# Patient Record
Sex: Male | Born: 1990 | Race: White | Hispanic: No | Marital: Single | Smoking: Never smoker
Health system: Southern US, Community
[De-identification: ages and names within clinical notes are randomized; demographics above are authoritative.]

## PROBLEM LIST (undated history)

## (undated) VITALS — BP 137/96 | HR 92 | Temp 97.9°F | Resp 18 | Ht 73.0 in | Wt 214.0 lb

## (undated) DIAGNOSIS — T1491XA Suicide attempt, initial encounter: Secondary | ICD-10-CM

## (undated) DIAGNOSIS — U071 COVID-19: Secondary | ICD-10-CM

## (undated) DIAGNOSIS — I10 Essential (primary) hypertension: Secondary | ICD-10-CM

## (undated) DIAGNOSIS — F1020 Alcohol dependence, uncomplicated: Secondary | ICD-10-CM

## (undated) DIAGNOSIS — F419 Anxiety disorder, unspecified: Secondary | ICD-10-CM

## (undated) HISTORY — DX: Alcohol dependence, uncomplicated: F10.20

---

## 2011-09-18 ENCOUNTER — Emergency Department (HOSPITAL_COMMUNITY)
Admission: EM | Admit: 2011-09-18 | Discharge: 2011-09-19 | Disposition: A | Discharge status: Home / Self Care | Payer: BC Managed Care – PPO | Attending: Emergency Medicine | Admitting: Emergency Medicine

## 2011-09-18 DIAGNOSIS — F10229 Alcohol dependence with intoxication, unspecified: Secondary | ICD-10-CM | POA: Insufficient documentation

## 2011-09-18 DIAGNOSIS — F411 Generalized anxiety disorder: Secondary | ICD-10-CM | POA: Insufficient documentation

## 2011-09-18 DIAGNOSIS — J45909 Unspecified asthma, uncomplicated: Secondary | ICD-10-CM | POA: Insufficient documentation

## 2011-09-18 LAB — RAPID URINE DRUG SCREEN, HOSP PERFORMED
Barbiturates: NOT DETECTED
Benzodiazepines: NOT DETECTED
Cocaine: NOT DETECTED
Opiates: NOT DETECTED

## 2011-09-18 LAB — COMPREHENSIVE METABOLIC PANEL
Albumin: 4.1 g/dL (ref 3.5–5.2)
Alkaline Phosphatase: 56 U/L (ref 39–117)
BUN: 10 mg/dL (ref 6–23)
Calcium: 9.4 mg/dL (ref 8.4–10.5)
Potassium: 3.9 mEq/L (ref 3.5–5.1)
Sodium: 140 mEq/L (ref 135–145)
Total Protein: 7.9 g/dL (ref 6.0–8.3)

## 2011-09-18 LAB — DIFFERENTIAL
Basophils Absolute: 0 10*3/uL (ref 0.0–0.1)
Basophils Relative: 0 % (ref 0–1)
Eosinophils Relative: 0 % (ref 0–5)
Monocytes Absolute: 0.5 10*3/uL (ref 0.1–1.0)

## 2011-09-18 LAB — ETHANOL: Alcohol, Ethyl (B): 174 mg/dL — ABNORMAL HIGH (ref 0–11)

## 2011-09-18 LAB — CBC
MCH: 32.1 pg (ref 26.0–34.0)
MCHC: 36.3 g/dL — ABNORMAL HIGH (ref 30.0–36.0)
RDW: 12.8 % (ref 11.5–15.5)

## 2011-09-20 ENCOUNTER — Inpatient Hospital Stay (HOSPITAL_COMMUNITY)
Admission: RE | Admit: 2011-09-20 | Discharge: 2011-09-22 | DRG: 897 | Disposition: A | Discharge status: Home / Self Care | Payer: No Typology Code available for payment source | Source: Ambulatory Visit | Attending: Psychiatry | Admitting: Psychiatry

## 2011-09-20 DIAGNOSIS — F1020 Alcohol dependence, uncomplicated: Secondary | ICD-10-CM

## 2011-09-20 DIAGNOSIS — Z560 Unemployment, unspecified: Secondary | ICD-10-CM

## 2011-09-20 DIAGNOSIS — R030 Elevated blood-pressure reading, without diagnosis of hypertension: Secondary | ICD-10-CM

## 2011-09-20 DIAGNOSIS — R7401 Elevation of levels of liver transaminase levels: Secondary | ICD-10-CM

## 2011-09-20 DIAGNOSIS — Z6379 Other stressful life events affecting family and household: Secondary | ICD-10-CM

## 2011-09-20 DIAGNOSIS — R7402 Elevation of levels of lactic acid dehydrogenase (LDH): Secondary | ICD-10-CM

## 2011-09-22 NOTE — Assessment & Plan Note (Signed)
[name], [name] NO.:  [id number]  MEDICAL RECORD NO.:  [id number]  LOCATION:  0303                          FACILITY:  [facility]  PHYSICIAN:  Orson Aloe, MD       DATE OF BIRTH:  12/29/1990  DATE OF ADMISSION:  09/20/2011 DATE OF DISCHARGE:                      PSYCHIATRIC ADMISSION ASSESSMENT   IDENTIFYING INFORMATION:  Single 20 year old male.  This is a voluntary admission.  CHIEF COMPLAINT:  "Want to stop drinking."  HISTORY OF PRESENT ILLNESS:  This is the first inpatient admission and 1st detox for [name] who reports that he has been drinking pretty regularly for about 2 years now.  He drinks daily most days of the week, has lost his job, feels that the alcohol affects his mood by amplifying whatever mood he happens to be in, based on the current circumstances. No recent legal charges.  He denies any suicidal thoughts.  His typical day involves drinking a 4 loco alcohol drink in the afternoon, then ends up going to the bar with his friends where he drinks 6-7 beers along with some shots.  If they are not at the bar, they will typically split a 5th of vodka.  His last drink was on the morning of October 13 which was the day of presentation in the emergency room.  He presented with an alcohol level of 174.  At that time, his pulse was elevated at 120 beats per minute, blood pressure 154/94.  [name] says that he tried to stopped drinking on his own, but gets rapid heart rate, very flushed, feeling cold and hot all at the same time, very anxious and jittery, and that frightened him when he started drinking again.  PAST PSYCHIATRIC HISTORY:  He has been followed by his primary care physician, Dr. Tana Coast, MD.  He was previously treated with Zoloft for anxiety that he felt was quite effective.  He eventually had been off of it for about a year, went to his doctor and has gotten back on it.  He is now just taking 25 mg with plans to titrate  up.  SOCIAL HISTORY:  Single Caucasian male who is currently unemployed. Currently, lives with a friend and plans on moving in with his girlfriend who drinks only occasionally.  He reports that he does have adequate support group of friends, some of whom do drink and plenty of others who do not use alcohol on a regular basis.  He has thought through what he needs to avoid alcohol and plans on avoiding the bar scene and changing friends that he is currently involved with.  SOCIAL HISTORY:  No current legal charges.  FAMILY HISTORY:  Many members of his mother's family drinking heavily.  ALCOHOL AND DRUG HISTORY:  He denies any substance abuse other than the above.  No use of benzodiazepines.  MEDICAL HISTORY:  Primary care physician is Tana Coast, MD.  MEDICAL PROBLEMS:  Rule out elevated blood pressure secondary to alcohol withdrawal and rule out alcohol-induced transaminitis.  PAST SURGERY HISTORY:  Significant for no seizures, no surgeries.  CURRENT MEDICATIONS:  Zoloft 50 mg daily.  DRUG ALLERGIES:  none.  PHYSICAL EXAM:  Done in the emergency room  and is noted in the record, along with a full review of systems.  Today, he denies any feelings of anxiety.  He is receiving a detox medication and received 2 mg of Ativan in the emergency room.  Admitting vital signs:  Temp 97.9, pulse 91, respirations 16, blood pressure 143/93.  Initial admitting vital signs pulse was 120, blood pressure 154/94.  He weighs 74 kg and is 6 feet tall.  CBC is normal.  Platelets 171.  Normal electrolytes.  Liver enzymes are mildly elevated with SGOT of 71, SGPT 33, alkaline phosphatase 56, total bilirubin is 1.0.  Lipase negative at 41.  Urine drug screen negative for all substances.  MENTAL STATUS EXAM:  Fully alert male pleasant, cooperative, good eye contact, dressed appropriately.  He does have a many colorful, elaborate tattoos over many surfaces.  Calm and cooperative.  Gives a  coherent history.  Affect is a bit the blunt.  Speech is soft, but coherent, logical, no evidence of delirium or confusion.  Has been thinking through how to stay off the alcohol.  Says that he does plan for sobriety.  Denies that he has had any current or past suicidal thoughts. Insight is adequate.  Impulse control and judgment normal.  ADMISSION DIAGNOSES:  AXIS I:  Alcohol dependence. AXIS II:  Deferred. AXIS III:  Rule out elevated blood pressure secondary to alcohol abuse, rule out alcohol-induced transaminitis. AXIS IV:  Problem with joblessness. AXIS V:  Current is 46, past year not known.  PLAN:  Voluntarily admit him with a goal of a safe detox in 4 days and we have started him on a Librium detox protocol and explained the use of the p.r.n. medications and the course of the protocol.  He is interested in hearing more about community support systems for sobriety and plans on returning to his own apartment.  Participation in group therapy has been good.     Margaret A. Lorin Picket, N.P.   ______________________________ Orson Aloe, MD    MAS/MEDQ  D:  09/20/2011  T:  09/21/2011  Job:  [id number]  Electronically Signed by Kari Baars N.P. on 09/21/2011 01:06:07 PM Electronically Signed by Orson Aloe  on 09/22/2011 04:04:20 PM

## 2011-09-24 ENCOUNTER — Other Ambulatory Visit (HOSPITAL_COMMUNITY): Payer: No Typology Code available for payment source | Attending: Physician Assistant

## 2011-09-24 DIAGNOSIS — F1020 Alcohol dependence, uncomplicated: Secondary | ICD-10-CM | POA: Insufficient documentation

## 2011-09-24 NOTE — Discharge Summary (Signed)
NAMEKENTARO, ALEWINE                ACCOUNT NO.:  [id number]  MEDICAL RECORD NO.:  [id number]  LOCATION:  0303                          FACILITY:  [facility]  PHYSICIAN:  Orson Aloe, MD       DATE OF BIRTH:  04/03/1991  DATE OF ADMISSION:  09/19/2011 DATE OF DISCHARGE:  09/22/2011                              DISCHARGE SUMMARY   IDENTIFYING INFORMATION:  This is a 20 year old male.  This is a voluntary admission.  HISTORY OF THE PRESENT ILLNESS:  This is the first inpatient psychiatric admission and first detox for Dollie who reported that he had been drinking alcohol regularly for about 2 years now.  He says he drinks alcohol most days of the week and has lost his job and feels that the alcohol is also affecting his mood.  It tends to amplify whatever mood he happens to be in at the time he is drinking.  He regularly drinks to intoxication and will typically drink a Four Loko drink in the afternoon then goes to the bar with his friends where he drinks 6-7 beers along with some shots.  If they are not at a bar, they will typically split a 5th of vodka.  He presented with an alcohol level of 174 mg/dL on the afternoon of presentation after drinking in the morning.  At that time, pulse was elevated at 120 beats per minute, blood pressure 154/94. Palmer says that he tries to stop drinking on his own but gets a rapid heart rate, feeling very flushed, cold and hot all at the same time, very anxious and jittery.  He feels that the alcohol is affecting him physically, and this is another reason for pursuing sobriety.  He endorses a history of anxiety that was previously treated with Zoloft, and he was recently started back on it, taking 25 mg daily.  Denies any suicidal thoughts.  MEDICAL EVALUATION:  He was medically evaluated in the emergency room, and full physical exam and review of systems is noted in the record.  MEDICAL PROBLEMS AT THE TIME OF ADMISSION:  Included elevated  blood pressure and pulse as noted above and mildly elevated liver enzymes.  He weighs 74 kg, 6 feet tall.  CBC was normal, platelets 171,000. Electrolytes normal.  Liver enzymes mildly elevated with an SGOT of 71, SGPT of 33, alkaline phosphatase of 56 and total bilirubin of 1.0. Urine drug screen negative for all substances.  COURSE OF HOSPITALIZATION:  He was admitted to our detox unit and started on a Librium protocol with the goal of a safe detox in 4 days. His detox was uneventful.  Following initiation of the Librium, his blood pressure was down to 127/86 and pulse down to 90.  He tolerated the Librium well.  Participation in group therapy and unit activities was appropriate.  He was ready for discharge by the 17th of October.  He continues to consistently deny any dangerous ideas.  FOLLOWUP PLAN:  He will follow up with the intensive outpatient program for chemical dependency here at [facility] on October the 18th at 9 a.m.  DISCHARGE MEDICATIONS:  Librium 25 mg take 1 at bedtime on October 17th and  1 at bedtime on October 18th, and he is finished with detox.  DISCHARGE DIAGNOSES:  Axis I:  Alcohol dependence. Axis II:  Deferred. Axis III.  Mildly elevated transaminases attributed to alcohol abuse. Axis IV:  Moderate psychosocial issues. Axis V:  Current 55, past year 65 estimated.     Margaret A. Lorin Picket, N.P.   ______________________________ Orson Aloe, MD    MAS/MEDQ  D:  09/22/2011  T:  09/22/2011  Job:  [id number]  Electronically Signed by Kari Baars N.P. on 09/23/2011 08:27:42 AM Electronically Signed by Orson Aloe  on 09/24/2011 10:22:58 AM

## 2011-09-27 ENCOUNTER — Other Ambulatory Visit (HOSPITAL_COMMUNITY): Payer: No Typology Code available for payment source

## 2011-09-29 ENCOUNTER — Other Ambulatory Visit (HOSPITAL_COMMUNITY): Payer: No Typology Code available for payment source

## 2011-10-01 ENCOUNTER — Other Ambulatory Visit (HOSPITAL_COMMUNITY): Payer: No Typology Code available for payment source

## 2011-10-04 ENCOUNTER — Other Ambulatory Visit (HOSPITAL_COMMUNITY): Payer: No Typology Code available for payment source

## 2011-10-06 ENCOUNTER — Other Ambulatory Visit (HOSPITAL_COMMUNITY): Payer: No Typology Code available for payment source

## 2011-10-08 ENCOUNTER — Other Ambulatory Visit (HOSPITAL_COMMUNITY): Payer: BC Managed Care – PPO | Attending: Physician Assistant

## 2011-10-11 ENCOUNTER — Other Ambulatory Visit (HOSPITAL_COMMUNITY): Payer: BC Managed Care – PPO | Admitting: *Deleted

## 2011-10-11 NOTE — Progress Notes (Signed)
  Patient was contacted by phone on 10/06/11 regarding his absence from group and his no-show for individual session. He was also contacted by Charmian Muff, LCAS on 10/08/11 regarding his absence.

## 2011-10-11 NOTE — Progress Notes (Deleted)
    Daily Group Progress Note  Program: {CHL AMB [facility] IOP/CDIOP Program Type:21022744}  Group Time:   Participation Level: {CHL AMB [facility] Group Participation:21022742}  Behavioral Response: {CHL AMB [facility] Group Behavior:21022743}  Type of Therapy:  {CHL AMB [facility] Type of Therapy:21022741}  Summary of Progress: ***     Group Time:   Participation Level:  {CHL AMB [facility] Group Participation:21022742}  Behavioral Response: {CHL AMB [facility] Group Behavior:21022743}  Type of Therapy: {CHL AMB BH Type of Therapy:21022741}  Summary of Progress: ***  Aris Lot, COUNS

## 2011-10-13 ENCOUNTER — Other Ambulatory Visit (HOSPITAL_COMMUNITY): Payer: BC Managed Care – PPO | Admitting: *Deleted

## 2011-10-14 ENCOUNTER — Encounter (HOSPITAL_COMMUNITY): Payer: Self-pay | Admitting: *Deleted

## 2011-10-14 NOTE — Progress Notes (Signed)
  Pt discharged from Chemical Dependency Program due to non-compliance. He no-showed 4 groups in a row and did not return calls regarding absence.

## 2011-10-15 ENCOUNTER — Other Ambulatory Visit (HOSPITAL_COMMUNITY): Payer: BC Managed Care – PPO

## 2011-10-18 ENCOUNTER — Other Ambulatory Visit (HOSPITAL_COMMUNITY): Payer: BC Managed Care – PPO

## 2011-10-20 ENCOUNTER — Other Ambulatory Visit (HOSPITAL_COMMUNITY): Payer: BC Managed Care – PPO

## 2011-10-22 ENCOUNTER — Other Ambulatory Visit (HOSPITAL_COMMUNITY): Payer: BC Managed Care – PPO

## 2011-10-25 ENCOUNTER — Other Ambulatory Visit (HOSPITAL_COMMUNITY): Payer: BC Managed Care – PPO

## 2011-10-27 ENCOUNTER — Other Ambulatory Visit (HOSPITAL_COMMUNITY): Payer: BC Managed Care – PPO

## 2011-10-29 ENCOUNTER — Other Ambulatory Visit (HOSPITAL_COMMUNITY): Payer: BC Managed Care – PPO

## 2011-11-01 ENCOUNTER — Other Ambulatory Visit (HOSPITAL_COMMUNITY): Payer: BC Managed Care – PPO

## 2011-11-03 ENCOUNTER — Other Ambulatory Visit (HOSPITAL_COMMUNITY): Payer: BC Managed Care – PPO

## 2011-11-05 ENCOUNTER — Other Ambulatory Visit (HOSPITAL_COMMUNITY): Payer: BC Managed Care – PPO

## 2011-11-08 ENCOUNTER — Other Ambulatory Visit (HOSPITAL_COMMUNITY): Payer: BC Managed Care – PPO | Attending: Physician Assistant

## 2011-11-10 ENCOUNTER — Other Ambulatory Visit (HOSPITAL_COMMUNITY): Payer: BC Managed Care – PPO

## 2011-11-12 ENCOUNTER — Other Ambulatory Visit (HOSPITAL_COMMUNITY): Payer: BC Managed Care – PPO

## 2011-11-15 ENCOUNTER — Other Ambulatory Visit (HOSPITAL_COMMUNITY): Payer: BC Managed Care – PPO

## 2011-11-17 ENCOUNTER — Other Ambulatory Visit (HOSPITAL_COMMUNITY): Payer: BC Managed Care – PPO

## 2018-09-14 ENCOUNTER — Other Ambulatory Visit: Payer: Self-pay

## 2018-09-14 ENCOUNTER — Observation Stay (HOSPITAL_COMMUNITY)
Admission: EM | Admit: 2018-09-14 | Discharge: 2018-09-16 | Disposition: A | Discharge status: Home / Self Care | Payer: Self-pay | Attending: Internal Medicine | Admitting: Internal Medicine

## 2018-09-14 ENCOUNTER — Encounter (HOSPITAL_COMMUNITY): Payer: Self-pay | Admitting: *Deleted

## 2018-09-14 DIAGNOSIS — F10930 Alcohol use, unspecified with withdrawal, uncomplicated: Secondary | ICD-10-CM

## 2018-09-14 DIAGNOSIS — F10229 Alcohol dependence with intoxication, unspecified: Secondary | ICD-10-CM

## 2018-09-14 DIAGNOSIS — E870 Hyperosmolality and hypernatremia: Secondary | ICD-10-CM | POA: Insufficient documentation

## 2018-09-14 DIAGNOSIS — F17290 Nicotine dependence, other tobacco product, uncomplicated: Secondary | ICD-10-CM | POA: Insufficient documentation

## 2018-09-14 DIAGNOSIS — F10932 Alcohol use, unspecified with withdrawal with perceptual disturbance: Secondary | ICD-10-CM | POA: Diagnosis present

## 2018-09-14 DIAGNOSIS — F10232 Alcohol dependence with withdrawal with perceptual disturbance: Principal | ICD-10-CM | POA: Insufficient documentation

## 2018-09-14 DIAGNOSIS — F10230 Alcohol dependence with withdrawal, uncomplicated: Secondary | ICD-10-CM

## 2018-09-14 LAB — CBC
HEMATOCRIT: 44.2 % (ref 39.0–52.0)
HEMOGLOBIN: 14.2 g/dL (ref 13.0–17.0)
MCH: 28.1 pg (ref 26.0–34.0)
MCHC: 32.1 g/dL (ref 30.0–36.0)
MCV: 87.4 fL (ref 80.0–100.0)
Platelets: 158 10*3/uL (ref 150–400)
RBC: 5.06 MIL/uL (ref 4.22–5.81)
RDW: 12.6 % (ref 11.5–15.5)
WBC: 6.7 10*3/uL (ref 4.0–10.5)
nRBC: 0 % (ref 0.0–0.2)

## 2018-09-14 LAB — COMPREHENSIVE METABOLIC PANEL
ALBUMIN: 5.2 g/dL — AB (ref 3.5–5.0)
ALT: 25 U/L (ref 0–44)
AST: 28 U/L (ref 15–41)
Alkaline Phosphatase: 55 U/L (ref 38–126)
Anion gap: 13 (ref 5–15)
BUN: 9 mg/dL (ref 6–20)
CHLORIDE: 106 mmol/L (ref 98–111)
CO2: 27 mmol/L (ref 22–32)
CREATININE: 0.91 mg/dL (ref 0.61–1.24)
Calcium: 9.8 mg/dL (ref 8.9–10.3)
GFR calc Af Amer: >60 mL/min (ref >60)
GFR calc non Af Amer: >60 mL/min (ref >60)
GLUCOSE: 100 mg/dL — AB (ref 70–99)
Potassium: 4.2 mmol/L (ref 3.5–5.1)
SODIUM: 146 mmol/L — AB (ref 135–145)
Total Bilirubin: 0.9 mg/dL (ref 0.3–1.2)
Total Protein: 8.8 g/dL — ABNORMAL HIGH (ref 6.5–8.1)

## 2018-09-14 LAB — ETHANOL: Alcohol, Ethyl (B): 231 mg/dL — ABNORMAL HIGH (ref <10)

## 2018-09-14 MED ORDER — ONDANSETRON HCL 4 MG/2ML IJ SOLN
4.0000 mg | Freq: Once | INTRAMUSCULAR | Status: AC
  Administered 2018-09-14: 4 mg via INTRAVENOUS
  Filled 2018-09-14: qty 2

## 2018-09-14 MED ORDER — LORAZEPAM 1 MG PO TABS: 0.0000 mg | ORAL_TABLET | Freq: Four times a day (QID) | ORAL | Status: DC

## 2018-09-14 MED ORDER — LORAZEPAM 2 MG/ML IJ SOLN
0.0000 mg | Freq: Four times a day (QID) | INTRAMUSCULAR | Status: DC
  Administered 2018-09-15: 1 mg via INTRAVENOUS
  Filled 2018-09-14: qty 1

## 2018-09-14 MED ORDER — THIAMINE HCL 100 MG/ML IJ SOLN
Freq: Once | INTRAVENOUS | Status: AC
  Administered 2018-09-15: 01:00:00 via INTRAVENOUS
  Filled 2018-09-14: qty 1000

## 2018-09-14 NOTE — ED Provider Notes (Signed)
[facility]-EMERGENCY DEPT Provider Note: Lowella Dell, MD, FACEP  CSN: [id number] MRN: [id number] ARRIVAL: 09/14/18 at 2155 ROOM: WA03/WA03   CHIEF COMPLAINT  Alcohol Withdrawal   HISTORY OF PRESENT ILLNESS  09/14/18 11:30 PM [name] is a 27 y.o. male with a long-standing history of alcoholism.  He has been trying to go "cold [place]" for the past 3 days.  He is complaining of feeling generally weak, anxious, having difficulty concentrating and forming thoughts, having visual hallucinations which he describes as wavy lines in his peripheral vision, nausea, vomiting, decreased oral intake and a sense of impending doom.  He has been admitted for DTs in the past, about 8 years ago.  Symptoms are presently moderate as he has been drinking occasional beers throughout the day to ease his symptoms.  He denies abdominal pain or diarrhea.   Past Medical History:  Diagnosis Date  . Alcohol dependence (HCC)     History reviewed. No pertinent surgical history.  No family history on file.  Social History   Tobacco Use  . Smoking status: Current Every Day Smoker    Types: E-cigarettes  . Smokeless tobacco: Never Used  Substance Use Topics  . Alcohol use: Yes    Alcohol/week: 70.0 standard drinks    Types: 70 Standard drinks or equivalent per week  . Drug use: Not on file    Prior to Admission medications   Medication Sig Start Date End Date Taking? Authorizing Provider  amphetamine-dextroamphetamine (ADDERALL) 10 MG tablet Take 10 mg by mouth once.    Yes [provider]    Allergies Patient has no known allergies.   REVIEW OF SYSTEMS  Negative except as noted here or in the History of Present Illness.   PHYSICAL EXAMINATION  Initial Vital Signs Blood pressure (!) 138/96, pulse 92, height 6\' 1"  (1.854 m), weight 83.6 kg.  Examination General: Well-developed, well-nourished male in no acute distress; appearance consistent with age of record HENT: normocephalic;  atraumatic; breath smells of alcohol Eyes: pupils equal, round and reactive to light; extraocular muscles intact Neck: supple Heart: regular rate and rhythm Lungs: clear to auscultation bilaterally Abdomen: soft; nondistended; nontender; no masses or hepatosplenomegaly; bowel sounds present Extremities: No deformity; full range of motion; pulses normal Neurologic: Awake, alert and oriented; motor function intact in all extremities and symmetric; no facial droop Skin: Warm and dry Psychiatric: Flat affect   RESULTS  Summary of this visit's results, reviewed by myself:   EKG Interpretation  Date/Time:    Ventricular Rate:    PR Interval:    QRS Duration:   QT Interval:    QTC Calculation:   R Axis:     Text Interpretation:        Laboratory Studies: Results for orders placed or performed during the hospital encounter of 09/14/18 (from the past 24 hour(s))  Comprehensive metabolic panel     Status: Abnormal   Collection Time: 09/14/18 11:11 PM  Result Value Ref Range   Sodium 146 (H) 135 - 145 mmol/L   Potassium 4.2 3.5 - 5.1 mmol/L   Chloride 106 98 - 111 mmol/L   CO2 27 22 - 32 mmol/L   Glucose, Bld 100 (H) 70 - 99 mg/dL   BUN 9 6 - 20 mg/dL   Creatinine, Ser 7.82 0.61 - 1.24 mg/dL   Calcium 9.8 8.9 - 95.6 mg/dL   Total Protein 8.8 (H) 6.5 - 8.1 g/dL   Albumin 5.2 (H) 3.5 - 5.0 g/dL   AST  28 15 - 41 U/L   ALT 25 0 - 44 U/L   Alkaline Phosphatase 55 38 - 126 U/L   Total Bilirubin 0.9 0.3 - 1.2 mg/dL   GFR calc non Af Amer >60 >60 mL/min   GFR calc Af Amer >60 >60 mL/min   Anion gap 13 5 - 15  Ethanol     Status: Abnormal   Collection Time: 09/14/18 11:11 PM  Result Value Ref Range   Alcohol, Ethyl (B) 231 (H) <10 mg/dL  cbc     Status: None   Collection Time: 09/14/18 11:11 PM  Result Value Ref Range   WBC 6.7 4.0 - 10.5 K/uL   RBC 5.06 4.22 - 5.81 MIL/uL   Hemoglobin 14.2 13.0 - 17.0 g/dL   HCT 66.4 40.3 - 47.4 %   MCV 87.4 80.0 - 100.0 fL   MCH 28.1 26.0  - 34.0 pg   MCHC 32.1 30.0 - 36.0 g/dL   RDW 25.9 56.3 - 87.5 %   Platelets 158 150 - 400 K/uL   nRBC 0.0 0.0 - 0.2 %  Rapid urine drug screen (hospital performed)     Status: Abnormal   Collection Time: 09/15/18 12:40 AM  Result Value Ref Range   Opiates NONE DETECTED NONE DETECTED   Cocaine NONE DETECTED NONE DETECTED   Benzodiazepines NONE DETECTED NONE DETECTED   Amphetamines POSITIVE (A) NONE DETECTED   Tetrahydrocannabinol NONE DETECTED NONE DETECTED   Barbiturates NONE DETECTED NONE DETECTED   Imaging Studies: No results found.  ED COURSE and MDM  Nursing notes and initial vitals signs, including pulse oximetry, reviewed.  Vitals:   09/14/18 2255 09/14/18 2301 09/15/18 0025 09/15/18 0030  BP:  (!) 138/96 130/87 119/83  Pulse:  92 81 83  Resp:    19  SpO2:    98%  Weight: 83.6 kg     Height: 6\' 1"  (1.854 m)      1:24 AM Patient is not in frank DTs at the present time but his alcohol is 231 and I am concerned that he will go into DTs as his alcohol is metabolized.  I will consult the hospitalist for admission.  PROCEDURES    ED DIAGNOSES     ICD-10-CM   1. Alcohol withdrawal syndrome without complication (HCC) F10.230   2. Alcohol intoxication in active alcoholic with complication (HCC) I43.329        Paula Libra, MD 09/15/18 [phone]

## 2018-09-14 NOTE — ED Triage Notes (Signed)
Pt reports withdrawal sxs from etoh.  Last drink was about 5 hours ago.  Normally drinks more than 12 beers daily.  He reports panic attacks and confusion today.  He has not been able to eat or drink/.  He is A&Ox 4.  Ambulatory without difficulty.

## 2018-09-15 ENCOUNTER — Encounter (HOSPITAL_COMMUNITY): Payer: Self-pay | Admitting: Family Medicine

## 2018-09-15 ENCOUNTER — Other Ambulatory Visit: Payer: Self-pay

## 2018-09-15 DIAGNOSIS — F10232 Alcohol dependence with withdrawal with perceptual disturbance: Secondary | ICD-10-CM

## 2018-09-15 DIAGNOSIS — F10932 Alcohol use, unspecified with withdrawal with perceptual disturbance: Secondary | ICD-10-CM | POA: Diagnosis present

## 2018-09-15 LAB — COMPREHENSIVE METABOLIC PANEL
ALBUMIN: 4 g/dL (ref 3.5–5.0)
ALT: 22 U/L (ref 0–44)
ANION GAP: 10 (ref 5–15)
AST: 24 U/L (ref 15–41)
Alkaline Phosphatase: 46 U/L (ref 38–126)
BILIRUBIN TOTAL: 0.9 mg/dL (ref 0.3–1.2)
BUN: 9 mg/dL (ref 6–20)
CALCIUM: 8.9 mg/dL (ref 8.9–10.3)
CO2: 26 mmol/L (ref 22–32)
Chloride: 109 mmol/L (ref 98–111)
Creatinine, Ser: 0.89 mg/dL (ref 0.61–1.24)
GFR calc Af Amer: >60 mL/min (ref >60)
GFR calc non Af Amer: >60 mL/min (ref >60)
GLUCOSE: 84 mg/dL (ref 70–99)
POTASSIUM: 4.1 mmol/L (ref 3.5–5.1)
SODIUM: 145 mmol/L (ref 135–145)
Total Protein: 7.2 g/dL (ref 6.5–8.1)

## 2018-09-15 LAB — RAPID URINE DRUG SCREEN, HOSP PERFORMED
Amphetamines: POSITIVE — AB
Barbiturates: NOT DETECTED
Benzodiazepines: NOT DETECTED
Cocaine: NOT DETECTED
OPIATES: NOT DETECTED
Tetrahydrocannabinol: NOT DETECTED

## 2018-09-15 LAB — HIV ANTIBODY (ROUTINE TESTING W REFLEX): HIV Screen 4th Generation wRfx: NONREACTIVE

## 2018-09-15 MED ORDER — VITAMIN B-1 100 MG PO TABS
100.0000 mg | ORAL_TABLET | Freq: Every day | ORAL | Status: DC
  Administered 2018-09-15 – 2018-09-16 (×2): 100 mg via ORAL
  Filled 2018-09-15 (×2): qty 1

## 2018-09-15 MED ORDER — ACETAMINOPHEN 325 MG PO TABS: 650.0000 mg | ORAL_TABLET | Freq: Four times a day (QID) | ORAL | Status: DC | PRN

## 2018-09-15 MED ORDER — THIAMINE HCL 100 MG/ML IJ SOLN: 100.0000 mg | Freq: Every day | INTRAMUSCULAR | Status: DC

## 2018-09-15 MED ORDER — IBUPROFEN 200 MG PO TABS: 400.0000 mg | ORAL_TABLET | Freq: Four times a day (QID) | ORAL | Status: DC | PRN

## 2018-09-15 MED ORDER — SENNOSIDES-DOCUSATE SODIUM 8.6-50 MG PO TABS: 1.0000 | ORAL_TABLET | Freq: Every evening | ORAL | Status: DC | PRN

## 2018-09-15 MED ORDER — ADULT MULTIVITAMIN W/MINERALS CH
1.0000 | ORAL_TABLET | Freq: Every day | ORAL | Status: DC
  Administered 2018-09-15 – 2018-09-16 (×2): 1 via ORAL
  Filled 2018-09-15 (×2): qty 1

## 2018-09-15 MED ORDER — ONDANSETRON HCL 4 MG/2ML IJ SOLN: 4.0000 mg | Freq: Four times a day (QID) | INTRAMUSCULAR | Status: DC | PRN

## 2018-09-15 MED ORDER — LORAZEPAM 2 MG/ML IJ SOLN: 1.0000 mg | Freq: Four times a day (QID) | INTRAMUSCULAR | Status: DC | PRN

## 2018-09-15 MED ORDER — LORAZEPAM 2 MG/ML IJ SOLN: 0.0000 mg | Freq: Two times a day (BID) | INTRAMUSCULAR | Status: DC

## 2018-09-15 MED ORDER — SODIUM CHLORIDE 0.45 % IV SOLN
INTRAVENOUS | Status: AC
  Administered 2018-09-15: 03:00:00 via INTRAVENOUS

## 2018-09-15 MED ORDER — FOLIC ACID 1 MG PO TABS
1.0000 mg | ORAL_TABLET | Freq: Every day | ORAL | Status: DC
  Administered 2018-09-15 – 2018-09-16 (×2): 1 mg via ORAL
  Filled 2018-09-15 (×2): qty 1

## 2018-09-15 MED ORDER — ENOXAPARIN SODIUM 40 MG/0.4ML ~~LOC~~ SOLN
40.0000 mg | SUBCUTANEOUS | Status: DC
  Filled 2018-09-15: qty 0.4

## 2018-09-15 MED ORDER — LORAZEPAM 1 MG PO TABS
1.0000 mg | ORAL_TABLET | Freq: Four times a day (QID) | ORAL | Status: DC | PRN
  Administered 2018-09-15 – 2018-09-16 (×4): 1 mg via ORAL
  Filled 2018-09-15 (×4): qty 1

## 2018-09-15 MED ORDER — ONDANSETRON HCL 4 MG PO TABS: 4.0000 mg | ORAL_TABLET | Freq: Four times a day (QID) | ORAL | Status: DC | PRN

## 2018-09-15 MED ORDER — ACETAMINOPHEN 650 MG RE SUPP: 650.0000 mg | Freq: Four times a day (QID) | RECTAL | Status: DC | PRN

## 2018-09-15 MED ORDER — LORAZEPAM 2 MG/ML IJ SOLN
0.0000 mg | Freq: Four times a day (QID) | INTRAMUSCULAR | Status: DC
  Administered 2018-09-15 (×2): 1 mg via INTRAVENOUS
  Administered 2018-09-16: 2 mg via INTRAVENOUS
  Filled 2018-09-15 (×3): qty 1

## 2018-09-15 NOTE — Clinical Social Work Note (Addendum)
Clinical Social Work Assessment  Patient Details  Name: [name] MRN: [id number] Date of Birth: 20-Apr-1991  Date of referral:  09/15/18               Reason for consult:  Substance Use/ETOH Abuse                Permission sought to share information with:    Permission granted to share information::  No  Name::        Agency::     Relationship::     Contact Information:     Housing/Transportation Living arrangements for the past 2 months:  Single Family Home Source of Information:  Patient Patient Interpreter Needed:  None Criminal Activity/Legal Involvement Pertinent to Current Situation/Hospitalization:  No - Comment as needed Significant Relationships:  Parents, Significant Other Lives with:    Do you feel safe going back to the place where you live?  Yes Need for family participation in patient care:  No (Coment)  Care giving concerns:  Patient has long history of ETOH abuse and desires to quit.   Social Worker assessment / plan:  CSW met with patient at beside to discuss patient's current ETOH abuse. Patient reports he has long history of ETOH abuse and desires to quit drinking altogether. He expresses being tired of consuming at the level he currently does and wants to stop.  Patient has been to residential treatment before for "6-7 days" and reports it was helpful at the time but that was multiple years ago. He could not report what has helped him in sobriety previously.   Patient appears motivated to make a change in ETOH consumption.  CSW discussed residential treatment, outpatient treatment, and provided list of AA meetings to patient. CSW alerted patient of treatment options that take patients with no insurance.   Patient was appreciative and had no additional questions.   Employment status:    Insurance information:  Self Pay (Medicaid Pending) PT Recommendations:  Not assessed at this time Information / Referral to community resources:  Residential Substance  Abuse Treatment Options, Outpatient Substance Abuse Treatment Options  Patient/Family's Response to care: Patient was receptive to CSW and accepting of substance abuse resources.  Patient/Family's Understanding of and Emotional Response to Diagnosis, Current Treatment, and Prognosis:  Patient understands treatment options and that he should call and reach out to treatment facilities that he is interested in. CSW provided list with information for each option.  Emotional Assessment Appearance:  Appears stated age Attitude/Demeanor/Rapport:    Affect (typically observed):  Accepting Orientation:  Oriented to  Time, Oriented to Situation, Oriented to Place, Oriented to Self Alcohol / Substance use:  Alcohol Use Psych involvement (Current and /or in the community):  No (Comment)  Discharge Needs  Concerns to be addressed:  Substance Abuse Concerns Readmission within the last 30 days:  No Current discharge risk:  Substance Abuse Barriers to Discharge:  Active Substance Use   Pricilla Holm, LCSWA 09/15/2018, 11:20 AM

## 2018-09-15 NOTE — H&P (Signed)
History and Physical    [name] CHY:[id number] DOB: [birth year] DOA: 09/14/2018  PCP: Arman Filter, FNP   Patient coming from: Home   Chief Complaint: Anxiety, N/V, hallucinations   HPI: [name] is a 27 y.o. male with medical history significant for alcohol dependence with history of withdrawal seizure, now presenting to the emergency department with anxiety, nausea with vomiting, and visual hallucinations in the setting of trying to cut back on his alcohol intake.  Patient reports that he has been drinking excessively for roughly 10 years, had been able to quit previously, but also reports a history of withdrawal seizure.  Reports that he would like to cut back on his alcohol intake, but his attempts have been met with panic attacks, nausea with nonbloody vomiting, and he has also been experiencing some visual hallucinations today, described as seeing wavy lines.  He denies any recent fevers, chills, or chest pains.  Denies shortness of breath or cough.  Denies illicit drug use per se, but some Adderall obtained from a friend recently.  ED Course: Upon arrival to the ED, patient is found to be saturating well on room air, and with vitals otherwise stable.  Chemistry panel is notable for slight hypernatremia and elevations in albumin and serum protein.  CBC is unremarkable.  UDS is positive for methamphetamine.  Ethanol level is 231.  Patient was given IV fluids and Ativan in the ED.  He reports some improvement with this, but remains anxious and tremulous and he will be observed in the hospital for ongoing evaluation and management of alcohol withdrawal.  Review of Systems:  All other systems reviewed and apart from HPI, are negative.  Past Medical History:  Diagnosis Date  . Alcohol dependence ([facility])     History reviewed. No pertinent surgical history.   reports that he has been smoking e-cigarettes. He has never used smokeless tobacco. He reports that he drinks about  70.0 standard drinks of alcohol per week. His drug history is not on file.  No Known Allergies  History reviewed. No pertinent family history.   Prior to Admission medications   Medication Sig Start Date End Date Taking? Authorizing Provider  amphetamine-dextroamphetamine (ADDERALL) 10 MG tablet Take 10 mg by mouth once.    Yes [provider]    Physical Exam: Vitals:   09/14/18 2255 09/14/18 2301 09/15/18 0025 09/15/18 0030  BP:  (!) 138/96 130/87 119/83  Pulse:  92 81 83  Resp:    19  SpO2:    98%  Weight: 83.6 kg     Height: 6' 1"  (1.854 m)       Constitutional: NAD, tremulous  Eyes: PERTLA, lids and conjunctivae normal ENMT: Mucous membranes are moist. Posterior pharynx clear of any exudate or lesions.   Neck: normal, supple, no masses, no thyromegaly Respiratory: clear to auscultation bilaterally, no wheezing, no crackles. Normal respiratory effort.  .  Cardiovascular: S1 & S2 heard, regular rate and rhythm. No extremity edema. Hyperdynamic precordium. Abdomen: No distension, no tenderness, soft. Bowel sounds normal.  Musculoskeletal: no clubbing / cyanosis. No joint deformity upper and lower extremities.    Skin: no significant rashes, lesions, ulcers. Warm, dry, well-perfused. Neurologic: CN 2-12 grossly intact. Sensation intact. Strength 5/5 in all 4 limbs. Resting tremor.  Psychiatric: Alert and oriented x 3. Anxious, but pleasant and cooperative.    Labs on Admission: I have personally reviewed following labs and imaging studies  CBC: Recent Labs  Lab 09/14/18 2311  WBC 6.7  HGB 14.2  HCT 44.2  MCV 87.4  PLT 514   Basic Metabolic Panel: Recent Labs  Lab 09/14/18 2311  NA 146*  K 4.2  CL 106  CO2 27  GLUCOSE 100*  BUN 9  CREATININE 0.91  CALCIUM 9.8   GFR: Estimated Creatinine Clearance: 136.6 mL/min (by C-G formula based on SCr of 0.91 mg/dL). Liver Function Tests: Recent Labs  Lab 09/14/18 2311  AST 28  ALT 25  ALKPHOS 55    BILITOT 0.9  PROT 8.8*  ALBUMIN 5.2*   No results for input(s): LIPASE, AMYLASE in the last 168 hours. No results for input(s): AMMONIA in the last 168 hours. Coagulation Profile: No results for input(s): INR, PROTIME in the last 168 hours. Cardiac Enzymes: No results for input(s): CKTOTAL, CKMB, CKMBINDEX, TROPONINI in the last 168 hours. BNP (last 3 results) No results for input(s): PROBNP in the last 8760 hours. HbA1C: No results for input(s): HGBA1C in the last 72 hours. CBG: No results for input(s): GLUCAP in the last 168 hours. Lipid Profile: No results for input(s): CHOL, HDL, LDLCALC, TRIG, CHOLHDL, LDLDIRECT in the last 72 hours. Thyroid Function Tests: No results for input(s): TSH, T4TOTAL, FREET4, T3FREE, THYROIDAB in the last 72 hours. Anemia Panel: No results for input(s): VITAMINB12, FOLATE, FERRITIN, TIBC, IRON, RETICCTPCT in the last 72 hours. Urine analysis: No results found for: COLORURINE, APPEARANCEUR, LABSPEC, PHURINE, GLUCOSEU, HGBUR, BILIRUBINUR, KETONESUR, PROTEINUR, UROBILINOGEN, NITRITE, LEUKOCYTESUR Sepsis Labs: @LABRCNTIP (procalcitonin:4,lacticidven:4) )No results found for this or any previous visit (from the past 240 hour(s)).   Radiological Exams on Admission: No results found.  EKG: Not performed.   Assessment/Plan   1. Alcohol abuse with withdrawal  - Pt reports long hx of alcohol dependence with hx of withdrawal seizure, now presents with anxiety, tremor, N/V, and visual hallucinations in setting of trying to cut back on EtOH intake  - Reports feeling better after Ativan and IVF in ED, but remains anxious and tremulous  - Continue to monitor with CIWA, continue Ativan, b-vitamins, IVF hydration, as-needed antiemetics    DVT prophylaxis: Lovenox  Code Status: Full  Family Communication: Discussed with patient Consults called: None Admission status: Observation     Vianne Bulls, MD [facility] Pager [phone]  If  7PM-7AM, please contact night-coverage [email or web address] Password [email or web address]  09/15/2018, 1:43 AM

## 2018-09-15 NOTE — Progress Notes (Signed)
Patient is a 27 year old male with past medical history of chronic alcohol abuse, alcohol withdrawal seizure who presents the emergency department anxiety, nausea with vomiting and visual hallucination.  Patient was admitted for alcohol intoxication, risk of withdrawal and seizures.  His ethanol level was 231 on presentation.  UDS was also positive for methamphetamine.  Patient was started on alcohol withdrawal protocol. This morning he looks comfortable.  His vitals were stable.  He denies any complaints.  Since he has risk of alcohol withdrawal seizures, we want to monitor him for 1 more night.  Continue CIWA protocol.  Patient seen by Dr. Antionette Char this morning.

## 2018-09-15 NOTE — Progress Notes (Signed)
Chaplain following due to spiritual care consult.   [name] wished to have information around advance directive.  Chaplain provided adv. Dir. Packet and education around process for completing.  [name] wishes to look over packet and will ask nursing to page when ready to notarize.    Belva Crome MDiv, [facility]

## 2018-09-15 NOTE — ED Notes (Signed)
ED TO INPATIENT HANDOFF REPORT  Name/Age/Gender [name] 27 y.o. male  Code Status    Code Status Orders  (From admission, onward)         Start     Ordered   09/15/18 0141  Full code  Continuous     09/15/18 0143        Code Status History    This patient has a current code status but no historical code status.      Home/SNF/Other Home  Chief Complaint ETOH Withdrawals  Level of Care/Admitting Diagnosis ED Disposition    ED Disposition Condition Comment   Admit  Hospital Area: [facility] [100102]  Level of Care: Med-Surg [16]  Diagnosis: Alcohol withdrawal, with perceptual disturbance [facility]) [[id number]]  Admitting Physician: Vianne Bulls [[id number]]  Attending Physician: Vianne Bulls [[id number]]  PT Class (Do Not Modify): Observation [104]  PT Acc Code (Do Not Modify): Observation [10022]       Medical History Past Medical History:  Diagnosis Date  . Alcohol dependence ([place])     Allergies No Known Allergies  IV Location/Drains/Wounds Patient Lines/Drains/Airways Status   Active Line/Drains/Airways    Name:   Placement date:   Placement time:   Site:   Days:   Peripheral IV 09/14/18 Right Antecubital   09/14/18    2358    Antecubital   1          Labs/Imaging Results for orders placed or performed during the hospital encounter of 09/14/18 (from the past 48 hour(s))  Comprehensive metabolic panel     Status: Abnormal   Collection Time: 09/14/18 11:11 PM  Result Value Ref Range   Sodium 146 (H) 135 - 145 mmol/L   Potassium 4.2 3.5 - 5.1 mmol/L   Chloride 106 98 - 111 mmol/L   CO2 27 22 - 32 mmol/L   Glucose, Bld 100 (H) 70 - 99 mg/dL   BUN 9 6 - 20 mg/dL   Creatinine, Ser 0.91 0.61 - 1.24 mg/dL   Calcium 9.8 8.9 - 10.3 mg/dL   Total Protein 8.8 (H) 6.5 - 8.1 g/dL   Albumin 5.2 (H) 3.5 - 5.0 g/dL   AST 28 15 - 41 U/L   ALT 25 0 - 44 U/L   Alkaline Phosphatase 55 38 - 126 U/L   Total Bilirubin 0.9 0.3 - 1.2 mg/dL    GFR calc non Af Amer >60 >60 mL/min   GFR calc Af Amer >60 >60 mL/min    Comment: (NOTE) The eGFR has been calculated using the CKD EPI equation. This calculation has not been validated in all clinical situations. eGFR's persistently <60 mL/min signify possible Chronic Kidney Disease.    Anion gap 13 5 - 15    Comment: Performed at [facility], [place] [address] [place], [place] [address]  Ethanol     Status: Abnormal   Collection Time: 09/14/18 11:11 PM  Result Value Ref Range   Alcohol, Ethyl (B) 231 (H) <10 mg/dL    Comment: (NOTE) Lowest detectable limit for serum alcohol is 10 mg/dL. For medical purposes only. Performed at [facility], [place] [address] [place], [place] [address]   cbc     Status: None   Collection Time: 09/14/18 11:11 PM  Result Value Ref Range   WBC 6.7 4.0 - 10.5 K/uL   RBC 5.06 4.22 - 5.81 MIL/uL   Hemoglobin 14.2 13.0 - 17.0 g/dL   HCT 44.2 39.0 -  52.0 %   MCV 87.4 80.0 - 100.0 fL   MCH 28.1 26.0 - 34.0 pg   MCHC 32.1 30.0 - 36.0 g/dL   RDW 12.6 11.5 - 15.5 %   Platelets 158 150 - 400 K/uL   nRBC 0.0 0.0 - 0.2 %    Comment: Performed at [facility], [place] [address] [place], [place] [address]  Rapid urine drug screen (hospital performed)     Status: Abnormal   Collection Time: 09/15/18 12:40 AM  Result Value Ref Range   Opiates NONE DETECTED NONE DETECTED   Cocaine NONE DETECTED NONE DETECTED   Benzodiazepines NONE DETECTED NONE DETECTED   Amphetamines POSITIVE (A) NONE DETECTED   Tetrahydrocannabinol NONE DETECTED NONE DETECTED   Barbiturates NONE DETECTED NONE DETECTED    Comment: (NOTE) DRUG SCREEN FOR MEDICAL PURPOSES ONLY.  IF CONFIRMATION IS NEEDED FOR ANY PURPOSE, NOTIFY LAB WITHIN 5 DAYS. LOWEST DETECTABLE LIMITS FOR URINE DRUG SCREEN Drug Class                     Cutoff (ng/mL) Amphetamine and metabolites    1000 Barbiturate and metabolites    200 Benzodiazepine                  191 Tricyclics and metabolites     300 Opiates and metabolites        300 Cocaine and metabolites        300 THC                            50 Performed at [facility], [place] [address] [place], [place] [address]    No results found.  Pending Labs Unresulted Labs (From admission, onward)    Start     Ordered   09/22/18 0500  Creatinine, serum  (enoxaparin (LOVENOX)    CrCl >/= 30 ml/min)  Weekly,   R    Comments:  while on enoxaparin therapy    09/15/18 0143   09/15/18 0500  HIV antibody (Routine Testing)  Tomorrow morning,   R     09/15/18 0143   09/15/18 0500  Comprehensive metabolic panel  Tomorrow morning,   R     09/15/18 0143          Vitals/Pain Today's Vitals   09/14/18 2358 09/15/18 0025 09/15/18 0030 09/15/18 0200  BP:  130/87 119/83 (!) 115/96  Pulse:  81 83 76  Resp:   19 18  SpO2:   98% 100%  Weight:      Height:      PainSc: 0-No pain       Isolation Precautions No active isolations  Medications Medications  LORazepam (ATIVAN) tablet 1 mg (has no administration in time range)    Or  LORazepam (ATIVAN) injection 1 mg (has no administration in time range)  thiamine (VITAMIN B-1) tablet 100 mg (has no administration in time range)    Or  thiamine (B-1) injection 100 mg (has no administration in time range)  folic acid (FOLVITE) tablet 1 mg (has no administration in time range)  multivitamin with minerals tablet 1 tablet (has no administration in time range)  LORazepam (ATIVAN) injection 0-4 mg (has no administration in time range)    Followed by  LORazepam (ATIVAN) injection 0-4 mg (has no administration in time range)  enoxaparin (LOVENOX) injection 40 mg (has no administration in time range)  0.45 % sodium chloride  infusion (has no administration in time range)  acetaminophen (TYLENOL) tablet 650 mg (has no administration in time range)    Or  acetaminophen (TYLENOL) suppository 650 mg (has no administration in time  range)  ibuprofen (ADVIL,MOTRIN) tablet 400 mg (has no administration in time range)  senna-docusate (Senokot-S) tablet 1 tablet (has no administration in time range)  ondansetron (ZOFRAN) tablet 4 mg (has no administration in time range)    Or  ondansetron (ZOFRAN) injection 4 mg (has no administration in time range)  sodium chloride 0.9 % 1,000 mL with thiamine 484 mg, folic acid 1 mg, multivitamins adult 10 mL infusion ( Intravenous Rate/Dose Verify 09/15/18 0154)  ondansetron (ZOFRAN) injection 4 mg (4 mg Intravenous Given 09/14/18 2356)    Mobility walks

## 2018-09-16 MED ORDER — FOLIC ACID 1 MG PO TABS: 1.0000 mg | ORAL_TABLET | Freq: Every day | ORAL | 0 refills | Status: DC

## 2018-09-16 MED ORDER — THIAMINE HCL 100 MG PO TABS: 100.0000 mg | ORAL_TABLET | Freq: Every day | ORAL | 0 refills | Status: DC

## 2018-09-16 NOTE — Progress Notes (Signed)
Discharge instructions and medications discussed with patient.  AVS and prescriptions given to patient.  All questions answered.  

## 2018-09-16 NOTE — Discharge Summary (Signed)
Physician Discharge Summary  ZEDRIC DEROY IRJ:[id number] DOB: [birth year] DOA: 09/14/2018  PCP: Arman Filter, FNP  Admit date: 09/14/2018 Discharge date: 09/16/2018  Admitted From: Home Disposition:  Home  Discharge Condition:Stable CODE STATUS:FULL Diet recommendation:  Regular  Brief/Interim Summary: Patient is a 27 year old male with past medical history of chronic alcohol abuse, alcohol withdrawal seizure who presents the emergency department anxiety, nausea with vomiting and visual hallucination.  Patient was admitted for alcohol intoxication, risk of withdrawal and seizures.  His ethanol level was 231 on presentation.  UDS was also positive for methamphetamine.  Patient was started on alcohol withdrawal protocol. This morning he looks comfortable.  His vitals were stable.  He denies any complaints.  He is currently not on withdrawal.  He is stable for discharge to home today.  Social worker has already met  the patient and given resources for the alcohol rehabitation.  Patient counseled for alcohol cessation. Patient will be prescribed thiamine and folic acid.  He has been advised to follow-up with PCP as an outpatient.   Discharge Diagnoses:  Principal Problem:   Alcohol withdrawal, with perceptual disturbance [facility])    Discharge Instructions  Discharge Instructions    Diet - low sodium heart healthy   Complete by:  As directed    Discharge instructions   Complete by:  As directed    1) Please stop taking alcohol.  Follow-up with alcohol rehabilitation services as an outpatient. 2)Take prescribed medications as instructed. 3) Follow up with your PCP as an outpatient.   Increase activity slowly   Complete by:  As directed      Allergies as of 09/16/2018   No Known Allergies     Medication List    TAKE these medications   amphetamine-dextroamphetamine 10 MG tablet Commonly known as:  ADDERALL Take 10 mg by mouth once.   folic acid 1 MG tablet Commonly  known as:  FOLVITE Take 1 tablet (1 mg total) by mouth daily. Start taking on:  09/17/2018   thiamine 100 MG tablet Take 1 tablet (100 mg total) by mouth daily. Start taking on:  09/17/2018      Follow-up Information    Arman Filter, FNP. Schedule an appointment as soon as possible for a visit in 1 week(s).   Specialty:  Nurse Practitioner Contact information: [address] [place] [address] [phone]          No Known Allergies  Consultations:  None   Procedures/Studies:  No results found.    Subjective: Patient seen and examined the bedside this morning.  Remains comfortable.  No active issues.  Stable for discharge home today.  Discharge Exam: Vitals:   09/16/18 0142 09/16/18 0547  BP: (!) 134/91 (!) 131/94  Pulse: 76 84  Resp: 14 15  Temp: 98.9 F (37.2 C) 98.3 F (36.8 C)  SpO2: 99% 99%   Vitals:   09/15/18 1756 09/15/18 2057 09/16/18 0142 09/16/18 0547  BP: (!) 140/94 136/87 (!) 134/91 (!) 131/94  Pulse: 76 86 76 84  Resp: _0 Temp: 98.1 F (36.7 C) 99.1 F (37.3 C) 98.9 F (37.2 C) 98.3 F (36.8 C)  TempSrc: Oral Oral Oral Oral  SpO2: 97% 98% 99% 99%  Weight:      Height:        General: Pt is alert, awake, not in acute distress Cardiovascular: RRR, S1/S2 +, no rubs, no gallops Respiratory: CTA bilaterally, no wheezing, no rhonchi Abdominal: Soft,  NT, ND, bowel sounds + Extremities: no edema, no cyanosis    The results of significant diagnostics from this hospitalization (including imaging, microbiology, ancillary and laboratory) are listed below for reference.     Microbiology: No results found for this or any previous visit (from the past 240 hour(s)).   Labs: BNP (last 3 results) No results for input(s): BNP in the last 8760 hours. Basic Metabolic Panel: Recent Labs  Lab 09/14/18 2311 09/15/18 0610  NA 146* 145  K 4.2 4.1  CL 106 109  CO2 27 26  GLUCOSE 100* 84  BUN 9 9  CREATININE  0.91 0.89  CALCIUM 9.8 8.9   Liver Function Tests: Recent Labs  Lab 09/14/18 2311 09/15/18 0610  AST 28 24  ALT 25 22  ALKPHOS 55 46  BILITOT 0.9 0.9  PROT 8.8* 7.2  ALBUMIN 5.2* 4.0   No results for input(s): LIPASE, AMYLASE in the last 168 hours. No results for input(s): AMMONIA in the last 168 hours. CBC: Recent Labs  Lab 09/14/18 2311  WBC 6.7  HGB 14.2  HCT 44.2  MCV 87.4  PLT 158   Cardiac Enzymes: No results for input(s): CKTOTAL, CKMB, CKMBINDEX, TROPONINI in the last 168 hours. BNP: Invalid input(s): POCBNP CBG: No results for input(s): GLUCAP in the last 168 hours. D-Dimer No results for input(s): DDIMER in the last 72 hours. Hgb A1c No results for input(s): HGBA1C in the last 72 hours. Lipid Profile No results for input(s): CHOL, HDL, LDLCALC, TRIG, CHOLHDL, LDLDIRECT in the last 72 hours. Thyroid function studies No results for input(s): TSH, T4TOTAL, T3FREE, THYROIDAB in the last 72 hours.  Invalid input(s): FREET3 Anemia work up No results for input(s): VITAMINB12, FOLATE, FERRITIN, TIBC, IRON, RETICCTPCT in the last 72 hours. Urinalysis No results found for: COLORURINE, APPEARANCEUR, LABSPEC, [place], GLUCOSEU, HGBUR, BILIRUBINUR, KETONESUR, PROTEINUR, UROBILINOGEN, NITRITE, LEUKOCYTESUR Sepsis Labs Invalid input(s): PROCALCITONIN,  WBC,  LACTICIDVEN Microbiology No results found for this or any previous visit (from the past 240 hour(s)).  Please note: You were cared for by a hospitalist during your hospital stay. Once you are discharged, your primary care physician will handle any further medical issues. Please note that NO REFILLS for any discharge medications will be authorized once you are discharged, as it is imperative that you return to your primary care physician (or establish a relationship with a primary care physician if you do not have one) for your post hospital discharge needs so that they can reassess your need for medications and  monitor your lab values.    Time coordinating discharge: 40 minutes  SIGNED:   Shelly Coss, MD  [facility] 09/16/2018, 10:24 AM Pager [phone]  If 7PM-7AM, please contact night-coverage [email or web address] Password [email or web address]

## 2019-11-07 ENCOUNTER — Other Ambulatory Visit: Payer: Self-pay

## 2019-11-07 DIAGNOSIS — Z20822 Contact with and (suspected) exposure to covid-19: Secondary | ICD-10-CM

## 2019-11-09 LAB — NOVEL CORONAVIRUS, NAA: SARS-CoV-2, NAA: NOT DETECTED

## 2020-04-17 ENCOUNTER — Other Ambulatory Visit: Payer: Self-pay

## 2020-04-17 ENCOUNTER — Encounter (HOSPITAL_COMMUNITY): Payer: Self-pay | Admitting: Emergency Medicine

## 2020-04-17 ENCOUNTER — Emergency Department (HOSPITAL_COMMUNITY)
Admission: EM | Admit: 2020-04-17 | Discharge: 2020-04-18 | Disposition: A | Discharge status: Other Acute Inpatient Hospital | Payer: BC Managed Care – PPO | Source: Home / Self Care

## 2020-04-17 DIAGNOSIS — Z79899 Other long term (current) drug therapy: Secondary | ICD-10-CM | POA: Insufficient documentation

## 2020-04-17 DIAGNOSIS — Z8616 Personal history of COVID-19: Secondary | ICD-10-CM | POA: Insufficient documentation

## 2020-04-17 DIAGNOSIS — T50902A Poisoning by unspecified drugs, medicaments and biological substances, intentional self-harm, initial encounter: Secondary | ICD-10-CM

## 2020-04-17 DIAGNOSIS — F1320 Sedative, hypnotic or anxiolytic dependence, uncomplicated: Secondary | ICD-10-CM | POA: Insufficient documentation

## 2020-04-17 DIAGNOSIS — F17290 Nicotine dependence, other tobacco product, uncomplicated: Secondary | ICD-10-CM | POA: Insufficient documentation

## 2020-04-17 DIAGNOSIS — F332 Major depressive disorder, recurrent severe without psychotic features: Secondary | ICD-10-CM | POA: Insufficient documentation

## 2020-04-17 DIAGNOSIS — T424X2A Poisoning by benzodiazepines, intentional self-harm, initial encounter: Secondary | ICD-10-CM | POA: Insufficient documentation

## 2020-04-17 DIAGNOSIS — Z20822 Contact with and (suspected) exposure to covid-19: Secondary | ICD-10-CM | POA: Insufficient documentation

## 2020-04-17 DIAGNOSIS — F1910 Other psychoactive substance abuse, uncomplicated: Secondary | ICD-10-CM

## 2020-04-17 HISTORY — DX: COVID-19: U07.1

## 2020-04-17 HISTORY — DX: Suicide attempt, initial encounter: T14.91XA

## 2020-04-17 LAB — COMPREHENSIVE METABOLIC PANEL
ALT: 103 U/L — ABNORMAL HIGH (ref 0–44)
AST: 86 U/L — ABNORMAL HIGH (ref 15–41)
Albumin: 4.7 g/dL (ref 3.5–5.0)
Alkaline Phosphatase: 80 U/L (ref 38–126)
Anion gap: 13 (ref 5–15)
BUN: 11 mg/dL (ref 6–20)
CO2: 26 mmol/L (ref 22–32)
Calcium: 9.6 mg/dL (ref 8.9–10.3)
Chloride: 104 mmol/L (ref 98–111)
Creatinine, Ser: 0.99 mg/dL (ref 0.61–1.24)
GFR calc Af Amer: >60 mL/min (ref >60)
GFR calc non Af Amer: >60 mL/min (ref >60)
Glucose, Bld: 93 mg/dL (ref 70–99)
Potassium: 4 mmol/L (ref 3.5–5.1)
Sodium: 143 mmol/L (ref 135–145)
Total Bilirubin: 1.1 mg/dL (ref 0.3–1.2)
Total Protein: 8.7 g/dL — ABNORMAL HIGH (ref 6.5–8.1)

## 2020-04-17 LAB — ACETAMINOPHEN LEVEL: Acetaminophen (Tylenol), Serum: <10 ug/mL — ABNORMAL LOW (ref 10–30)

## 2020-04-17 LAB — SARS CORONAVIRUS 2 BY RT PCR (HOSPITAL ORDER, PERFORMED IN ~~LOC~~ HOSPITAL LAB): SARS Coronavirus 2: NEGATIVE

## 2020-04-17 LAB — CBC
HCT: 47.8 % (ref 39.0–52.0)
Hemoglobin: 16 g/dL (ref 13.0–17.0)
MCH: 30.2 pg (ref 26.0–34.0)
MCHC: 33.5 g/dL (ref 30.0–36.0)
MCV: 90.4 fL (ref 80.0–100.0)
Platelets: 192 10*3/uL (ref 150–400)
RBC: 5.29 MIL/uL (ref 4.22–5.81)
RDW: 12.5 % (ref 11.5–15.5)
WBC: 6.9 10*3/uL (ref 4.0–10.5)
nRBC: 0 % (ref 0.0–0.2)

## 2020-04-17 LAB — SALICYLATE LEVEL: Salicylate Lvl: <7 mg/dL — ABNORMAL LOW (ref 7.0–30.0)

## 2020-04-17 LAB — ETHANOL: Alcohol, Ethyl (B): 171 mg/dL — ABNORMAL HIGH (ref <10)

## 2020-04-17 MED ORDER — THIAMINE HCL 100 MG/ML IJ SOLN: 100.0000 mg | Freq: Every day | INTRAMUSCULAR | Status: DC

## 2020-04-17 MED ORDER — LORAZEPAM 2 MG/ML IJ SOLN: 0.0000 mg | Freq: Four times a day (QID) | INTRAMUSCULAR | Status: DC

## 2020-04-17 MED ORDER — LORAZEPAM 1 MG PO TABS: 0.0000 mg | ORAL_TABLET | Freq: Two times a day (BID) | ORAL | Status: DC

## 2020-04-17 MED ORDER — ALUM & MAG HYDROXIDE-SIMETH 200-200-20 MG/5ML PO SUSP: 30.0000 mL | Freq: Four times a day (QID) | ORAL | Status: DC | PRN

## 2020-04-17 MED ORDER — LORAZEPAM 2 MG/ML IJ SOLN: 0.0000 mg | Freq: Two times a day (BID) | INTRAMUSCULAR | Status: DC

## 2020-04-17 MED ORDER — LORAZEPAM 1 MG PO TABS
0.0000 mg | ORAL_TABLET | Freq: Four times a day (QID) | ORAL | Status: DC
  Administered 2020-04-17 – 2020-04-18 (×3): 1 mg via ORAL
  Filled 2020-04-17 (×3): qty 1

## 2020-04-17 MED ORDER — IBUPROFEN 200 MG PO TABS: 600.0000 mg | ORAL_TABLET | Freq: Three times a day (TID) | ORAL | Status: DC | PRN

## 2020-04-17 MED ORDER — THIAMINE HCL 100 MG PO TABS
100.0000 mg | ORAL_TABLET | Freq: Every day | ORAL | Status: DC
  Administered 2020-04-18: 100 mg via ORAL
  Filled 2020-04-17: qty 1

## 2020-04-17 MED ORDER — ONDANSETRON HCL 4 MG PO TABS: 4.0000 mg | ORAL_TABLET | Freq: Three times a day (TID) | ORAL | Status: DC | PRN

## 2020-04-17 NOTE — Progress Notes (Addendum)
Received [name] from the main ED, alert and oriented to his new environment. He was given fluids to drink. He stated withdrawal symptoms  from drugs and alcohol with symptomatic anxiety.He was treated with Ativan 1 mg PO and slept throughout the night.

## 2020-04-17 NOTE — ED Triage Notes (Signed)
Patient presents wanting help to detox from drugs and alcohol. He reports having withdrawal from ETOH, cocaine and benzodiazapine. Last night he took xanax hoping not to wake up. Patient reports his last drink being 3 hours ago.

## 2020-04-17 NOTE — ED Provider Notes (Signed)
[facility]-EMERGENCY DEPT Provider Note   CSN: [id number] Arrival date & time: 04/17/20  1919     History Chief Complaint  Patient presents with  . Suicidal  . Alcohol Intoxication    [name] is a 29 y.o. male.  HPI Patient reports he is abusing multiple substances.  He reports he drinks alcohol regularly and will going to withdrawal if he stops drinking.  He reports his last drink was about 5 hours ago.  He reports that he treats withdrawal symptoms with Xanax and treat sedation from Xanax with cocaine or Adderall.  He reports that last night (24 hours ago) he took a bottle of about 14 mg of Xanax hoping that he would not wake up again.  Patient reports he has made a mess of his life with drugs and alcohol.    Past Medical History:  Diagnosis Date  . Alcohol dependence ([facility])   . COVID-19   . Suicidal behavior with attempted self-injury [facility])     Patient Active Problem List   Diagnosis Date Noted  . 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 No pertinent surgical history.     History reviewed. No pertinent family history.  Social History   Tobacco Use  . Smoking status: Current Every Day Smoker    Types: E-cigarettes  . Smokeless tobacco: Never Used  Substance Use Topics  . Alcohol use: Yes    Alcohol/week: 70.0 standard drinks    Types: 70 Standard drinks or equivalent per week  . Drug use: Not on file    Home Medications Prior to Admission medications   Medication Sig Start Date End Date Taking? Authorizing Provider  amphetamine-dextroamphetamine (ADDERALL) 10 MG tablet Take 10 mg by mouth once.     [provider]  folic acid (FOLVITE) 1 MG tablet Take 1 tablet (1 mg total) by mouth daily. 09/17/18   Burnadette Pop, MD  thiamine 100 MG tablet Take 1 tablet (100 mg total) by mouth daily. 09/17/18   Burnadette Pop, MD    Allergies    Patient has no known allergies.  Review  of Systems   Review of Systems 10 Systems reviewed and are negative for acute change except as noted in the HPI. Physical Exam Updated Vital Signs BP (!) 133/96 (BP Location: Left Arm)   Pulse 97   Temp 98.1 F (36.7 C) (Oral)   Resp 18   Ht 6\' 1"  (1.854 m)   Wt 95.3 kg   SpO2 97%   BMI 27.71 kg/m   Physical Exam Constitutional:      Appearance: He is well-developed.  HENT:     Head: Normocephalic and atraumatic.  Cardiovascular:     Rate and Rhythm: Normal rate and regular rhythm.     Heart sounds: Normal heart sounds.  Pulmonary:     Effort: Pulmonary effort is normal.     Breath sounds: Normal breath sounds.  Abdominal:     General: Bowel sounds are normal. There is no distension.     Palpations: Abdomen is soft.     Tenderness: There is no abdominal tenderness.  Musculoskeletal:        General: Normal range of motion.     Cervical back: Neck supple.  Skin:    General: Skin is warm and dry.  Neurological:     Mental Status: He is alert and oriented to person, place, and time.     GCS: GCS eye subscore is 4.  GCS verbal subscore is 5. GCS motor subscore is 6.     Coordination: Coordination normal.     ED Results / Procedures / Treatments   Labs (all labs ordered are listed, but only abnormal results are displayed) Labs Reviewed  COMPREHENSIVE METABOLIC PANEL - Abnormal; Notable for the following components:      Result Value   Total Protein 8.7 (*)    AST 86 (*)    ALT 103 (*)    All other components within normal limits  SARS CORONAVIRUS 2 BY RT PCR (HOSPITAL ORDER, [place] [facility])  CBC  ETHANOL  SALICYLATE LEVEL  ACETAMINOPHEN LEVEL  RAPID URINE DRUG SCREEN, HOSP PERFORMED    EKG None  Radiology No results found.  Procedures Procedures (including critical care time)  Medications Ordered in ED Medications  LORazepam (ATIVAN) injection 0-4 mg (has no administration in time range)    Or  LORazepam (ATIVAN) tablet 0-4 mg  (has no administration in time range)  LORazepam (ATIVAN) injection 0-4 mg (has no administration in time range)    Or  LORazepam (ATIVAN) tablet 0-4 mg (has no administration in time range)  thiamine tablet 100 mg (has no administration in time range)    Or  thiamine (B-1) injection 100 mg (has no administration in time range)  ibuprofen (ADVIL) tablet 600 mg (has no administration in time range)  ondansetron (ZOFRAN) tablet 4 mg (has no administration in time range)  alum & mag hydroxide-simeth (MAALOX/MYLANTA) 200-200-20 MG/5ML suspension 30 mL (has no administration in time range)    ED Course  I have reviewed the triage vital signs and the nursing notes.  Pertinent labs & imaging results that were available during my care of the patient were reviewed by me and considered in my medical decision making (see chart for details).    MDM Rules/Calculators/A&P                      Patient is clinically well in appearance.  He is nontoxic and alert.  No signs of confusion.  No tremor.  No signs of acute withdrawal clinically at this time.  Patient describes history of significant withdrawal.  He did intentionally take overdose of Xanax last night per report.  At this time no signs of somnolence or sedation.  Per patient's report of intentional overdose will place in IVC status for psychiatric evaluation.  Patient is medically cleared at this time for psych evaluation and disposition. Final Clinical Impression(s) / ED Diagnoses Final diagnoses:  Polysubstance abuse ([place])  Intentional overdose of drug in tablet form [facility])    Rx / [place] Orders ED Discharge Orders    None       Charlesetta Shanks, MD 04/17/20 2212

## 2020-04-18 ENCOUNTER — Inpatient Hospital Stay (HOSPITAL_COMMUNITY)
Admission: AD | Admit: 2020-04-18 | Discharge: 2020-04-23 | DRG: 885 | Disposition: A | Discharge status: Home / Self Care | Payer: BC Managed Care – PPO | Source: Intra-hospital | Attending: Psychiatry | Admitting: Psychiatry

## 2020-04-18 ENCOUNTER — Encounter (HOSPITAL_COMMUNITY): Payer: Self-pay | Admitting: Psychiatry

## 2020-04-18 DIAGNOSIS — R000 Tachycardia, unspecified: Secondary | ICD-10-CM | POA: Diagnosis present

## 2020-04-18 DIAGNOSIS — R45851 Suicidal ideations: Secondary | ICD-10-CM | POA: Diagnosis present

## 2020-04-18 DIAGNOSIS — Z8616 Personal history of COVID-19: Secondary | ICD-10-CM

## 2020-04-18 DIAGNOSIS — Y906 Blood alcohol level of 120-199 mg/100 ml: Secondary | ICD-10-CM | POA: Diagnosis present

## 2020-04-18 DIAGNOSIS — Z20822 Contact with and (suspected) exposure to covid-19: Secondary | ICD-10-CM | POA: Diagnosis present

## 2020-04-18 DIAGNOSIS — I10 Essential (primary) hypertension: Secondary | ICD-10-CM | POA: Diagnosis present

## 2020-04-18 DIAGNOSIS — F1024 Alcohol dependence with alcohol-induced mood disorder: Secondary | ICD-10-CM | POA: Diagnosis not present

## 2020-04-18 DIAGNOSIS — G4700 Insomnia, unspecified: Secondary | ICD-10-CM | POA: Diagnosis present

## 2020-04-18 DIAGNOSIS — F1320 Sedative, hypnotic or anxiolytic dependence, uncomplicated: Secondary | ICD-10-CM | POA: Diagnosis present

## 2020-04-18 DIAGNOSIS — F10239 Alcohol dependence with withdrawal, unspecified: Secondary | ICD-10-CM | POA: Diagnosis present

## 2020-04-18 DIAGNOSIS — F17290 Nicotine dependence, other tobacco product, uncomplicated: Secondary | ICD-10-CM | POA: Diagnosis present

## 2020-04-18 DIAGNOSIS — F419 Anxiety disorder, unspecified: Secondary | ICD-10-CM | POA: Diagnosis present

## 2020-04-18 DIAGNOSIS — F332 Major depressive disorder, recurrent severe without psychotic features: Principal | ICD-10-CM | POA: Diagnosis present

## 2020-04-18 DIAGNOSIS — F10229 Alcohol dependence with intoxication, unspecified: Secondary | ICD-10-CM | POA: Diagnosis present

## 2020-04-18 DIAGNOSIS — F1994 Other psychoactive substance use, unspecified with psychoactive substance-induced mood disorder: Secondary | ICD-10-CM | POA: Diagnosis not present

## 2020-04-18 MED ORDER — LORAZEPAM 1 MG PO TABS: 1.0000 mg | ORAL_TABLET | Freq: Every day | ORAL | Status: DC

## 2020-04-18 MED ORDER — THIAMINE HCL 100 MG PO TABS
100.0000 mg | ORAL_TABLET | Freq: Every day | ORAL | Status: DC
  Administered 2020-04-19 – 2020-04-23 (×5): 100 mg via ORAL
  Filled 2020-04-18 (×7): qty 1

## 2020-04-18 MED ORDER — HYDROXYZINE HCL 25 MG PO TABS
25.0000 mg | ORAL_TABLET | Freq: Four times a day (QID) | ORAL | Status: AC | PRN
  Administered 2020-04-19 – 2020-04-20 (×2): 25 mg via ORAL
  Filled 2020-04-18 (×2): qty 1

## 2020-04-18 MED ORDER — MAGNESIUM HYDROXIDE 400 MG/5ML PO SUSP: 30.0000 mL | Freq: Every day | ORAL | Status: DC | PRN

## 2020-04-18 MED ORDER — LORAZEPAM 1 MG PO TABS
1.0000 mg | ORAL_TABLET | Freq: Four times a day (QID) | ORAL | Status: AC | PRN
  Administered 2020-04-19 – 2020-04-21 (×6): 1 mg via ORAL
  Filled 2020-04-18 (×6): qty 1

## 2020-04-18 MED ORDER — LORAZEPAM 1 MG PO TABS
1.0000 mg | ORAL_TABLET | Freq: Four times a day (QID) | ORAL | Status: DC
  Administered 2020-04-18 (×2): 1 mg via ORAL
  Filled 2020-04-18 (×2): qty 1

## 2020-04-18 MED ORDER — ONDANSETRON 4 MG PO TBDP
4.0000 mg | ORAL_TABLET | Freq: Four times a day (QID) | ORAL | Status: DC | PRN
  Administered 2020-04-20 – 2020-04-21 (×3): 4 mg via ORAL
  Filled 2020-04-18 (×3): qty 1

## 2020-04-18 MED ORDER — LORAZEPAM 1 MG PO TABS: 1.0000 mg | ORAL_TABLET | Freq: Three times a day (TID) | ORAL | Status: DC

## 2020-04-18 MED ORDER — LORAZEPAM 1 MG PO TABS: 1.0000 mg | ORAL_TABLET | Freq: Two times a day (BID) | ORAL | Status: DC

## 2020-04-18 MED ORDER — ACETAMINOPHEN 325 MG PO TABS
650.0000 mg | ORAL_TABLET | Freq: Four times a day (QID) | ORAL | Status: DC | PRN
  Administered 2020-04-18 – 2020-04-20 (×2): 650 mg via ORAL
  Filled 2020-04-18 (×2): qty 2

## 2020-04-18 MED ORDER — THIAMINE HCL 100 MG/ML IJ SOLN
100.0000 mg | Freq: Once | INTRAMUSCULAR | Status: AC
  Administered 2020-04-18: 100 mg via INTRAMUSCULAR
  Filled 2020-04-18: qty 2

## 2020-04-18 MED ORDER — ADULT MULTIVITAMIN W/MINERALS CH
1.0000 | ORAL_TABLET | Freq: Every day | ORAL | Status: DC
  Administered 2020-04-19 – 2020-04-23 (×5): 1 via ORAL
  Filled 2020-04-18 (×7): qty 1

## 2020-04-18 MED ORDER — ALUM & MAG HYDROXIDE-SIMETH 200-200-20 MG/5ML PO SUSP: 30.0000 mL | ORAL | Status: DC | PRN

## 2020-04-18 MED ORDER — LOPERAMIDE HCL 2 MG PO CAPS: 2.0000 mg | ORAL_CAPSULE | ORAL | Status: AC | PRN

## 2020-04-18 NOTE — Progress Notes (Addendum)
Admission DAR Note: Pt transferred from [facility] for continuation of care related to MDD and substance abuse and SI with an intentional OD on 04/16/20 with unknown amount of Xenax with the intention "to end my life". Presents very anxious and fidgety on interactions, will not stop moving his legs. Denies SI, HI, AVH and pain when assessed "not right now but I'm very anxious and depressed right now". Reports current stressors include "increased workload, taking care of a toddler is not easy either" and relationship issues with current girlfriend "my girlfriend cheated on me, we live together with the baby". Reports he started drinking at 29 years of age. Longest periods of sobriety was after Legrand Pitts of 2018 "I was sober for 90 days". States he currently drinks "12-15 bottles or drinks a day, I drink beer and liquer". States his parents and girlfriend are supportive. Cooperative with skin assessment, skin is intact without areas of breakdown. Multiple tattoos noted all over pt's body. Belongings searched and items deemed contraband secured in locker. Emotional support offered to pt as needed. Unit orientation done, routines discussed and care plan reviewed with pt, understanding verbalized. Pt's BP was elevated, scheduled medications administered as ordered and provider notified. Per Dr. Jama Flavors; Ativan 1 mg PO maybe repeated if BP is rechecked and still elevated within the hour. Pt ambulatory to unit with steady gait.  Q 15 minutes safety checks initiated without self harm gestures or outburst.

## 2020-04-18 NOTE — BHH Counselor (Signed)
Disposition: Dr. Lucianne Muss recommends in patient treatment. [facility] to review.

## 2020-04-18 NOTE — ED Notes (Signed)
Report given Raford Pitcher at [facility]. Called [employer] for transport.

## 2020-04-18 NOTE — BH Assessment (Addendum)
[facility] Assessment Progress Note  Per Nelly Rout, MD, this pt requires psychiatric hospitalization.  Jasmine has assigned pt to [facility] Rm 307-2; [facility] will be ready to receive pt at 15:00.  Pt presents under IVC initiated by EDP Arby Barrette, MD, and IVC documents have been faxed to [facility].  Pt's nurse, Diane, has been notified, and agrees to call report to [phone].  Pt is to be transported via Patent examiner.   Doylene Canning, [place] [facility] Coordinator [phone]

## 2020-04-18 NOTE — Progress Notes (Signed)
   04/18/20 2342  Psych Admission Type (Psych Patients Only)  Admission Status Voluntary  Psychosocial Assessment  Patient Complaints Anxiety;Substance abuse  Eye Contact Fair  Facial Expression Anxious;Worried  Affect Appropriate to circumstance  Furniture conservator/restorer;Restless  Appearance/Hygiene Unremarkable;In scrubs  Behavior Characteristics Cooperative;Anxious  Mood Anxious  Thought Process  Coherency WDL  Content Blaming self  Delusions None reported or observed  Perception WDL  Hallucination None reported or observed  Judgment WDL  Confusion None  Danger to Self  Current suicidal ideation? Denies  Danger to Others  Danger to Others None reported or observed  D: Patient reports increase anxiety due to withdrawal.    A: Medications administered as prescribed. Support and encouragement provided as needed.  R: Patient remains safe on the unit. Will continue to monitor for safety and stability.

## 2020-04-18 NOTE — ED Notes (Signed)
Transported to [facility] by [employer].  Belongings given to [facility] officer for pt. Pt was cooperative and calm.
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
## 2020-04-18 NOTE — Progress Notes (Signed)
Patient did attend the evening speaker AA meeting.  

## 2020-04-18 NOTE — Tx Team (Signed)
Initial Treatment Plan 04/18/2020 8:06 PM [name] SMO:[id number]    PATIENT STRESSORS: Occupational concerns Substance abuse Other: "My girlfriend cheated on me"   PATIENT STRENGTHS: Ability for insight Capable of independent living Psychologist, counselling means Physical Health Supportive family/friends Work skills   PATIENT IDENTIFIED PROBLEMS: Alterations in mood (Anxiety & Depression) "I'm very anxious and I can't sit still, I've been depressed for a while with family stuff, work stress".    Substance abuse "I have been drinking since I was 29 years old and I have been taking 4-7 mg of Klonopin or Xenax every day".                 DISCHARGE CRITERIA:  Improved stabilization in mood, thinking, and/or behavior Verbal commitment to aftercare and medication compliance Withdrawal symptoms are absent or subacute and managed without 24-hour nursing intervention  PRELIMINARY DISCHARGE PLAN: Outpatient therapy Return to previous living arrangement Return to previous work or school arrangements  PATIENT/FAMILY INVOLVEMENT: This treatment plan has been presented to and reviewed with the patient, Wm T. Otten.  The patient have been given the opportunity to ask questions and make suggestions.  Sherryl Manges, RN 04/18/2020, 8:06 PM

## 2020-04-19 DIAGNOSIS — F1320 Sedative, hypnotic or anxiolytic dependence, uncomplicated: Secondary | ICD-10-CM

## 2020-04-19 DIAGNOSIS — F1024 Alcohol dependence with alcohol-induced mood disorder: Secondary | ICD-10-CM

## 2020-04-19 DIAGNOSIS — F1994 Other psychoactive substance use, unspecified with psychoactive substance-induced mood disorder: Secondary | ICD-10-CM

## 2020-04-19 MED ORDER — TRAZODONE HCL 50 MG PO TABS
50.0000 mg | ORAL_TABLET | Freq: Every evening | ORAL | Status: DC | PRN
  Administered 2020-04-19: 50 mg via ORAL
  Filled 2020-04-19: qty 1

## 2020-04-19 MED ORDER — VENLAFAXINE HCL ER 150 MG PO CP24
150.0000 mg | ORAL_CAPSULE | Freq: Every day | ORAL | Status: DC
  Administered 2020-04-20 – 2020-04-23 (×4): 150 mg via ORAL
  Filled 2020-04-19 (×6): qty 1

## 2020-04-19 MED ORDER — GABAPENTIN 100 MG PO CAPS
200.0000 mg | ORAL_CAPSULE | Freq: Three times a day (TID) | ORAL | Status: DC
  Administered 2020-04-19 – 2020-04-21 (×8): 200 mg via ORAL
  Filled 2020-04-19 (×14): qty 2
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
## 2020-04-19 NOTE — Progress Notes (Signed)
   04/19/20 2232  COVID-19 Daily Checkoff  Have you had a fever (temp > 37.80C/100F)  in the past 24 hours?  No  If you have had runny nose, nasal congestion, sneezing in the past 24 hours, has it worsened? No  COVID-19 EXPOSURE  Have you traveled outside the state in the past 14 days? No  Have you been in contact with someone with a confirmed diagnosis of COVID-19 or PUI in the past 14 days without wearing appropriate PPE? No  Have you been living in the same home as a person with confirmed diagnosis of COVID-19 or a PUI (household contact)? No  Have you been diagnosed with COVID-19? No

## 2020-04-19 NOTE — Progress Notes (Signed)
   04/19/20 1000  Psych Admission Type (Psych Patients Only)  Admission Status Voluntary  Psychosocial Assessment  Patient Complaints Anxiety  Eye Contact Fair  Facial Expression Anxious;Worried  Affect Appropriate to circumstance  Furniture conservator/restorer;Restless  Appearance/Hygiene Unremarkable;In scrubs  Behavior Characteristics Cooperative;Appropriate to situation  Mood Anxious  Thought Process  Coherency WDL  Content WDL  Delusions None reported or observed  Perception WDL  Hallucination None reported or observed  Judgment WDL  Confusion None  Danger to Self  Current suicidal ideation? Denies  Danger to Others  Danger to Others None reported or observed

## 2020-04-19 NOTE — Progress Notes (Signed)
   04/19/20 2235  Psych Admission Type (Psych Patients Only)  Admission Status Voluntary  Psychosocial Assessment  Patient Complaints Substance abuse  Eye Contact Fair  Facial Expression Anxious  Affect Appropriate to circumstance  Speech Logical/coherent  Interaction Assertive  Motor Activity Restless  Appearance/Hygiene Unremarkable  Behavior Characteristics Appropriate to situation  Mood Anxious;Pleasant  Thought Process  Coherency WDL  Content WDL  Delusions None reported or observed  Perception WDL  Hallucination None reported or observed  Judgment WDL  Confusion None  Danger to Self  Current suicidal ideation? Denies  Danger to Others  Danger to Others None reported or observed

## 2020-04-19 NOTE — [facility] Group Notes (Signed)
Adult Psychoeducational Group Note  Date:  04/19/2020 Time:  10:42 PM  Group Topic/Focus:  Wrap-Up Group:   The focus of this group is to help patients review their daily goal of treatment and discuss progress on daily workbooks.  Participation Level:  Active  Participation Quality:  Appropriate  Affect:  Appropriate  Cognitive:  Appropriate  Insight: Appropriate  Engagement in Group:  Engaged  Modes of Intervention:  Discussion  Additional Comments:  Patient attended and participated in the wrap up group in which he shared that his goal for the day was to wash his hair and added that he felt productive, relieved and clean when he achieved his goal.  Patient rated his day a 6 because he feeling a bit better.  Jearl Klinefelter 04/19/2020, 10:42 PM

## 2020-04-19 NOTE — H&P (Signed)
Psychiatric Admission Assessment Adult  Patient Identification: [name] MRN:  [id number] Date of Evaluation:  04/19/2020 Chief Complaint:  MDD (major depressive disorder), recurrent episode, severe (HCC) [F33.2] Principal Diagnosis: <principal problem not specified> Diagnosis:  Active Problems:   MDD (major depressive disorder), recurrent episode, severe (HCC)  History of Present Illness: Patient is seen and examined.  Patient is a 29 year old male with a past psychiatric history significant for polysubstance dependence including alcohol and benzodiazepines who presented to the [facility] emergency department on 04/17/2020 with request for detox and possible suicidal ideation.  The patient reported that on 5/12 while intoxicated he took an unknown amount of Xanax with the intention of ending his life.  He stated he felt as though he was going into withdrawal and had anxiety.  He identified several life stressors including his girlfriend cheating on him, being short staffed at work, and raising a 64-year-old child.  The patient has a longstanding history of alcohol dependence as well as benzodiazepine dependence.  The patient stated he drinks 4-5 drinks prior to work to make sure that he can get things done.  He stated he drank 12-15 drinks daily.  He stated he took somewhere between 4 to 7 mg of clonazepam or alprazolam on a daily basis.  He apparently was obtaining these illicitly.  He stated that he has had basically 1 to 2 days of sobriety over the last 6 weeks.  He stated that after his last psychiatric hospitalization in 2018 for similar circumstances he remained sober for approximately 2 months.  He stated he sees a Publishing rights manager for med management is prescribed Effexor extended release 150 mg p.o. daily.  His blood alcohol in the emergency department was 171.  We do not have a drug screen so far.  Review of the PMP database revealed no prescriptions for controlled  substances.  He denies suicidal ideation this morning.  He was admitted to the hospital for evaluation and stabilization.  Associated Signs/Symptoms: Depression Symptoms:  depressed mood, anhedonia, insomnia, fatigue, feelings of worthlessness/guilt, difficulty concentrating, hopelessness, suicidal thoughts without plan, suicidal attempt, loss of energy/fatigue, disturbed sleep, (Hypo) Manic Symptoms:  Impulsivity, Irritable Mood, Anxiety Symptoms:  Excessive Worry, Psychotic Symptoms:  Denied PTSD Symptoms: Had a traumatic exposure:  In the past Total Time spent with patient: 45 minutes  Past Psychiatric History: Patient has a longstanding past psychiatric history.  At least in our records his first psychiatric admission to our facility was in 2012.  He has been in rehab and detox previously.  He apparently is prescribed Xanax by his report, but the PMP database does not reveal any prescriptions for controlled substances.  Is the patient at risk to self? Yes.    Has the patient been a risk to self in the past 6 months? Yes.    Has the patient been a risk to self within the distant past? Yes.    Is the patient a risk to others? No.  Has the patient been a risk to others in the past 6 months? No.  Has the patient been a risk to others within the distant past? No.   Prior Inpatient Therapy:   Prior Outpatient Therapy:    Alcohol Screening: 1. How often do you have a drink containing alcohol?: 4 or more times a week(12-15 bottles or glass of beer or liquer daily.) 2. How many drinks containing alcohol do you have on a typical day when you are drinking?: 10 or more 3. How  often do you have six or more drinks on one occasion?: Daily or almost daily AUDIT-C Score: 12 4. How often during the last year have you found that you were not able to stop drinking once you had started?: Daily or almost daily 5. How often during the last year have you failed to do what was normally expected  from you because of drinking?: Weekly 6. How often during the last year have you needed a first drink in the morning to get yourself going after a heavy drinking session?: Less than monthly 7. How often during the last year have you had a feeling of guilt of remorse after drinking?: Daily or almost daily 8. How often during the last year have you been unable to remember what happened the night before because you had been drinking?: Less than monthly 9. Have you or someone else been injured as a result of your drinking?: Yes, during the last year 10. Has a relative or friend or a doctor or another health worker been concerned about your drinking or suggested you cut down?: Yes, during the last year Alcohol Use Disorder Identification Test Final Score (AUDIT): 33 Substance Abuse History in the last 12 months:  Yes.   Consequences of Substance Abuse: Medical Consequences:  Clearly led to this admission. Previous Psychotropic Medications: Yes  Psychological Evaluations: Yes  Past Medical History:  Past Medical History:  Diagnosis Date  . Alcohol dependence (HCC)   . COVID-19   . Suicidal behavior with attempted self-injury [facility](HCC)    History reviewed. No pertinent surgical history. Family History: History reviewed. No pertinent family history. Family Psychiatric  History: Noncontributory Tobacco Screening: Have you used any form of tobacco in the last 30 days? (Cigarettes, Smokeless Tobacco, Cigars, and/or Pipes): No("I don't smoke") Social History:  Social History   Substance and Sexual Activity  Alcohol Use Yes  . Alcohol/week: 70.0 standard drinks  . Types: 70 Standard drinks or equivalent per week     Social History   Substance and Sexual Activity  Drug Use Yes  . Types: Benzodiazepines    Additional Social History:                           Allergies:  No Known Allergies Lab Results:  Results for orders placed or performed during the hospital encounter of 04/17/20  (from the past 48 hour(s))  Comprehensive metabolic panel     Status: Abnormal   Collection Time: 04/17/20  7:46 PM  Result Value Ref Range   Sodium 143 135 - 145 mmol/L   Potassium 4.0 3.5 - 5.1 mmol/L   Chloride 104 98 - 111 mmol/L   CO2 26 22 - 32 mmol/L   Glucose, Bld 93 70 - 99 mg/dL    Comment: Glucose reference range applies only to samples taken after fasting for at least 8 hours.   BUN 11 6 - 20 mg/dL   Creatinine, Ser 2.130.99 0.61 - 1.24 mg/dL   Calcium 9.6 8.9 - 08.610.3 mg/dL   Total Protein 8.7 (H) 6.5 - 8.1 g/dL   Albumin 4.7 3.5 - 5.0 g/dL   AST 86 (H) 15 - 41 U/L   ALT 103 (H) 0 - 44 U/L   Alkaline Phosphatase 80 38 - 126 U/L   Total Bilirubin 1.1 0.3 - 1.2 mg/dL   GFR calc non Af Amer >60 >60 mL/min   GFR calc Af Amer >60 >60 mL/min   Anion gap 13  5 - 15    Comment: Performed at [facility], [address], [place], [place] [address]  Ethanol     Status: Abnormal   Collection Time: 04/17/20  7:46 PM  Result Value Ref Range   Alcohol, Ethyl (B) 171 (H) <10 mg/dL    Comment: (NOTE) Lowest detectable limit for serum alcohol is 10 mg/dL. For medical purposes only. Performed at [facility], [address], [place], [place] [address]   Salicylate level     Status: Abnormal   Collection Time: 04/17/20  7:46 PM  Result Value Ref Range   Salicylate Lvl <7.0 (L) 7.0 - 30.0 mg/dL    Comment: Performed at [facility], [address], [place], [place] [address]  Acetaminophen level     Status: Abnormal   Collection Time: 04/17/20  7:46 PM  Result Value Ref Range   Acetaminophen (Tylenol), Serum <10 (L) 10 - 30 ug/mL    Comment: (NOTE) Therapeutic concentrations vary significantly. A range of 10-30 ug/mL  may be an effective concentration for many patients. However, some  are best treated at concentrations outside of this range. Acetaminophen concentrations >150 ug/mL at 4 hours after ingestion  and >50 ug/mL at 12  hours after ingestion are often associated with  toxic reactions. Performed at [facility], [address] [place], [place] [address]   cbc     Status: None   Collection Time: 04/17/20  7:46 PM  Result Value Ref Range   WBC 6.9 4.0 - 10.5 K/uL   RBC 5.29 4.22 - 5.81 MIL/uL   Hemoglobin 16.0 13.0 - 17.0 g/dL   HCT 58.0 99.8 - 33.8 %   MCV 90.4 80.0 - 100.0 fL   MCH 30.2 26.0 - 34.0 pg   MCHC 33.5 30.0 - 36.0 g/dL   RDW 25.0 53.9 - 76.7 %   Platelets 192 150 - 400 K/uL   nRBC 0.0 0.0 - 0.2 %    Comment: Performed at [facility], [address], [place], [place] [address]  SARS Coronavirus 2 by RT PCR (hospital order, performed in [facility] hospital lab) Nasopharyngeal Nasopharyngeal Swab     Status: None   Collection Time: 04/17/20 10:03 PM   Specimen: Nasopharyngeal Swab  Result Value Ref Range   SARS Coronavirus 2 NEGATIVE NEGATIVE    Comment: (NOTE) SARS-CoV-2 target nucleic acids are NOT DETECTED. The SARS-CoV-2 RNA is generally detectable in upper and lower respiratory specimens during the acute phase of infection. The lowest concentration of SARS-CoV-2 viral copies this assay can detect is 250 copies / mL. A negative result does not preclude SARS-CoV-2 infection and should not be used as the sole basis for treatment or other patient management decisions.  A negative result may occur with improper specimen collection / handling, submission of specimen other than nasopharyngeal swab, presence of viral mutation(s) within the areas targeted by this assay, and inadequate number of viral copies (<250 copies / mL). A negative result must be combined with clinical observations, patient history, and epidemiological information. Fact Sheet for Patients:   BoilerBrush.com.cy Fact Sheet for Healthcare Providers: https://pope.com/ This test is not yet approved or cleared  by the [place] FDA  and has been authorized for detection and/or diagnosis of SARS-CoV-2 by FDA under an Emergency Use Authorization (EUA).  This EUA will remain in effect (meaning this test can be used) for the duration of the COVID-19 declaration under Section 564(b)(1) of the  Act, 21 U.S.C. section 360bbb-3(b)(1), unless the authorization is terminated or revoked sooner. Performed at [facility], [place] [address], [place], [place] [address]     Blood Alcohol level:  Lab Results  Component Value Date   ETH 171 (H) 04/17/2020   ETH 231 (H) 94/76/5465    Metabolic Disorder Labs:  No results found for: HGBA1C, MPG No results found for: PROLACTIN No results found for: CHOL, TRIG, HDL, CHOLHDL, VLDL, LDLCALC  Current Medications: Current Facility-Administered Medications  Medication Dose Route Frequency Provider Last Rate Last Admin  . acetaminophen (TYLENOL) tablet 650 mg  650 mg Oral Q6H PRN Emmaline Kluver, FNP   650 mg at 04/18/20 2136  . alum & mag hydroxide-simeth (MAALOX/MYLANTA) 200-200-20 MG/5ML suspension 30 mL  30 mL Oral Q4H PRN Emmaline Kluver, FNP      . gabapentin (NEURONTIN) capsule 200 mg  200 mg Oral TID Sharma Covert, MD   200 mg at 04/19/20 1145  . hydrOXYzine (ATARAX/VISTARIL) tablet 25 mg  25 mg Oral Q6H PRN Emmaline Kluver, FNP      . loperamide (IMODIUM) capsule 2-4 mg  2-4 mg Oral PRN Emmaline Kluver, FNP      . LORazepam (ATIVAN) tablet 1 mg  1 mg Oral Q6H PRN Emmaline Kluver, FNP   1 mg at 04/19/20 1145  . magnesium hydroxide (MILK OF MAGNESIA) suspension 30 mL  30 mL Oral Daily PRN Emmaline Kluver, FNP      . multivitamin with minerals tablet 1 tablet  1 tablet Oral Daily Emmaline Kluver, FNP      . ondansetron (ZOFRAN-ODT) disintegrating tablet 4 mg  4 mg Oral Q6H PRN Emmaline Kluver, FNP      . thiamine tablet 100 mg  100 mg Oral Daily Emmaline Kluver, FNP      . [START ON 04/20/2020] venlafaxine XR (EFFEXOR-XR) 24 hr capsule 150 mg  150 mg Oral Q breakfast Mallie Darting Cordie Grice,  MD       PTA Medications: Medications Prior to Admission  Medication Sig Dispense Refill Last Dose  . folic acid (FOLVITE) 1 MG tablet Take 1 tablet (1 mg total) by mouth daily. (Patient not taking: Reported on 04/17/2020) 30 tablet 0   . thiamine 100 MG tablet Take 1 tablet (100 mg total) by mouth daily. (Patient not taking: Reported on 04/17/2020) 30 tablet 0   . venlafaxine XR (EFFEXOR-XR) 150 MG 24 hr capsule Take 150 mg by mouth every morning.       Musculoskeletal: Strength & Muscle Tone: within normal limits Gait & Station: normal Patient leans: N/A  Psychiatric Specialty Exam: Physical Exam  Nursing note and vitals reviewed. Constitutional: He is oriented to person, place, and time. He appears well-developed and well-nourished.  HENT:  Head: Normocephalic and atraumatic.  Respiratory: Effort normal.  Neurological: He is alert and oriented to person, place, and time.    Review of Systems  Blood pressure (!) 146/109, pulse (!) 112, temperature (!) 97.5 F (36.4 C), temperature source Oral, resp. rate 20, height 6\' 1"  (1.854 m), weight 95.2 kg, SpO2 100 %.Body mass index is 27.7 kg/m.  General Appearance: Disheveled  Eye Contact:  Fair  Speech:  Normal Rate  Volume:  Normal  Mood:  Anxious  Affect:  Congruent  Thought Process:  Coherent and Descriptions of Associations: Intact  Orientation:  Full (Time, Place, and Person)  Thought Content:  Logical  Suicidal Thoughts:  No  Homicidal  Thoughts:  No  Memory:  Immediate;   Fair Recent;   Fair Remote;   Fair  Judgement:  Intact  Insight:  Fair  Psychomotor Activity:  Increased  Concentration:  Concentration: Fair and Attention Span: Fair  Recall:  [employer] of Knowledge:  Fair  Language:  Good  Akathisia:  Negative  Handed:  Right  AIMS (if indicated):     Assets:  Desire for Improvement Resilience  ADL's:  Intact  Cognition:  WNL  Sleep:  Number of Hours: 6.25    Treatment Plan Summary: Daily contact with  patient to assess and evaluate symptoms and progress in treatment, Medication management and Plan : Patient is seen and examined.  Patient is a 29 year old male with the above-stated past psychiatric history who is admitted for detox and evaluation and stabilization.  He will be admitted to the hospital.  He will be integrated into the milieu.  He will be encouraged to attend groups.  He will be placed on lorazepam 1 mg p.o. every 6 hours as needed a CIWA greater than 10.  He will also be placed on Neurontin 200 mg p.o. 3 times daily.  We will continue the Effexor extended release at 150 mg p.o. daily.  He will also have available hydroxyzine as needed for anxiety and trazodone for sleep.  Review of the laboratories that we have available show elevated liver function enzymes with an AST of 86 and an ALT of 103.  Otherwise his electrolytes were normal.  His CBC was normal.  His blood alcohol was 171 on admission.  Acetaminophen was less than 10, salicylate was less than 7.  We will place him on seizure precautions.  Observation Level/Precautions:  Detox 15 minute checks Seizure  Laboratory:  Chemistry Profile  Psychotherapy:    Medications:    Consultations:    Discharge Concerns:    Estimated LOS:  Other:     Physician Treatment Plan for Primary Diagnosis: <principal problem not specified> Long Term Goal(s): Improvement in symptoms so as ready for discharge  Short Term Goals: Ability to identify changes in lifestyle to reduce recurrence of condition will improve, Ability to verbalize feelings will improve, Ability to disclose and discuss suicidal ideas, Ability to demonstrate self-control will improve, Ability to identify and develop effective coping behaviors will improve, Ability to maintain clinical measurements within normal limits will improve and Ability to identify triggers associated with substance abuse/mental health issues will improve  Physician Treatment Plan for Secondary Diagnosis:  Active Problems:   MDD (major depressive disorder), recurrent episode, severe (HCC)  Long Term Goal(s): Improvement in symptoms so as ready for discharge  Short Term Goals: Ability to identify changes in lifestyle to reduce recurrence of condition will improve, Ability to verbalize feelings will improve, Ability to disclose and discuss suicidal ideas, Ability to demonstrate self-control will improve, Ability to identify and develop effective coping behaviors will improve, Ability to maintain clinical measurements within normal limits will improve and Ability to identify triggers associated with substance abuse/mental health issues will improve  I certify that inpatient services furnished can reasonably be expected to improve the patient's condition.    Antonieta Pert, MD 5/15/20212:26 PM

## 2020-04-20 DIAGNOSIS — F332 Major depressive disorder, recurrent severe without psychotic features: Principal | ICD-10-CM

## 2020-04-20 LAB — RAPID URINE DRUG SCREEN, HOSP PERFORMED
Amphetamines: NOT DETECTED
Barbiturates: NOT DETECTED
Benzodiazepines: POSITIVE — AB
Cocaine: POSITIVE — AB
Opiates: NOT DETECTED
Tetrahydrocannabinol: NOT DETECTED

## 2020-04-20 MED ORDER — TRAZODONE HCL 50 MG PO TABS
50.0000 mg | ORAL_TABLET | Freq: Every evening | ORAL | Status: DC | PRN
  Filled 2020-04-20: qty 1

## 2020-04-20 MED ORDER — TRAZODONE HCL 100 MG PO TABS: 100.0000 mg | ORAL_TABLET | Freq: Every evening | ORAL | Status: DC | PRN

## 2020-04-20 MED ORDER — MIRTAZAPINE 15 MG PO TABS
15.0000 mg | ORAL_TABLET | Freq: Every day | ORAL | Status: DC
  Administered 2020-04-20 – 2020-04-22 (×3): 15 mg via ORAL
  Filled 2020-04-20 (×6): qty 1

## 2020-04-20 NOTE — [facility] Counselor (Signed)
Adult Comprehensive Assessment  Patient ID: [name], male   DOB: 03-08-1991, 29 y.o.   MRN: [id number]  Information Source: Information source: Patient  Current Stressors:  Patient states their primary concerns and needs for treatment are:: Withdrawals from alcohol, doing cocaine heavily, mood treatment Patient states their goals for this hospitilization and ongoing recovery are:: Wants to get healthy and sober, get some insight on why his mood switches the way it does, get information on an outpatient program to go to after this Educational / Learning stressors: Denies stressors Employment / Job issues: Responsible for a lot, a fairly new job and schedule (now working nights and with people he does not know) Family Relationships: Denies stressors Museum/gallery curator / Lack of resources (include bankruptcy): Denies stressors Housing / Lack of housing: Denies stressors Physical health (include injuries & life threatening diseases): Has always been obsessive about working out and dieting Social relationships: Stress with girlfriend, not in a good place when he came here. Substance abuse: Alcohol long-term, cocaine recently more habitual, benzos occasionally to sleep, mushrooms occasionally Bereavement / Loss: Denies stressors  Living/Environment/Situation:  Living Arrangements: Spouse/significant other, Children Living conditions (as described by patient or guardian): Good, nice house Who else lives in the home?: Girlfriend, daughter Monday-Friday How long has patient lived in current situation?: 4 years What is atmosphere in current home: Comfortable, Supportive  Family History:  Marital status: Long term relationship Long term relationship, how long?: 5 years What types of issues is patient dealing with in the relationship?: Arguing because of financial issues.  See each other in passing because when one goes to work, the other is taking of daughter. Are you sexually active?: Yes What is  your sexual orientation?: Straight Does patient have children?: Yes How many children?: 1 How is patient's relationship with their children?: 79 years old daughter - close  Childhood History:  By whom was/is the patient raised?: Both parents Description of patient's relationship with caregiver when they were a child: "I was spoiled."  Parents were separated but lived in the same house. Patient's description of current relationship with people who raised him/her: Mother - great relationship; Father - still good How were you disciplined when you got in trouble as a child/adolescent?: Sent to room Does patient have siblings?: Yes Number of Siblings: 1 Description of patient's current relationship with siblings: Sister - still lives at home with parents, "alright" relationship, has auditory processing disorder, social anxiety, ADD. Did patient suffer any verbal/emotional/physical/sexual abuse as a child?: No Did patient suffer from severe childhood neglect?: No Has patient ever been sexually abused/assaulted/raped as an adolescent or adult?: No Was the patient ever a victim of a crime or a disaster?: No Witnessed domestic violence?: No Has patient been effected by domestic violence as an adult?: No  Education:  Highest grade of school patient has completed: GED Currently a Ship broker?: No Learning disability?: No  Employment/Work Situation:   Employment situation: Employed Where is patient currently employed?: Education officer, museum at [employer] How long has patient been employed?: 6-7 months Patient's job has been impacted by current illness: Yes Describe how patient's job has been impacted: Met with Environmental consultant, was given emergency 30-day leave of absence What is the longest time patient has a held a job?: 5 years two different times Where was the patient employed at that time?: [employer] Did You Receive Any Psychiatric Treatment/Services While in the [employer]?: (No [employer]) Are  There Guns or Other Weapons in [place]?: No  Financial Resources:  Financial resources: Income from employment, Private insurance Does patient have a representative payee or guardian?: No  Alcohol/Substance Abuse:   What has been your use of drugs/alcohol within the last 12 months?: Daily alcohol use except Tuesdays; Cocaine (Powder) 4 times a week; Benzodiazepines 3-4 times a week; Some mushrooms If attempted suicide, did drugs/alcohol play a role in this?: Yes(Was "really drunk when it happened.") Alcohol/Substance Abuse Treatment Hx: Past Tx, Inpatient, Past detox If yes, describe treatment: 2012 at [facility], ER one time Has alcohol/substance abuse ever caused legal problems?: Yes  Social Support System:   Patient's Community Support System: Good Describe Community Support System: Mother, father, girlfriend, grandma, aunt, co-workers, boss, daughter, girlfriend's family Type of faith/religion: None How does patient's faith help to cope with current illness?: N/A  Leisure/Recreation:   Leisure and Hobbies: Dietitian, teach daughter piano, work out  Strengths/Needs:   What is the patient's perception of their strengths?: Scientist, research (physical sciences), 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 Patient states these barriers may affect/interfere with their treatment: None Patient states these barriers may affect their return to the community: None Other important information patient would like considered in planning for their treatment: None  Discharge Plan:   Currently receiving community mental health services: Yes (From Whom)(Does Telehealth with [facility] for medication management Faythe Casa, NP)) Patient states concerns and preferences for aftercare planning are: Interested in CD-IOP,  returning to current NP who manages his medication, and having an outpatient therapist Patient states they will know when they are safe and ready for discharge when: When withdrawal symptoms are gone, when has "a decent grasp mentally." Does patient have access to transportation?: Yes Does patient have financial barriers related to discharge medications?: No Will patient be returning to same living situation after discharge?: Yes  Summary/Recommendations:   Summary and Recommendations (to be completed by the evaluator): Patient is a 29yo male admitted with polysubstance abuse and a suicide attempt by intentional overdose on an unknown amount of Xanax while intoxicated on alcohol.  Primary stressors are conflict in the relationship with his girlfriend, substance abuse issues, working in a new job with people he does not know, and raising 2yo daughter while alternating care with her mother.  He has used alcohol almost daily for a long time, powder cocaine and benzodiazepines 3-4 times a week for a long time, and intermittently has used mushrooms.  He has a Telehealth psychiatric NP with [facility], but is interested in [facility] CD-IOP program.  His job has given him a 30-day emergency leave of absence.  He will need a letter at discharge for work.  Patient will benefit from crisis stabilization, medication evaluation, group therapy and psychoeducation, in addition to case management for discharge planning. At discharge it is recommended that Patient adhere to the established discharge plan and continue in treatment.  Maretta Los. 04/20/2020

## 2020-04-20 NOTE — Progress Notes (Signed)
   04/20/20 2232  COVID-19 Daily Checkoff  Have you had a fever (temp > 37.80C/100F)  in the past 24 hours?  No  If you have had runny nose, nasal congestion, sneezing in the past 24 hours, has it worsened? No  COVID-19 EXPOSURE  Have you traveled outside the state in the past 14 days? No  Have you been in contact with someone with a confirmed diagnosis of COVID-19 or PUI in the past 14 days without wearing appropriate PPE? No  Have you been living in the same home as a person with confirmed diagnosis of COVID-19 or a PUI (household contact)? No  Have you been diagnosed with COVID-19? No

## 2020-04-20 NOTE — Progress Notes (Signed)
[facility] MD Progress Note  04/20/2020 3:44 PM [name]  MRN:  [id number]   Subjective: Follow-up for this 29 year old male diagnosed with MDD severe recurrent as well as alcohol abuse.  Patient reports today that he is feeling some better and that he feels that the withdrawal symptoms are improving.  He states that the only thing he has been dealing with so far is some nausea and feeling tired.  He states that he took some Zofran today was able to eat some today.  He denies any suicidal or homicidal ideations and denies any hallucinations.  He reports he is still having some difficulty with sleep at night.  He also reports that he has spoke with social work today and he is discussing going to [facility] after he is discharged from the hospital.  He reports that he is glad to be off of alcohol after he has been drinking for the last 10 years on and off and reports that he does not have any cravings.  He actually reports that he saw a commercial on TV earlier that showed a beer in it and he said he kind of felt sick to his stomach after seeing it.  Principal Problem: MDD (major depressive disorder), recurrent episode, severe ([place]) Diagnosis: Principal Problem:   MDD (major depressive disorder), recurrent episode, severe ([place])  Total Time spent with patient: 30 minutes  Past Psychiatric History: Patient has a longstanding past psychiatric history.  At least in our records his first psychiatric admission to our facility was in 2012.  He has been in rehab and detox previously.  He apparently is prescribed Xanax by his report, but the PMP database does not reveal any prescriptions for controlled substances  Past Medical History:  Past Medical History:  Diagnosis Date  . Alcohol dependence ([place])   . COVID-19   . Suicidal behavior with attempted self-injury [facility])    History reviewed. No pertinent surgical history. Family History: History reviewed. No pertinent family history. Family Psychiatric  History:  None reported Social History:  Social History   Substance and Sexual Activity  Alcohol Use Yes  . Alcohol/week: 70.0 standard drinks  . Types: 70 Standard drinks or equivalent per week     Social History   Substance and Sexual Activity  Drug Use Yes  . Types: Benzodiazepines    Social History   Socioeconomic History  . Marital status: Single    Spouse name: Not on file  . Number of children: Not on file  . Years of education: Not on file  . Highest education level: Not on file  Occupational History  . Not on file  Tobacco Use  . Smoking status: Current Every Day Smoker    Types: E-cigarettes  . Smokeless tobacco: Never Used  Substance and Sexual Activity  . Alcohol use: Yes    Alcohol/week: 70.0 standard drinks    Types: 70 Standard drinks or equivalent per week  . Drug use: Yes    Types: Benzodiazepines  . Sexual activity: Not on file  Other Topics Concern  . Not on file  Social History Narrative  . Not on file   Social Determinants of Health   Financial Resource Strain:   . Difficulty of Paying Living Expenses:   Food Insecurity:   . Worried About Charity fundraiser in the Last Year:   . Arboriculturist in the Last Year:   Transportation Needs:   . Film/video editor (Medical):   Marland Kitchen Lack  of Transportation (Non-Medical):   Physical Activity:   . Days of Exercise per Week:   . Minutes of Exercise per Session:   Stress:   . Feeling of Stress :   Social Connections:   . Frequency of Communication with Friends and Family:   . Frequency of Social Gatherings with Friends and Family:   . Attends Religious Services:   . Active Member of Clubs or Organizations:   . Attends Banker Meetings:   Marland Kitchen Marital Status:    Additional Social History:                         Sleep: Fair  Appetite:  Fair  Current Medications: Current Facility-Administered Medications  Medication Dose Route Frequency Provider Last Rate Last Admin  .  acetaminophen (TYLENOL) tablet 650 mg  650 mg Oral Q6H PRN Patrcia Dolly, FNP   650 mg at 04/20/20 1010  . alum & mag hydroxide-simeth (MAALOX/MYLANTA) 200-200-20 MG/5ML suspension 30 mL  30 mL Oral Q4H PRN Patrcia Dolly, FNP      . gabapentin (NEURONTIN) capsule 200 mg  200 mg Oral TID Antonieta Pert, MD   200 mg at 04/20/20 1203  . hydrOXYzine (ATARAX/VISTARIL) tablet 25 mg  25 mg Oral Q6H PRN Patrcia Dolly, FNP   25 mg at 04/20/20 1203  . loperamide (IMODIUM) capsule 2-4 mg  2-4 mg Oral PRN Patrcia Dolly, FNP      . LORazepam (ATIVAN) tablet 1 mg  1 mg Oral Q6H PRN Patrcia Dolly, FNP   1 mg at 04/20/20 1525  . magnesium hydroxide (MILK OF MAGNESIA) suspension 30 mL  30 mL Oral Daily PRN Patrcia Dolly, FNP      . mirtazapine (REMERON) tablet 15 mg  15 mg Oral QHS March Joos B, FNP      . multivitamin with minerals tablet 1 tablet  1 tablet Oral Daily Patrcia Dolly, FNP   1 tablet at 04/20/20 0843  . ondansetron (ZOFRAN-ODT) disintegrating tablet 4 mg  4 mg Oral Q6H PRN Patrcia Dolly, FNP   4 mg at 04/20/20 1010  . thiamine tablet 100 mg  100 mg Oral Daily Patrcia Dolly, FNP   100 mg at 04/20/20 0843  . traZODone (DESYREL) tablet 50 mg  50 mg Oral QHS PRN Shaune Malacara, Gerlene Burdock, FNP      . venlafaxine XR (EFFEXOR-XR) 24 hr capsule 150 mg  150 mg Oral Q breakfast Antonieta Pert, MD   150 mg at 04/20/20 4665    Lab Results:  Results for orders placed or performed during the hospital encounter of 04/18/20 (from the past 48 hour(s))  Rapid urine drug screen (hospital performed)     Status: Abnormal   Collection Time: 04/19/20  4:05 PM  Result Value Ref Range   Opiates NONE DETECTED NONE DETECTED   Cocaine POSITIVE (A) NONE DETECTED   Benzodiazepines POSITIVE (A) NONE DETECTED   Amphetamines NONE DETECTED NONE DETECTED   Tetrahydrocannabinol NONE DETECTED NONE DETECTED   Barbiturates NONE DETECTED NONE DETECTED    Comment: (NOTE) DRUG SCREEN FOR MEDICAL PURPOSES ONLY.  IF CONFIRMATION IS  NEEDED FOR ANY PURPOSE, NOTIFY LAB WITHIN 5 DAYS. LOWEST DETECTABLE LIMITS FOR URINE DRUG SCREEN Drug Class                     Cutoff (ng/mL) Amphetamine and metabolites    1000 Barbiturate and metabolites  200 Benzodiazepine                 200 Tricyclics and metabolites     300 Opiates and metabolites        300 Cocaine and metabolites        300 THC                            50 Performed at [facility], [address], [place], [place] [address]     Blood Alcohol level:  Lab Results  Component Value Date   ETH 171 (H) 04/17/2020   ETH 231 (H) 09/14/2018    Metabolic Disorder Labs: No results found for: HGBA1C, MPG No results found for: PROLACTIN No results found for: CHOL, TRIG, HDL, CHOLHDL, VLDL, LDLCALC  Physical Findings: AIMS: Facial and Oral Movements Muscles of Facial Expression: None, normal Lips and Perioral Area: None, normal Jaw: None, normal Tongue: None, normal,Extremity Movements Upper (arms, wrists, hands, fingers): None, normal Lower (legs, knees, ankles, toes): None, normal, Trunk Movements Neck, shoulders, hips: None, normal, Overall Severity Severity of abnormal movements (highest score from questions above): None, normal Incapacitation due to abnormal movements: None, normal Patient's awareness of abnormal movements (rate only patient's report): No Awareness, Dental Status Current problems with teeth and/or dentures?: No Does patient usually wear dentures?: No  CIWA:  CIWA-Ar Total: 8 COWS:     Musculoskeletal: Strength & Muscle Tone: within normal limits Gait & Station: normal Patient leans: N/A  Psychiatric Specialty Exam: Physical Exam  Nursing note and vitals reviewed. Constitutional: He is oriented to person, place, and time. He appears well-developed and well-nourished.  Cardiovascular: Normal rate.  Respiratory: Effort normal.  Musculoskeletal:        General: Normal range of motion.  Neurological: He is  alert and oriented to person, place, and time.  Skin: Skin is warm.    Review of Systems  Constitutional: Positive for fatigue.  HENT: Negative.   Eyes: Negative.   Respiratory: Negative.   Cardiovascular: Negative.   Gastrointestinal: Positive for nausea.  Genitourinary: Negative.   Musculoskeletal: Negative.   Skin: Negative.   Neurological: Negative.   Psychiatric/Behavioral: Negative.     Blood pressure (!) 140/110, pulse 85, temperature (!) 97.5 F (36.4 C), temperature source Oral, resp. rate 20, height 6\' 1"  (1.854 m), weight 95.2 kg, SpO2 100 %.Body mass index is 27.7 kg/m.  General Appearance: Disheveled  Eye Contact:  Good  Speech:  Clear and Coherent and Normal Rate  Volume:  Normal  Mood:  Euthymic  Affect:  Congruent  Thought Process:  Coherent and Descriptions of Associations: Intact  Orientation:  Full (Time, Place, and Person)  Thought Content:  WDL  Suicidal Thoughts:  No  Homicidal Thoughts:  No  Memory:  Immediate;   Fair Recent;   Fair Remote;   Fair  Judgement:  Fair  Insight:  Fair  Psychomotor Activity:  Normal  Concentration:  Concentration: Fair  Recall:  of Knowledge:  Fair  Language:  Good  Akathisia:  No  Handed:  Right  AIMS (if indicated):     Assets:  Communication Skills Desire for Improvement Financial Resources/Insurance Housing Physical Health Transportation  ADL's:  Intact  Cognition:  WNL  Sleep:  Number of Hours: 6.5   Assessment: Patient presents in the day room and is interacting with peers and staff appropriately patient has not been aggressive or agitated and has not  shown any signs or symptoms of psychosis.  Patient does not appear to be actively withdrawing during the time and with only objective reports of symptoms.  Patient agrees to continue his current medications.  Discussed assisting him with sleep and decided on using Remeron to assist with sleep, improve his mood, decrease nausea, and improve his  appetite and he is in agreement with this.  Treatment Plan Summary: Daily contact with patient to assess and evaluate symptoms and progress in treatment and Medication management Continue Neurontin 200 mg p.o. 3 times daily for mood stability and alcohol withdrawal symptoms Continue Vistaril 25 mg p.o. every 6 hours as needed for anxiety and CIWA Continue Ativan 1 mg p.o. every 6 hours as needed for CIWA greater than 10 Start Remeron 15 mg p.o. nightly for mood stability and sleep Continue trazodone 50 mg p.o. nightly as needed for insomnia Continue Effexor XR 150 mg p.o. daily with breakfast for MDD Encourage group therapy participation Continue every 15 minute safety checks  Maryfrances Bunnell, FNP 04/20/2020, 3:44 PM

## 2020-04-20 NOTE — Progress Notes (Signed)
D. Pt presents with an anxious affect, mood- complaining of moderate withdrawal symptoms (agitation, tremors, nausea, headache). Per pt's self inventory, pt rated his depression, hopelessness and anxiety a 0/0/10, respectively. Pt reports not having slept well, and writes that his goal today is "trying to rest from not getting good sleep; work on my discharge plan".  Pt currently denies SI/HI and AVH  A. Labs and vitals monitored. Pt compliant with scheduled medications. Administered prn ativan for withdrawal symptoms Pt supported emotionally and encouraged to express concerns and ask questions.   R. Pt remains safe with 15 minute checks. Will continue POC.

## 2020-04-20 NOTE — Progress Notes (Signed)
[facility] NOVEL CORONAVIRUS (COVID-19) DAILY CHECK-OFF SYMPTOMS - answer yes or no to each - every day NO YES  Have you had a fever in the past 24 hours?  . Fever (Temp > 37.80C / 100F) X   Have you had any of these symptoms in the past 24 hours? . New Cough .  Sore Throat  .  Shortness of Breath .  Difficulty Breathing .  Unexplained Body Aches   X   Have you had any one of these symptoms in the past 24 hours not related to allergies?   . Runny Nose .  Nasal Congestion .  Sneezing   X   If you have had runny nose, nasal congestion, sneezing in the past 24 hours, has it worsened?  X   EXPOSURES - check yes or no X   Have you traveled outside the state in the past 14 days?  X   Have you been in contact with someone with a confirmed diagnosis of COVID-19 or PUI in the past 14 days without wearing appropriate PPE?  X   Have you been living in the same home as a person with confirmed diagnosis of COVID-19 or a PUI (household contact)?    X   Have you been diagnosed with COVID-19?    X              What to do next: Answered NO to all: Answered YES to anything:   Proceed with unit schedule Follow the [facility] Inpatient Flowsheet.   

## 2020-04-21 MED ORDER — AMLODIPINE BESYLATE 5 MG PO TABS
5.0000 mg | ORAL_TABLET | Freq: Every day | ORAL | Status: DC
  Administered 2020-04-21 – 2020-04-23 (×3): 5 mg via ORAL
  Filled 2020-04-21 (×6): qty 1

## 2020-04-21 MED ORDER — CLONIDINE HCL 0.1 MG PO TABS
0.1000 mg | ORAL_TABLET | Freq: Once | ORAL | Status: AC
  Administered 2020-04-21: 0.1 mg via ORAL
  Filled 2020-04-21 (×2): qty 1

## 2020-04-21 NOTE — Progress Notes (Signed)
Recreation Therapy Notes  Date:  5.17.21 Time: 0930 Location: [address] Dayroom  Group Topic: Stress Management  Goal Area(s) Addresses:  Patient will identify positive stress management techniques. Patient will identify benefits of using stress management post d/c.  Behavioral Response:  Engaged  Intervention: Stress Management  Activity:  Programmer, applications.  LRT read a script that took patients on a journey through a tropical [place] to the beach.  Patients were to listen and follow along as script was read to engage in activity.    Education: Stress Management, Discharge Planning.   Education Outcome: Acknowledges Education  Clinical Observations/Feedback: Pt attended and participated in group activity.     Caroll Rancher, LRT/CTRS        Caroll Rancher A 04/21/2020 11:17 AM

## 2020-04-21 NOTE — Plan of Care (Signed)
Patient stayed in the milieu until bed time. Cooperative but anxious and restless. Denied suicidal thoughts. Denied hallucinations. Stayed in the dayroom with peers. Had a snack and received bedtime medication. BP 160/109. Provider was notified and Clonidine 0.1 mg  given by mouth.  Had no additional concerns.

## 2020-04-21 NOTE — [facility] Group Notes (Signed)
LCSW Group Therapy Note 04/21/2020 2:29 PM  Type of Therapy and Topic: Group Therapy: Overcoming Obstacles  Participation Level: Active  Description of Group:  In this group patients will be encouraged to explore what they see as obstacles to their own wellness and recovery. They will be guided to discuss their thoughts, feelings, and behaviors related to these obstacles. The group will process together ways to cope with barriers, with attention given to specific choices patients can make. Each patient will be challenged to identify changes they are motivated to make in order to overcome their obstacles. This group will be process-oriented, with patients participating in exploration of their own experiences as well as giving and receiving support and challenge from other group members.  Therapeutic Goals: 1. Patient will identify personal and current obstacles as they relate to admission. 2. Patient will identify barriers that currently interfere with their wellness or overcoming obstacles.  3. Patient will identify feelings, thought process and behaviors related to these barriers. 4. Patient will identify two changes they are willing to make to overcome these obstacles:   Summary of Patient Progress  [name] was engaged and participated throughout the group session. [name] reports that his only obstacles is "not having enough recreational time". [name] reports he is not experiencing any additional obstacles at this time.    Therapeutic Modalities:  Cognitive Behavioral Therapy Solution Focused Therapy Motivational Interviewing Relapse Prevention Therapy   Alcario Drought Clinical Social Worker

## 2020-04-21 NOTE — Progress Notes (Addendum)
[facility] MD Progress Note  04/21/2020 12:43 PM [name]  MRN:  [id number]   Subjective: Patient reports some improvement compared to admission.  Currently he denies suicidal ideations and contracts for safety on unit.  He describes some lingering symptoms of alcohol withdrawal but overall states he is feeling better and that the symptoms have been subsiding.  Currently denies medication side effects. Objective: I discussed case with treatment team and met with patient 29 year old male, history of alcohol and benzodiazepine abuse, with drinking daily and heavily.  Admission BAL was 171.  Presented to ED on 5/13 reporting depression, suicidal thoughts, and requesting help with substance abuse/detox.  He reported he had suicidal ideations and had overdosed on Xanax prior to admission .  He reported finding out that his girlfriend had been unfaithful as a contributing trigger.  Today patient presents alert, attentive, polite on approach. He describes symptoms of alcohol withdrawal as improving gradually.  He does continue to feel subjectively "shaky" but overall states he is feeling noticeably better.  I note that BP remains elevated and he remains mildly tachycardic.  He denies headache or visual disturbances.  Currently no significant tremors or diaphoresis. Patient has been visible in milieu, going to some groups. No disruptive or agitated behaviors on unit.  Principal Problem: MDD (major depressive disorder), recurrent episode, severe ([place]) Diagnosis: Principal Problem:   MDD (major depressive disorder), recurrent episode, severe ([place])  Total Time spent with patient:15 minutes  Past Psychiatric History: Patient has a longstanding past psychiatric history.  At least in our records his first psychiatric admission to our facility was in 2012.  He has been in rehab and detox previously.  He apparently is prescribed Xanax by his report, but the PMP database does not reveal any prescriptions for  controlled substances  Past Medical History:  Past Medical History:  Diagnosis Date  . Alcohol dependence ([place])   . COVID-19   . Suicidal behavior with attempted self-injury [facility])    History reviewed. No pertinent surgical history. Family History: History reviewed. No pertinent family history. Family Psychiatric  History: None reported Social History:  Social History   Substance and Sexual Activity  Alcohol Use Yes  . Alcohol/week: 70.0 standard drinks  . Types: 70 Standard drinks or equivalent per week     Social History   Substance and Sexual Activity  Drug Use Yes  . Types: Benzodiazepines    Social History   Socioeconomic History  . Marital status: Single    Spouse name: Not on file  . Number of children: Not on file  . Years of education: Not on file  . Highest education level: Not on file  Occupational History  . Not on file  Tobacco Use  . Smoking status: Current Every Day Smoker    Types: E-cigarettes  . Smokeless tobacco: Never Used  Substance and Sexual Activity  . Alcohol use: Yes    Alcohol/week: 70.0 standard drinks    Types: 70 Standard drinks or equivalent per week  . Drug use: Yes    Types: Benzodiazepines  . Sexual activity: Not on file  Other Topics Concern  . Not on file  Social History Narrative  . Not on file   Social Determinants of Health   Financial Resource Strain:   . Difficulty of Paying Living Expenses:   Food Insecurity:   . Worried About Charity fundraiser in the Last Year:   . Arboriculturist in the Last Year:   [employer]  Needs:   . Lack of Transportation (Medical):   Marland Kitchen Lack of Transportation (Non-Medical):   Physical Activity:   . Days of Exercise per Week:   . Minutes of Exercise per Session:   Stress:   . Feeling of Stress :   Social Connections:   . Frequency of Communication with Friends and Family:   . Frequency of Social Gatherings with Friends and Family:   . Attends Religious Services:   . Active  Member of Clubs or Organizations:   . Attends Archivist Meetings:   Marland Kitchen Marital Status:    Additional Social History:   Sleep: Improving  Appetite:  Fair/improving  Current Medications: Current Facility-Administered Medications  Medication Dose Route Frequency Provider Last Rate Last Admin  . acetaminophen (TYLENOL) tablet 650 mg  650 mg Oral Q6H PRN Emmaline Kluver, FNP   650 mg at 04/20/20 1010  . alum & mag hydroxide-simeth (MAALOX/MYLANTA) 200-200-20 MG/5ML suspension 30 mL  30 mL Oral Q4H PRN Emmaline Kluver, FNP      . gabapentin (NEURONTIN) capsule 200 mg  200 mg Oral TID Sharma Covert, MD   200 mg at 04/21/20 1208  . hydrOXYzine (ATARAX/VISTARIL) tablet 25 mg  25 mg Oral Q6H PRN Emmaline Kluver, FNP   25 mg at 04/20/20 1203  . loperamide (IMODIUM) capsule 2-4 mg  2-4 mg Oral PRN Emmaline Kluver, FNP      . LORazepam (ATIVAN) tablet 1 mg  1 mg Oral Q6H PRN Emmaline Kluver, FNP   1 mg at 04/21/20 0623  . magnesium hydroxide (MILK OF MAGNESIA) suspension 30 mL  30 mL Oral Daily PRN Emmaline Kluver, FNP      . mirtazapine (REMERON) tablet 15 mg  15 mg Oral QHS Money, Lowry Ram, FNP   15 mg at 04/20/20 2122  . multivitamin with minerals tablet 1 tablet  1 tablet Oral Daily Emmaline Kluver, FNP   1 tablet at 04/21/20 7628  . ondansetron (ZOFRAN-ODT) disintegrating tablet 4 mg  4 mg Oral Q6H PRN Emmaline Kluver, FNP   4 mg at 04/21/20 0816  . thiamine tablet 100 mg  100 mg Oral Daily Emmaline Kluver, FNP   100 mg at 04/21/20 3151  . traZODone (DESYREL) tablet 50 mg  50 mg Oral QHS PRN Money, Lowry Ram, FNP      . venlafaxine XR (EFFEXOR-XR) 24 hr capsule 150 mg  150 mg Oral Q breakfast Sharma Covert, MD   150 mg at 04/21/20 7616    Lab Results:  Results for orders placed or performed during the hospital encounter of 04/18/20 (from the past 48 hour(s))  Rapid urine drug screen (hospital performed)     Status: Abnormal   Collection Time: 04/19/20  4:05 PM  Result Value Ref Range   Opiates NONE  DETECTED NONE DETECTED   Cocaine POSITIVE (A) NONE DETECTED   Benzodiazepines POSITIVE (A) NONE DETECTED   Amphetamines NONE DETECTED NONE DETECTED   Tetrahydrocannabinol NONE DETECTED NONE DETECTED   Barbiturates NONE DETECTED NONE DETECTED    Comment: (NOTE) DRUG SCREEN FOR MEDICAL PURPOSES ONLY.  IF CONFIRMATION IS NEEDED FOR ANY PURPOSE, NOTIFY LAB WITHIN 5 DAYS. LOWEST DETECTABLE LIMITS FOR URINE DRUG SCREEN Drug Class                     Cutoff (ng/mL) Amphetamine and metabolites    1000 Barbiturate and metabolites    200 Benzodiazepine  161 Tricyclics and metabolites     300 Opiates and metabolites        300 Cocaine and metabolites        300 THC                            50 Performed at [facility], [place] [address] [place], [place] [address]     Blood Alcohol level:  Lab Results  Component Value Date   ETH 171 (H) 04/17/2020   ETH 231 (H) 54/08/8118    Metabolic Disorder Labs: No results found for: HGBA1C, MPG No results found for: PROLACTIN No results found for: CHOL, TRIG, HDL, CHOLHDL, VLDL, LDLCALC  Physical Findings: AIMS: Facial and Oral Movements Muscles of Facial Expression: None, normal Lips and Perioral Area: None, normal Jaw: None, normal Tongue: None, normal,Extremity Movements Upper (arms, wrists, hands, fingers): None, normal Lower (legs, knees, ankles, toes): None, normal, Trunk Movements Neck, shoulders, hips: None, normal, Overall Severity Severity of abnormal movements (highest score from questions above): None, normal Incapacitation due to abnormal movements: None, normal Patient's awareness of abnormal movements (rate only patient's report): No Awareness, Dental Status Current problems with teeth and/or dentures?: No Does patient usually wear dentures?: No  CIWA:  CIWA-Ar Total: 4 COWS:  COWS Total Score: 3  Musculoskeletal: Strength & Muscle Tone: within normal limits no current tremors or  diaphoresis noted, no overt psychomotor restlessness, does not appear to be in any acute distress. Gait & Station: normal Patient leans: N/A  Psychiatric Specialty Exam: Physical Exam  Nursing note and vitals reviewed. Constitutional: He is oriented to person, place, and time. He appears well-developed and well-nourished.  Cardiovascular: Normal rate.  Respiratory: Effort normal.  Musculoskeletal:        General: Normal range of motion.  Neurological: He is alert and oriented to person, place, and time.  Skin: Skin is warm.    Review of Systems  Constitutional: Positive for fatigue.  HENT: Negative.   Eyes: Negative.   Respiratory: Negative.   Cardiovascular: Negative.   Gastrointestinal: Positive for nausea.  Genitourinary: Negative.   Musculoskeletal: Negative.   Skin: Negative.   Neurological: Negative.   Psychiatric/Behavioral: Negative.   No chest pain, no shortness of breath at room air, no vomiting  Blood pressure (!) 146/109, pulse (!) 106, temperature 97.8 F (36.6 C), temperature source Oral, resp. rate 20, height 6' 1" (1.854 m), weight 95.2 kg, SpO2 100 %.Body mass index is 27.7 kg/m.  General Appearance: Improving grooming  Eye Contact:  Good  Speech:  Clear and Coherent and Normal Rate  Volume:  Normal  Mood:  Reports partially improved mood, remains vaguely depressed and apprehensive but affect tends to improve during session  Affect:  Appropriate, vaguely anxious but reactive smiles briefly at times appropriately  Thought Process:  Linear and Descriptions of Associations: Intact  Orientation:  Other:  Fully alert and attentive  Thought Content:  No hallucinations, no delusions, not internally preoccupied  Suicidal Thoughts:  No denies suicidal or self-injurious ideations  Homicidal Thoughts:  No  Memory:  Recent and remote grossly intact  Judgement:  Fair/improving  Insight:  Fair  Psychomotor Activity:  Normal no significant tremors, no diaphoresis, no  psychomotor restlessness  Concentration:  Concentration: Good and Attention Span: Good  Recall:  Good  Fund of Knowledge:  Good  Language:  Good  Akathisia:  No  Handed:  Right  AIMS (if indicated):  Assets:  Communication Skills Desire for Improvement Financial Resources/Insurance Housing Physical Health Transportation  ADL's:  Intact  Cognition:  WNL  Sleep:  Number of Hours: 6.75   Assessment:  28-year-old male, history of alcohol and benzodiazepine abuse, with drinking daily and heavily.  Admission BAL was 171.  Presented to ED on 5/13 reporting depression, suicidal thoughts, and requesting help with substance abuse/detox.  He reported he had suicidal ideations and had overdosed on Xanax prior to admission .  He reported finding out that his girlfriend had been unfaithful as a contributing trigger.  Today patient describes some improvement compared to admission.  He does endorse some lingering symptoms of alcohol withdrawal but states that they have subsided significantly since admission and currently presents calm/comfortable and in no acute distress.  He is tolerating medications well and denies side effects.  He does present with some persistently elevated BP and is mildly tachycardic (BP 146/109, pulse 106).  He is currently on Effexor XR which he states was started a few weeks prior to admission and has tolerated well thus far, Neurontin, Remeron. Treatment Plan Summary: Daily contact with patient to assess and evaluate symptoms and progress in treatment and Medication management  Treatment plan reviewed as below today 5/17 Encourage group and milieu participation to work on coping skills and symptom reduction Encourage efforts to work on sobriety/relapse prevention Treatment team working on disposition planning options Continue Neurontin 200 mg p.o. 3 times daily for mood stability and alcohol withdrawal symptoms Continue Vistaril 25 mg p.o. every 6 hours as needed for  anxiety and CIWA Continue Ativan 1 mg p.o. every 6 hours as needed for CIWA greater than 10 Continue Remeron 15 mg p.o. nightly for depression, anxiety, and sleep Discontinue Trazodone  Continue Effexor XR 150 mg p.o. daily with breakfast for depression, anxiety *As noted patient remains hypertensive  in spite of improving symptoms of withdrawal.  Have reviewed with hospitalist, it is felt patient might have concomitant hypertension, recommendation is to start amlodipine 5 mg daily.  Fernando A Cobos, MD 04/21/2020, 12:43 PM   Patient ID: [name], male   DOB: 03/05/1991, 28 y.o.   MRN: [id number]  

## 2020-04-21 NOTE — Tx Team (Signed)
Interdisciplinary Treatment and Diagnostic Plan Update  04/21/2020 Time of Session: 9:40am [name] MRN: [id number]  Principal Diagnosis: MDD (major depressive disorder), recurrent episode, severe (HCC)  Secondary Diagnoses: Principal Problem:   MDD (major depressive disorder), recurrent episode, severe (HCC)   Current Medications:  Current Facility-Administered Medications  Medication Dose Route Frequency Provider Last Rate Last Admin  . acetaminophen (TYLENOL) tablet 650 mg  650 mg Oral Q6H PRN Patrcia Dolly, FNP   650 mg at 04/20/20 1010  . alum & mag hydroxide-simeth (MAALOX/MYLANTA) 200-200-20 MG/5ML suspension 30 mL  30 mL Oral Q4H PRN Patrcia Dolly, FNP      . gabapentin (NEURONTIN) capsule 200 mg  200 mg Oral TID Antonieta Pert, MD   200 mg at 04/21/20 8882  . hydrOXYzine (ATARAX/VISTARIL) tablet 25 mg  25 mg Oral Q6H PRN Patrcia Dolly, FNP   25 mg at 04/20/20 1203  . loperamide (IMODIUM) capsule 2-4 mg  2-4 mg Oral PRN Patrcia Dolly, FNP      . LORazepam (ATIVAN) tablet 1 mg  1 mg Oral Q6H PRN Patrcia Dolly, FNP   1 mg at 04/21/20 8003  . magnesium hydroxide (MILK OF MAGNESIA) suspension 30 mL  30 mL Oral Daily PRN Patrcia Dolly, FNP      . mirtazapine (REMERON) tablet 15 mg  15 mg Oral QHS Money, Gerlene Burdock, FNP   15 mg at 04/20/20 2122  . multivitamin with minerals tablet 1 tablet  1 tablet Oral Daily Patrcia Dolly, FNP   1 tablet at 04/21/20 4917  . ondansetron (ZOFRAN-ODT) disintegrating tablet 4 mg  4 mg Oral Q6H PRN Patrcia Dolly, FNP   4 mg at 04/21/20 0816  . thiamine tablet 100 mg  100 mg Oral Daily Patrcia Dolly, FNP   100 mg at 04/21/20 9150  . traZODone (DESYREL) tablet 50 mg  50 mg Oral QHS PRN Money, Gerlene Burdock, FNP      . venlafaxine XR (EFFEXOR-XR) 24 hr capsule 150 mg  150 mg Oral Q breakfast Antonieta Pert, MD   150 mg at 04/21/20 5697   PTA Medications: Medications Prior to Admission  Medication Sig Dispense Refill Last Dose  . folic acid (FOLVITE) 1 MG  tablet Take 1 tablet (1 mg total) by mouth daily. (Patient not taking: Reported on 04/17/2020) 30 tablet 0   . thiamine 100 MG tablet Take 1 tablet (100 mg total) by mouth daily. (Patient not taking: Reported on 04/17/2020) 30 tablet 0   . venlafaxine XR (EFFEXOR-XR) 150 MG 24 hr capsule Take 150 mg by mouth every morning.       Patient Stressors: Occupational concerns Substance abuse Other: "My girlfriend cheated on me"  Patient Strengths: Ability for insight Capable of independent living Psychologist, counselling means Physical Health Supportive family/friends Work skills  Treatment Modalities: Medication Management, Group therapy, Case management,  1 to 1 session with clinician, Psychoeducation, Recreational therapy.   Physician Treatment Plan for Primary Diagnosis: MDD (major depressive disorder), recurrent episode, severe (HCC) Long Term Goal(s): Improvement in symptoms so as ready for discharge Improvement in symptoms so as ready for discharge   Short Term Goals: Ability to identify changes in lifestyle to reduce recurrence of condition will improve Ability to verbalize feelings will improve Ability to disclose and discuss suicidal ideas Ability to demonstrate self-control will improve Ability to identify and develop effective coping behaviors will improve Ability to maintain clinical measurements within normal limits  will improve Ability to identify triggers associated with substance abuse/mental health issues will improve Ability to identify changes in lifestyle to reduce recurrence of condition will improve Ability to verbalize feelings will improve Ability to disclose and discuss suicidal ideas Ability to demonstrate self-control will improve Ability to identify and develop effective coping behaviors will improve Ability to maintain clinical measurements within normal limits will improve Ability to identify triggers associated with substance abuse/mental health  issues will improve  Medication Management: Evaluate patient's response, side effects, and tolerance of medication regimen.  Therapeutic Interventions: 1 to 1 sessions, Unit Group sessions and Medication administration.  Evaluation of Outcomes: Progressing  Physician Treatment Plan for Secondary Diagnosis: Principal Problem:   MDD (major depressive disorder), recurrent episode, severe ([place])  Long Term Goal(s): Improvement in symptoms so as ready for discharge Improvement in symptoms so as ready for discharge   Short Term Goals: Ability to identify changes in lifestyle to reduce recurrence of condition will improve Ability to verbalize feelings will improve Ability to disclose and discuss suicidal ideas Ability to demonstrate self-control will improve Ability to identify and develop effective coping behaviors will improve Ability to maintain clinical measurements within normal limits will improve Ability to identify triggers associated with substance abuse/mental health issues will improve Ability to identify changes in lifestyle to reduce recurrence of condition will improve Ability to verbalize feelings will improve Ability to disclose and discuss suicidal ideas Ability to demonstrate self-control will improve Ability to identify and develop effective coping behaviors will improve Ability to maintain clinical measurements within normal limits will improve Ability to identify triggers associated with substance abuse/mental health issues will improve     Medication Management: Evaluate patient's response, side effects, and tolerance of medication regimen.  Therapeutic Interventions: 1 to 1 sessions, Unit Group sessions and Medication administration.  Evaluation of Outcomes: Progressing   RN Treatment Plan for Primary Diagnosis: MDD (major depressive disorder), recurrent episode, severe ([place]) Long Term Goal(s): Knowledge of disease and therapeutic regimen to maintain health will  improve  Short Term Goals: Ability to participate in decision making will improve, Ability to disclose and discuss suicidal ideas, Ability to identify and develop effective coping behaviors will improve and Compliance with prescribed medications will improve  Medication Management: RN will administer medications as ordered by provider, will assess and evaluate patient's response and provide education to patient for prescribed medication. RN will report any adverse and/or side effects to prescribing provider.  Therapeutic Interventions: 1 on 1 counseling sessions, Psychoeducation, Medication administration, Evaluate responses to treatment, Monitor vital signs and CBGs as ordered, Perform/monitor CIWA, COWS, AIMS and Fall Risk screenings as ordered, Perform wound care treatments as ordered.  Evaluation of Outcomes: Progressing   LCSW Treatment Plan for Primary Diagnosis: MDD (major depressive disorder), recurrent episode, severe ([place]) Long Term Goal(s): Safe transition to appropriate next level of care at discharge, Engage patient in therapeutic group addressing interpersonal concerns.  Short Term Goals: Engage patient in aftercare planning with referrals and resources  Therapeutic Interventions: Assess for all discharge needs, 1 to 1 time with Social worker, Explore available resources and support systems, Assess for adequacy in community support network, Educate family and significant other(s) on suicide prevention, Complete Psychosocial Assessment, Interpersonal group therapy.  Evaluation of Outcomes: Progressing   Progress in Treatment: Attending groups: Yes. Participating in groups: Yes. Taking medication as prescribed: Yes. Toleration medication: Yes. Family/Significant other contact made: No, will contact:  the patient's mother Patient understands diagnosis: Yes. Discussing patient identified problems/goals with staff:  Yes. Medical problems stabilized or resolved: Yes. Denies  suicidal/homicidal ideation: Yes. Issues/concerns per patient self-inventory: No. Other:   New problem(s) identified: None   New Short Term/Long Term Goal(s): Detox, medication stabilization, elimination of SI thoughts, development of comprehensive mental wellness plan.    Patient Goals: "To prolong my sobriety"   Discharge Plan or Barriers: Patient plans to return home. He expressed interest in CDIOP services at discharge.  CSW will continue to follow and assess for appropriate referrals and possible discharge planning.    Reason for Continuation of Hospitalization: Anxiety Depression Medication stabilization Suicidal ideation  Estimated Length of Stay: 3-5 days   Attendees: Patient: [name]  04/21/2020 11:20 AM  Physician:  04/21/2020 11:20 AM  Nursing:  04/21/2020 11:20 AM  RN Care Manager: 04/21/2020 11:20 AM  Social Worker: Baldo Daub, LCSW 04/21/2020 11:20 AM  Recreational Therapist:  04/21/2020 11:20 AM  Other: Marciano Sequin, NP 04/21/2020 11:20 AM  Other:  04/21/2020 11:20 AM  Other: 04/21/2020 11:20 AM    Scribe for Treatment Team: Maeola Sarah, LCSWA 04/21/2020 11:20 AM

## 2020-04-21 NOTE — Progress Notes (Signed)
   04/21/20 0106  Psych Admission Type (Psych Patients Only)  Admission Status Voluntary  Psychosocial Assessment  Patient Complaints Substance abuse  Eye Contact Fair  Facial Expression Anxious  Affect Appropriate to circumstance  Speech Logical/coherent  Interaction Assertive  Motor Activity Restless  Appearance/Hygiene Unremarkable  Behavior Characteristics Appropriate to situation  Mood Anxious;Pleasant  Thought Process  Coherency WDL  Content WDL  Delusions None reported or observed  Perception WDL  Hallucination None reported or observed  Judgment WDL  Confusion None  Danger to Self  Current suicidal ideation? Denies  Danger to Others  Danger to Others None reported or observed

## 2020-04-21 NOTE — Progress Notes (Addendum)
   04/21/20 0950  Psych Admission Type (Psych Patients Only)  Admission Status Voluntary  Psychosocial Assessment  Patient Complaints Substance abuse  Eye Contact Fair  Facial Expression Anxious  Affect Appropriate to circumstance  Speech Logical/coherent  Interaction Assertive  Motor Activity Restless;Fidgety  Appearance/Hygiene Unremarkable  Behavior Characteristics Cooperative  Mood Anxious;Pleasant  Thought Process  Coherency WDL  Content WDL  Delusions None reported or observed  Perception WDL  Hallucination None reported or observed  Judgment WDL  Confusion None  Danger to Self  Current suicidal ideation? Denies  Danger to Others  Danger to Others None reported or observed   Pt A & O X4. Denies SI, HI, AVH and pain when assessed. Reported active symptoms of withdrawal earlier this shift "I could not eat my breakfast because I was very nauseous and felt like throwing up, I feel cold sometimes with some sweats and I'm very anxious". Pt BP remains elevated, however he denies headaches or feeling light headedness. Remains medication compliant, attends scheduled groups and interacts well with peers and staff.  Q 15 minutes safety checks maintained without self harm gestures or outburst. Assigned provided made aware of elevated BP and new order received for Norvasc (see EMAR) which was administered as ordered with minimal effect.  Emotional support and encouragement offered as needed throughout this shift.  Pt remains safe on and off unit. Tolerates all PO intake well.

## 2020-04-22 MED ORDER — CLONIDINE HCL 0.1 MG PO TABS
0.1000 mg | ORAL_TABLET | Freq: Once | ORAL | Status: AC
  Administered 2020-04-22: 0.1 mg via ORAL
  Filled 2020-04-22 (×2): qty 1

## 2020-04-22 MED ORDER — BUSPIRONE HCL 5 MG PO TABS
5.0000 mg | ORAL_TABLET | Freq: Three times a day (TID) | ORAL | Status: DC
  Administered 2020-04-22 – 2020-04-23 (×4): 5 mg via ORAL
  Filled 2020-04-22 (×9): qty 1

## 2020-04-22 NOTE — Progress Notes (Signed)
[facility] MD Progress Note  04/22/2020 2:21 PM [name]  MRN:  [id number] Subjective:  "I'm anxious."  [name] found sitting in his room. He reports nausea has improved but continues to complain of anxiety, tremors, and irritability from alcohol withdrawal. He reports history of anxiety and states he is typically anxious while sober from substances as well. He was started on gabapentin during hospitalization but states that his mother had increased anxiety when she took gabapentin, and he feels this medication is not helpful for him either. He reports mood is improved, but anxiety levels remain high. He denies SI. He admits to depression related to stressors prior to admission, but denies recent SI and states suicide attempt was made in the context of intoxication. He and his girlfriend have resolved their conflict, and he intends to return home at discharge.   LFTs were elevated on admission. Patient reports regular use of benzodiazepines, cocaine, and alcohol prior to admission but denies IVDU and denies history of liver disease.  From admission H&P: Patient is a 29 year old male with a past psychiatric history significant for polysubstance dependence including alcohol and benzodiazepines who presented to the [facility] emergency department on 04/17/2020 with request for detox and possible suicidal ideation. The patient reported that on 5/12 while intoxicated he took an unknown amount of Xanax with the intention of ending his life.   Principal Problem: MDD (major depressive disorder), recurrent episode, severe ([place]) Diagnosis: Principal Problem:   MDD (major depressive disorder), recurrent episode, severe ([place])  Total Time spent with patient: 20 minutes  Past Psychiatric History: See admission H&P  Past Medical History:  Past Medical History:  Diagnosis Date  . Alcohol dependence ([place])   . COVID-19   . Suicidal behavior with attempted self-injury [facility])    History  reviewed. No pertinent surgical history. Family History: History reviewed. No pertinent family history. Family Psychiatric  History: See admission H&P Social History:  Social History   Substance and Sexual Activity  Alcohol Use Yes  . Alcohol/week: 70.0 standard drinks  . Types: 70 Standard drinks or equivalent per week     Social History   Substance and Sexual Activity  Drug Use Yes  . Types: Benzodiazepines    Social History   Socioeconomic History  . Marital status: Single    Spouse name: Not on file  . Number of children: Not on file  . Years of education: Not on file  . Highest education level: Not on file  Occupational History  . Not on file  Tobacco Use  . Smoking status: Current Every Day Smoker    Types: E-cigarettes  . Smokeless tobacco: Never Used  Substance and Sexual Activity  . Alcohol use: Yes    Alcohol/week: 70.0 standard drinks    Types: 70 Standard drinks or equivalent per week  . Drug use: Yes    Types: Benzodiazepines  . Sexual activity: Not on file  Other Topics Concern  . Not on file  Social History Narrative  . Not on file   Social Determinants of Health   Financial Resource Strain:   . Difficulty of Paying Living Expenses:   Food Insecurity:   . Worried About Charity fundraiser in the Last Year:   . Arboriculturist in the Last Year:   Transportation Needs:   . Film/video editor (Medical):   Marland Kitchen Lack of Transportation (Non-Medical):   Physical Activity:   . Days of Exercise per Week:   .  Minutes of Exercise per Session:   Stress:   . Feeling of Stress :   Social Connections:   . Frequency of Communication with Friends and Family:   . Frequency of Social Gatherings with Friends and Family:   . Attends Religious Services:   . Active Member of Clubs or Organizations:   . Attends Banker Meetings:   Marland Kitchen Marital Status:    Additional Social History:                         Sleep: Good  Appetite:   Good  Current Medications: Current Facility-Administered Medications  Medication Dose Route Frequency Provider Last Rate Last Admin  . amLODipine (NORVASC) tablet 5 mg  5 mg Oral Daily Cobos, Rockey Situ, MD   5 mg at 04/22/20 1497  . busPIRone (BUSPAR) tablet 5 mg  5 mg Oral TID Aldean Baker, NP   5 mg at 04/22/20 1222  . mirtazapine (REMERON) tablet 15 mg  15 mg Oral QHS Money, Gerlene Burdock, FNP   15 mg at 04/21/20 2116  . multivitamin with minerals tablet 1 tablet  1 tablet Oral Daily Patrcia Dolly, FNP   1 tablet at 04/22/20 0814  . thiamine tablet 100 mg  100 mg Oral Daily Patrcia Dolly, FNP   100 mg at 04/22/20 0815  . venlafaxine XR (EFFEXOR-XR) 24 hr capsule 150 mg  150 mg Oral Q breakfast Antonieta Pert, MD   150 mg at 04/22/20 0263    Lab Results: No results found for this or any previous visit (from the past 48 hour(s)).  Blood Alcohol level:  Lab Results  Component Value Date   ETH 171 (H) 04/17/2020   ETH 231 (H) 09/14/2018    Metabolic Disorder Labs: No results found for: HGBA1C, MPG No results found for: PROLACTIN No results found for: CHOL, TRIG, HDL, CHOLHDL, VLDL, LDLCALC  Physical Findings: AIMS: Facial and Oral Movements Muscles of Facial Expression: None, normal Lips and Perioral Area: None, normal Jaw: None, normal Tongue: None, normal,Extremity Movements Upper (arms, wrists, hands, fingers): None, normal Lower (legs, knees, ankles, toes): None, normal, Trunk Movements Neck, shoulders, hips: None, normal, Overall Severity Severity of abnormal movements (highest score from questions above): None, normal Incapacitation due to abnormal movements: None, normal Patient's awareness of abnormal movements (rate only patient's report): No Awareness, Dental Status Current problems with teeth and/or dentures?: No Does patient usually wear dentures?: No  CIWA:  CIWA-Ar Total: 1 COWS:  COWS Total Score: 3  Musculoskeletal: Strength & Muscle Tone: within normal  limits Gait & Station: normal Patient leans: N/A  Psychiatric Specialty Exam: Physical Exam  Nursing note and vitals reviewed. Constitutional: He is oriented to person, place, and time. He appears well-developed and well-nourished.  Cardiovascular: Normal rate.  Respiratory: Effort normal.  Neurological: He is alert and oriented to person, place, and time.    Review of Systems  Constitutional: Negative.   Respiratory: Negative for cough and shortness of breath.   Gastrointestinal: Negative for diarrhea, nausea and vomiting.  Neurological: Positive for tremors. Negative for seizures, light-headedness and headaches.  Psychiatric/Behavioral: Positive for dysphoric mood. Negative for agitation, behavioral problems, confusion, hallucinations, self-injury, sleep disturbance and suicidal ideas. The patient is nervous/anxious. The patient is not hyperactive.     Blood pressure (!) 143/93, pulse 81, temperature 98.4 F (36.9 C), temperature source Oral, resp. rate 20, height 6\' 1"  (1.854 m), weight 95.2 kg, SpO2 100 %.Body  mass index is 27.7 kg/m.  General Appearance: Casual  Eye Contact:  Good  Speech:  Normal Rate  Volume:  Normal  Mood:  Anxious  Affect:  Congruent  Thought Process:  Coherent  Orientation:  Full (Time, Place, and Person)  Thought Content:  Logical  Suicidal Thoughts:  No  Homicidal Thoughts:  No  Memory:  Immediate;   Fair Recent;   Fair  Judgement:  Intact  Insight:  Fair  Psychomotor Activity:  Increased  Concentration:  Concentration: Fair and Attention Span: Fair  Recall:  [employer] of Knowledge:  Fair  Language:  Good  Akathisia:  No  Handed:  Right  AIMS (if indicated):     Assets:  Communication Skills Desire for Improvement Housing Vocational/Educational  ADL's:  Intact  Cognition:  WNL  Sleep:  Number of Hours: 6.75     Treatment Plan Summary: Daily contact with patient to assess and evaluate symptoms and progress in treatment and  Medication management   Continue inpatient hospitalization. Recheck LFTs.  Start Buspar 5 mg PO TID for anxiety Discontinue Neurontin Continue Effexor XR 150 mg PO daily for anxiety Continue Remeron 15 mg PO QHS for mood/sleep Continue thiamine 100 mg PO daily for supplementation Continue Norvasc 5 mg PO daily for HTN  Patient will participate in the therapeutic group milieu.  Discharge disposition in progress.   Aldean Baker, NP 04/22/2020, 2:21 PM

## 2020-04-22 NOTE — Progress Notes (Signed)
Patient out in the milieu, alert and oriented. Denying self harm thoughts. Denying hallucinations. Reports that he did not have any major concern throughout the day. BP 147/113  Pulse 92. Provider notified for follow up.

## 2020-04-22 NOTE — Progress Notes (Signed)
Recreation Therapy Notes  Animal-Assisted Activity (AAA) Program Checklist/Progress Notes Patient Eligibility Criteria Checklist & Daily Group note for Rec Tx Intervention  Date: 5.18.21 Time: 1430 Location: [address]   AAA/T Program Assumption of Risk Form signed by Engineer, production or Parent Legal Guardian  YES   Patient is free of allergies or sever asthma  YES   Patient reports no fear of animals  YES   Patient reports no history of cruelty to animals YES   Patient understands his/her participation is voluntary YES  Patient washes hands before animal contact  YES   Patient washes hands after animal contact  YES   Behavioral Response: Engaged  Education: Charity fundraiser, Appropriate Animal Interaction   Education Outcome: Acknowledges understanding/In group clarification offered/Needs additional education.   Clinical Observations/Feedback: Pt attended and participated in group activity.    Caroll Rancher, LRT/CTRS         Caroll Rancher A 04/22/2020 3:29 PM

## 2020-04-22 NOTE — Progress Notes (Signed)
   04/22/20 0900  Psych Admission Type (Psych Patients Only)  Admission Status Voluntary  Psychosocial Assessment  Patient Complaints Anxiety  Eye Contact Fair  Facial Expression Anxious  Affect Appropriate to circumstance  Speech Logical/coherent  Interaction Assertive  Motor Activity Restless  Appearance/Hygiene Unremarkable  Behavior Characteristics Cooperative  Mood Anxious  Thought Process  Coherency WDL  Content WDL  Delusions WDL;None reported or observed  Perception WDL  Hallucination None reported or observed  Judgment WDL  Confusion None  Danger to Self  Current suicidal ideation? Denies  Danger to Others  Danger to Others None reported or observed

## 2020-04-22 NOTE — Progress Notes (Signed)
Patient out of bed and reports that he slept fine. BP 154/118. NP notified  And AM medication (Norvasc) given. Reports mild anxiety. No additional symptoms.

## 2020-04-23 LAB — HEPATIC FUNCTION PANEL
ALT: 119 U/L — ABNORMAL HIGH (ref 0–44)
AST: 61 U/L — ABNORMAL HIGH (ref 15–41)
Albumin: 4.4 g/dL (ref 3.5–5.0)
Alkaline Phosphatase: 59 U/L (ref 38–126)
Bilirubin, Direct: 0.1 mg/dL (ref 0.0–0.2)
Indirect Bilirubin: 0.9 mg/dL (ref 0.3–0.9)
Total Bilirubin: 1 mg/dL (ref 0.3–1.2)
Total Protein: 8.1 g/dL (ref 6.5–8.1)

## 2020-04-23 MED ORDER — BUSPIRONE HCL 5 MG PO TABS: 5.0000 mg | ORAL_TABLET | Freq: Three times a day (TID) | ORAL | 0 refills | Status: DC

## 2020-04-23 MED ORDER — AMLODIPINE BESYLATE 5 MG PO TABS: 5.0000 mg | ORAL_TABLET | Freq: Every day | ORAL | 0 refills | Status: DC

## 2020-04-23 MED ORDER — MIRTAZAPINE 15 MG PO TABS: 15.0000 mg | ORAL_TABLET | Freq: Every day | ORAL | 0 refills | Status: DC

## 2020-04-23 MED ORDER — VENLAFAXINE HCL ER 150 MG PO CP24: 150.0000 mg | ORAL_CAPSULE | Freq: Every day | ORAL | 0 refills | Status: DC

## 2020-04-23 NOTE — Progress Notes (Signed)
Discharge Note:  Patient denies SI/HI AVH at this time. Discharge instructions, AVS, prescriptions and transition record gone over with patient. Patient agrees to comply with medication management, follow-up visit, and outpatient therapy. Patient belongings returned to patient. Patient questions and concerns addressed and answered.  Patient ambulatory off unit.  Patient discharged to home with friend.   

## 2020-04-23 NOTE — Discharge Summary (Signed)
Physician Discharge Summary Note  Patient:  [name] is an 29 y.o., male MRN:  [id number] DOB:  08/15/1991 Patient phone:  [phone] (home)  Patient address:   [address] [place] [address],  Total Time spent with patient: 30 minutes  Date of Admission:  04/18/2020 Date of Discharge: 04/23/20  Reason for Admission:  29 year old male with a past psychiatric history significant for polysubstance dependence including alcohol and benzodiazepines who presented to the [facility] emergency department on 04/17/2020 with request for detox and possible suicidal ideation. The patient reported that on 5/12 while intoxicated he took an unknown amount of Xanax with the intention of ending his life. He stated he felt as though he was going into withdrawal and had anxiety. He identified several life stressors including his girlfriend cheating on him, being short staffed at work, and raising a 86-year-old child.   Principal Problem: MDD (major depressive disorder), recurrent episode, severe ([facility]) Discharge Diagnoses: Principal Problem:   MDD (major depressive disorder), recurrent episode, severe ([facility])   Past Psychiatric History: Patient has a longstanding past psychiatric history.  At least in our records his first psychiatric admission to our facility was in 2012.  He has been in rehab and detox previously.  He apparently is prescribed Xanax by his report, but the PMP database does not reveal any prescriptions for controlled substances.  Past Medical History:  Past Medical History:  Diagnosis Date  . Alcohol dependence ([facility])   . COVID-19   . Suicidal behavior with attempted self-injury [facility])    History reviewed. No pertinent surgical history. Family History: History reviewed. No pertinent family history. Family Psychiatric  History: None reported Social History:  Social History   Substance and Sexual Activity  Alcohol Use Yes  . Alcohol/week: 70.0 standard  drinks  . Types: 70 Standard drinks or equivalent per week     Social History   Substance and Sexual Activity  Drug Use Yes  . Types: Benzodiazepines    Social History   Socioeconomic History  . Marital status: Single    Spouse name: Not on file  . Number of children: Not on file  . Years of education: Not on file  . Highest education level: Not on file  Occupational History  . Not on file  Tobacco Use  . Smoking status: Current Every Day Smoker    Types: E-cigarettes  . Smokeless tobacco: Never Used  Substance and Sexual Activity  . Alcohol use: Yes    Alcohol/week: 70.0 standard drinks    Types: 70 Standard drinks or equivalent per week  . Drug use: Yes    Types: Benzodiazepines  . Sexual activity: Not on file  Other Topics Concern  . Not on file  Social History Narrative  . Not on file   Social Determinants of Health   Financial Resource Strain:   . Difficulty of Paying Living Expenses:   Food Insecurity:   . Worried About Programme researcher, broadcasting/film/video in the Last Year:   . Barista in the Last Year:   Transportation Needs:   . Freight forwarder (Medical):   Marland Kitchen Lack of Transportation (Non-Medical):   Physical Activity:   . Days of Exercise per Week:   . Minutes of Exercise per Session:   Stress:   . Feeling of Stress :   Social Connections:   . Frequency of Communication with Friends and Family:   . Frequency of Social Gatherings with Friends and Family:   .  Attends Religious Services:   . Active Member of Clubs or Organizations:   . Attends Banker Meetings:   Marland Kitchen Marital Status:     Hospital Course:  Patient remained on the [facility] unit for 4 days. The patient stabilized on medication and therapy. Patient was discharged on Norvasc 5 mg Daily, Buspar 5 mg TID, Remeron 15 mg QHS, Effexor XR 150 mg Daily. Patient has shown improvement with improved mood, affect, sleep, appetite, and interaction. Patient has attended group and participated. Patient  has been seen in the day room interacting with peers and staff appropriately. Patient denies any SI/HI/AVH and contracts for safety. Patient agrees to follow up at [facility]. Patient is provided with prescriptions for their medications upon discharge.  Physical Findings: AIMS: Facial and Oral Movements Muscles of Facial Expression: None, normal Lips and Perioral Area: None, normal Jaw: None, normal Tongue: None, normal,Extremity Movements Upper (arms, wrists, hands, fingers): None, normal Lower (legs, knees, ankles, toes): None, normal, Trunk Movements Neck, shoulders, hips: None, normal, Overall Severity Severity of abnormal movements (highest score from questions above): None, normal Incapacitation due to abnormal movements: None, normal Patient's awareness of abnormal movements (rate only patient's report): No Awareness, Dental Status Current problems with teeth and/or dentures?: No Does patient usually wear dentures?: No  CIWA:  CIWA-Ar Total: 4 COWS:  COWS Total Score: 3  Musculoskeletal: Strength & Muscle Tone: within normal limits Gait & Station: normal Patient leans: N/A  Psychiatric Specialty Exam: Physical Exam  Nursing note and vitals reviewed. Constitutional: He is oriented to person, place, and time. He appears well-developed and well-nourished.  Cardiovascular: Normal rate.  Respiratory: Effort normal.  Musculoskeletal:        General: Normal range of motion.  Neurological: He is alert and oriented to person, place, and time.  Skin: Skin is warm.    Review of Systems  Constitutional: Negative.   HENT: Negative.   Eyes: Negative.   Respiratory: Negative.   Cardiovascular: Negative.   Gastrointestinal: Negative.   Genitourinary: Negative.   Musculoskeletal: Negative.   Skin: Negative.   Neurological: Negative.   Psychiatric/Behavioral: Negative.     Blood pressure (!) 138/98, pulse 83, temperature 97.8 F (36.6 C), temperature source  Oral, resp. rate 20, height 6\' 1"  (1.854 m), weight 95.2 kg, SpO2 100 %.Body mass index is 27.7 kg/m.   General Appearance: Casual  Eye Contact::  Good  Speech:  Normal Rate409  Volume:  Normal  Mood:  Euthymic  Affect:  Congruent  Thought Process:  Coherent and Descriptions of Associations: Intact  Orientation:  Full (Time, Place, and Person)  Thought Content:  Logical  Suicidal Thoughts:  No  Homicidal Thoughts:  No  Memory:  Immediate;   Good Recent;   Good Remote;   Good  Judgement:  Intact  Insight:  Fair  Psychomotor Activity:  Normal  Concentration:  Good  Recall:  Good  Fund of Knowledge:Good  Language: Good  Akathisia:  Negative  Handed:  Right  AIMS (if indicated):     Assets:  Communication Skills Desire for Improvement Housing Resilience  Sleep:  Number of Hours: 6.5  Cognition: WNL  ADL's:  Intact   Have you used any form of tobacco in the last 30 days? (Cigarettes, Smokeless Tobacco, Cigars, and/or Pipes): No("I don't smoke")  Has this patient used any form of tobacco in the last 30 days? (Cigarettes, Smokeless Tobacco, Cigars, and/or Pipes) Yes, No  Blood Alcohol level:  Lab Results  Component Value Date   ETH 171 (H) 04/17/2020   ETH 231 (H) 20/94/7096    Metabolic Disorder Labs:  No results found for: HGBA1C, MPG No results found for: PROLACTIN No results found for: CHOL, TRIG, HDL, CHOLHDL, VLDL, LDLCALC  See Psychiatric Specialty Exam and Suicide Risk Assessment completed by Attending Physician prior to discharge.  Discharge destination:  Home  Is patient on multiple antipsychotic therapies at discharge:  No   Has Patient had three or more failed trials of antipsychotic monotherapy by history:  No  Recommended Plan for Multiple Antipsychotic Therapies: NA   Allergies as of 04/23/2020   No Known Allergies     Medication List    STOP taking these medications   folic acid 1 MG tablet Commonly known as: FOLVITE   thiamine 100 MG  tablet     TAKE these medications     Indication  amLODipine 5 MG tablet Commonly known as: NORVASC Take 1 tablet (5 mg total) by mouth daily. Start taking on: Apr 24, 2020  Indication: High Blood Pressure Disorder   busPIRone 5 MG tablet Commonly known as: BUSPAR Take 1 tablet (5 mg total) by mouth 3 (three) times daily.  Indication: Anxiety Disorder   mirtazapine 15 MG tablet Commonly known as: REMERON Take 1 tablet (15 mg total) by mouth at bedtime.  Indication: Major Depressive Disorder   venlafaxine XR 150 MG 24 hr capsule Commonly known as: EFFEXOR-XR Take 1 capsule (150 mg total) by mouth daily with breakfast. Start taking on: Apr 24, 2020 What changed: when to take this  Indication: Major Depressive Disorder      Follow-up [facility] Follow up on 05/06/2020.   Why: You are scheduled for an appointment on 05/06/20 at 11:30 am with Patrcia Dolly, NP.  This will be a Virtual appointment.   Contact information: [place]  [address] Tel:  [phone] Fax: [place], [place]. Go on 04/30/2020.   Specialty: Behavioral Health Why: You have an appointment on 04/30/20 at 10:00 am for clinical assessment for therapy.  This appointment will be held in person.   Contact information: [address] [place] [address] [phone]           Follow-up recommendations:  Continue activity as tolerated. Continue diet as recommended by your PCP. Ensure to keep all appointments with outpatient providers.  Comments:  Patient is instructed prior to discharge to: Take all medications as prescribed by his/her mental healthcare provider. Report any adverse effects and or reactions from the medicines to his/her outpatient provider promptly. Patient has been instructed & cautioned: To not engage in alcohol and or illegal drug use while on prescription medicines. In the event of worsening symptoms, patient is  instructed to call the crisis hotline, 911 and or go to the nearest ED for appropriate evaluation and treatment of symptoms. To follow-up with his/her primary care provider for your other medical issues, concerns and or health care needs.    Signed: [place], FNP 04/23/2020, 11:15 AM

## 2020-04-23 NOTE — Plan of Care (Signed)
Patient stayed in the dayroom with peers until bedtime. Attended group, had a snack and received bedtime medications. Currently in bed resting. No sign of distress. Safety monitored as expected.

## 2020-04-23 NOTE — [facility] Suicide Risk Assessment (Signed)
[facility] INPATIENT:  Family/Significant Other Suicide Prevention Education  Suicide Prevention Education:  Education Completed; with mother, Brodan Grewell [phone]) has been identified by the patient as the family member/significant other with whom the patient will be residing, and identified as the person(s) who will aid the patient in the event of a mental health crisis (suicidal ideations/suicide attempt).  With written consent from the patient, the family member/significant other has been provided the following suicide prevention education, prior to the and/or following the discharge of the patient.  The suicide prevention education provided includes the following:  Suicide risk factors  Suicide prevention and interventions  National Suicide Hotline telephone number  [facility] assessment telephone number  [facility] Emergency Assistance 911  [facility] and/or Residential Mobile Crisis Unit telephone number  Request made of family/significant other to:  Remove weapons (e.g., guns, rifles, knives), all items previously/currently identified as safety concern.    Remove drugs/medications (over-the-counter, prescriptions, illicit drugs), all items previously/currently identified as a safety concern.  The family member/significant other verbalizes understanding of the suicide prevention education information provided.  The family member/significant other agrees to remove the items of safety concern listed above.   Patient's mother reports she does not have any issues with the patient discharging today. CSW shared the patient's discharge plans with his mother. She expressed understanding and gratitude. Patient's mother reports she does not have any additional questions or concerns.    Maeola Sarah 04/23/2020, 11:01 AM

## 2020-04-23 NOTE — Progress Notes (Signed)
Pt out of bed and currently in the dayroom. Reports that he did not sleep well and reports feeling anxious. He appears tired and restless. BP 149/106. Oncoming nurse notified for follow up.

## 2020-04-23 NOTE — Progress Notes (Signed)
  [facility] Adult Case Management Discharge Plan :  Will you be returning to the same living situation after discharge:  Yes,  patient is returning home with his girlfriend and child At discharge, do you have transportation home?: Yes,  patient's mother is picking the patient up  Do you have the ability to pay for your medications: Yes,  BCBS  Release of information consent forms completed and in the chart;  Patient's signature needed at discharge.  Patient to Follow up at: Follow-up Information    [facility] Follow up on 05/06/2020.   Why: You are scheduled for an appointment on 05/06/20 at 11:30 am with Virgia Land, NP.  This will be a Virtual appointment.   Contact information: [address] [place]  [address] Tel:  [phone] Fax: [phone]       [facility]. Go on 04/30/2020.   Specialty: Behavioral Health Why: You have an appointment on 04/30/20 at 10:00 am for clinical assessment for therapy.  This appointment will be held in person.   Contact information: [address] [place] [address] [phone]           Next level of care provider has access to [facility] Link:yes  Safety Planning and Suicide Prevention discussed: Yes,  with the patient's mother  Have you used any form of tobacco in the last 30 days? (Cigarettes, Smokeless Tobacco, Cigars, and/or Pipes): No("I don't smoke")  Has patient been referred to the Quitline?: Patient refused referral  Patient has been referred for addiction treatment: Yes  Maeola Sarah, LCSWA 04/23/2020, 4:05 PM

## 2020-04-23 NOTE — [facility] Suicide Risk Assessment (Signed)
[facility] INPATIENT:  Family/Significant Other Suicide Prevention Education  Suicide Prevention Education:  Contact Attempts: with patient's girlfriend, Jacklynn Bue [phone]) has been identified by the patient as the family member/significant other with whom the patient will be residing, and identified as the person(s) who will aid the patient in the event of a mental health crisis.  With written consent from the patient, two attempts were made to provide suicide prevention education, prior to and/or following the patient's discharge.  We were unsuccessful in providing suicide prevention education.  A suicide education pamphlet was given to the patient to share with family/significant other.  Date and time of first attempt:04/18/2020 / 10:58am - Unable to leave HIPAA compliant voicemail.   Date and time of second attempt: Needs to be done   [name] 04/23/2020, 10:58 AM

## 2020-04-23 NOTE — [facility] Suicide Risk Assessment (Signed)
[facility] Discharge Suicide Risk Assessment   Principal Problem: MDD (major depressive disorder), recurrent episode, severe (HCC) Discharge Diagnoses: Principal Problem:   MDD (major depressive disorder), recurrent episode, severe (HCC)   Total Time spent with patient: 15 minutes  Musculoskeletal: Strength & Muscle Tone: within normal limits Gait & Station: normal Patient leans: N/A  Psychiatric Specialty Exam: Review of Systems  All other systems reviewed and are negative.   Blood pressure (!) 138/98, pulse 83, temperature 97.8 F (36.6 C), temperature source Oral, resp. rate 20, height 6\' 1"  (1.854 m), weight 95.2 kg, SpO2 100 %.Body mass index is 27.7 kg/m.  General Appearance: Casual  Eye Contact::  Good  Speech:  Normal Rate409  Volume:  Normal  Mood:  Euthymic  Affect:  Congruent  Thought Process:  Coherent and Descriptions of Associations: Intact  Orientation:  Full (Time, Place, and Person)  Thought Content:  Logical  Suicidal Thoughts:  No  Homicidal Thoughts:  No  Memory:  Immediate;   Good Recent;   Good Remote;   Good  Judgement:  Intact  Insight:  Fair  Psychomotor Activity:  Normal  Concentration:  Good  Recall:  Good  Fund of Knowledge:Good  Language: Good  Akathisia:  Negative  Handed:  Right  AIMS (if indicated):     Assets:  Communication Skills Desire for Improvement Housing Resilience  Sleep:  Number of Hours: 6.5  Cognition: WNL  ADL's:  Intact   Mental Status Per Nursing Assessment::   On Admission:  NA  Demographic Factors:  Male, Divorced or widowed and Caucasian  Loss Factors: NA  Historical Factors: Impulsivity  Risk Reduction Factors:   Sense of responsibility to family and Positive social support  Continued Clinical Symptoms:  Depression:   Comorbid alcohol abuse/dependence Insomnia Alcohol/Substance Abuse/Dependencies  Cognitive Features That Contribute To Risk:  None    Suicide Risk:  Minimal: No identifiable  suicidal ideation.  Patients presenting with no risk factors but with morbid ruminations; may be classified as minimal risk based on the severity of the depressive symptoms  Follow-up Information    [facility] Follow up on 05/06/2020.   Why: You are scheduled for an appointment on 05/06/20 at 11:30 am with 07/06/20, NP.  This will be a Virtual appointment.   Contact information: [address] [place]  [place] Tel:  [phone] Fax: [phone]       [facility]. Go on 04/30/2020.   Specialty: Behavioral Health Why: You have an appointment on 04/30/20 at 10:00 am for clinical assessment for therapy.  This appointment will be held in person.   Contact information: [address] [place] [phone]           Plan Of Care/Follow-up recommendations:  Activity:  ad lib  [phone], MD 04/23/2020, 11:31 AM

## 2020-04-24 NOTE — Progress Notes (Signed)
ADULT GRIEF GROUP NOTE:  Spiritual care group on grief and loss facilitated by chaplain Burnis Kingfisher MDiv, B[facility]  Group Goal:  Support / Education around grief and loss Members engage in facilitated group support and psycho-social education.  Group Description:  Following introductions and group rules, group members engaged in facilitated group dialog and support around topic of loss, with particular support around experiences of loss in their lives. Group Identified types of loss (relationships / self / things) and identified patterns, circumstances, and changes that precipitate losses. Reflected on thoughts / feelings around loss, normalized grief responses, and recognized variety in grief experience.   Group noted Worden's four tasks of grief in discussion.  Group drew on Adlerian / Rogerian, narrative, MI, Patient Progress:  Present through 50% of group.  Attentive to group discussion.  Did not engage.  Left group and did not return.

## 2020-12-21 ENCOUNTER — Emergency Department (HOSPITAL_COMMUNITY)
Admission: EM | Admit: 2020-12-21 | Discharge: 2020-12-21 | Disposition: A | Discharge status: Home / Self Care | Payer: BC Managed Care – PPO | Attending: Emergency Medicine | Admitting: Emergency Medicine

## 2020-12-21 ENCOUNTER — Other Ambulatory Visit: Payer: Self-pay

## 2020-12-21 ENCOUNTER — Encounter (HOSPITAL_COMMUNITY): Payer: Self-pay | Admitting: Obstetrics and Gynecology

## 2020-12-21 DIAGNOSIS — F10239 Alcohol dependence with withdrawal, unspecified: Secondary | ICD-10-CM | POA: Insufficient documentation

## 2020-12-21 DIAGNOSIS — F10230 Alcohol dependence with withdrawal, uncomplicated: Secondary | ICD-10-CM

## 2020-12-21 DIAGNOSIS — F17290 Nicotine dependence, other tobacco product, uncomplicated: Secondary | ICD-10-CM | POA: Insufficient documentation

## 2020-12-21 DIAGNOSIS — Z8616 Personal history of COVID-19: Secondary | ICD-10-CM | POA: Insufficient documentation

## 2020-12-21 DIAGNOSIS — F10930 Alcohol use, unspecified with withdrawal, uncomplicated: Secondary | ICD-10-CM

## 2020-12-21 DIAGNOSIS — Y900 Blood alcohol level of less than 20 mg/100 ml: Secondary | ICD-10-CM | POA: Insufficient documentation

## 2020-12-21 LAB — COMPREHENSIVE METABOLIC PANEL
ALT: 23 U/L (ref 0–44)
AST: 26 U/L (ref 15–41)
Albumin: 5.1 g/dL — ABNORMAL HIGH (ref 3.5–5.0)
Alkaline Phosphatase: 54 U/L (ref 38–126)
Anion gap: 10 (ref 5–15)
BUN: 25 mg/dL — ABNORMAL HIGH (ref 6–20)
CO2: 26 mmol/L (ref 22–32)
Calcium: 9.7 mg/dL (ref 8.9–10.3)
Chloride: 101 mmol/L (ref 98–111)
Creatinine, Ser: 1.03 mg/dL (ref 0.61–1.24)
GFR, Estimated: >60 mL/min (ref >60)
Glucose, Bld: 92 mg/dL (ref 70–99)
Potassium: 4 mmol/L (ref 3.5–5.1)
Sodium: 137 mmol/L (ref 135–145)
Total Bilirubin: 1.2 mg/dL (ref 0.3–1.2)
Total Protein: 8.6 g/dL — ABNORMAL HIGH (ref 6.5–8.1)

## 2020-12-21 LAB — CBC
HCT: 43.6 % (ref 39.0–52.0)
Hemoglobin: 14.3 g/dL (ref 13.0–17.0)
MCH: 29.5 pg (ref 26.0–34.0)
MCHC: 32.8 g/dL (ref 30.0–36.0)
MCV: 90.1 fL (ref 80.0–100.0)
Platelets: 165 10*3/uL (ref 150–400)
RBC: 4.84 MIL/uL (ref 4.22–5.81)
RDW: 12.1 % (ref 11.5–15.5)
WBC: 7.5 10*3/uL (ref 4.0–10.5)
nRBC: 0 % (ref 0.0–0.2)

## 2020-12-21 LAB — ETHANOL: Alcohol, Ethyl (B): <10 mg/dL (ref <10)

## 2020-12-21 MED ORDER — CHLORDIAZEPOXIDE HCL 25 MG PO CAPS
ORAL_CAPSULE | ORAL | 0 refills | Status: DC
Start: 2020-12-21 — End: 2020-12-22

## 2020-12-21 MED ORDER — LORAZEPAM 1 MG PO TABS
2.0000 mg | ORAL_TABLET | Freq: Once | ORAL | Status: AC
  Administered 2020-12-21: 2 mg via ORAL
  Filled 2020-12-21: qty 2

## 2020-12-21 NOTE — ED Triage Notes (Signed)
Patient reports to the ER for Withdrawal symptoms. Patient reports he was on his couch and woke up on the floor and does not remember what happened.  Patient reports he tried to stop drinking cold [place]. Patient reports last drink at 11pm last night.

## 2020-12-21 NOTE — Discharge Instructions (Addendum)
Take the medications as prescribed.  Follow-up with an outpatient treatment center

## 2020-12-21 NOTE — ED Provider Notes (Signed)
[facility]-EMERGENCY DEPT Provider Note   CSN: [id number] Arrival date & time: 12/21/20  1659     History Chief Complaint  Patient presents with  . Withdrawal    [name] is a 30 y.o. male.  HPI   Patient presents to the ED for evaluation of possible alcohol withdrawal.  Patient states he is a heavy drinker.  He drinks at least six beers daily.  Patient decided to stop drinking last evening.  He was on his couch today and the next thing he knows he woke up on the floor.  He is not sure what happened.  Patient has had trouble with alcohol withdrawal and questionable seizures in the past we was not sure if he had one.  Patient is feeling shaky.  He denies any nausea vomiting.  No fevers or chills.  Patient does want to quit drinking  Past Medical History:  Diagnosis Date  . Alcohol dependence (HCC)   . COVID-19   . Suicidal behavior with attempted self-injury [facility])     Patient Active Problem List   Diagnosis Date Noted  . MDD (major depressive disorder), recurrent episode, severe (HCC) 04/18/2020  . 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 No pertinent surgical history.     No family history on file.  Social History   Tobacco Use  . Smoking status: Current Every Day Smoker    Types: E-cigarettes  . Smokeless tobacco: Never Used  Substance Use Topics  . Alcohol use: Yes    Alcohol/week: 70.0 standard drinks    Types: 70 Standard drinks or equivalent per week  . Drug use: Yes    Types: Benzodiazepines    Home Medications Prior to Admission medications   Medication Sig Start Date End Date Taking? Authorizing Provider  chlordiazePOXIDE (LIBRIUM) 25 MG capsule 50mg  PO TID x 1D, then 25-50mg  PO BID X 1D, then 25-50mg  PO QD X 1D 12/21/20  Yes 12/23/20, MD  amLODipine (NORVASC) 5 MG tablet Take 1 tablet (5 mg total) by mouth daily. 04/24/20   Money, 04/26/20, FNP  busPIRone (BUSPAR) 5 MG tablet  Take 1 tablet (5 mg total) by mouth 3 (three) times daily. 04/23/20   Money, 04/25/20, FNP  mirtazapine (REMERON) 15 MG tablet Take 1 tablet (15 mg total) by mouth at bedtime. 04/23/20   Money, 04/25/20, FNP  venlafaxine XR (EFFEXOR-XR) 150 MG 24 hr capsule Take 1 capsule (150 mg total) by mouth daily with breakfast. 04/24/20   Money, 04/26/20, FNP    Allergies    Patient has no known allergies.  Review of Systems   Review of Systems  All other systems reviewed and are negative.   Physical Exam Updated Vital Signs BP (!) 150/100 (BP Location: Left Arm)   Pulse 90   Temp 98.2 F (36.8 C) (Oral)   Resp (!) 26   Ht 1.854 m (6\' 1" )   Wt 86.2 kg   SpO2 100%   BMI 25.07 kg/m   Physical Exam Vitals and nursing note reviewed.  Constitutional:      General: He is not in acute distress.    Appearance: He is well-developed and well-nourished.  HENT:     Head: Normocephalic and atraumatic.     Right Ear: External ear normal.     Left Ear: External ear normal.  Eyes:     General: No scleral icterus.       Right eye: No discharge.  Left eye: No discharge.     Conjunctiva/sclera: Conjunctivae normal.  Neck:     Trachea: No tracheal deviation.  Cardiovascular:     Rate and Rhythm: Normal rate and regular rhythm.     Pulses: Intact distal pulses.  Pulmonary:     Effort: Pulmonary effort is normal. No respiratory distress.     Breath sounds: Normal breath sounds. No stridor. No wheezing or rales.  Abdominal:     General: Bowel sounds are normal. There is no distension.     Palpations: Abdomen is soft.     Tenderness: There is no abdominal tenderness. There is no guarding or rebound.  Musculoskeletal:        General: No tenderness or edema.     Cervical back: Neck supple.  Skin:    General: Skin is warm and dry.     Findings: No rash.  Neurological:     Mental Status: He is alert.     Cranial Nerves: No cranial nerve deficit (no facial droop, extraocular movements intact,  no slurred speech).     Sensory: No sensory deficit.     Motor: No abnormal muscle tone or seizure activity.     Coordination: Coordination normal.     Deep Tendon Reflexes: Strength normal.     Comments: Mild tremor on exam  Psychiatric:        Mood and Affect: Mood and affect normal.     ED Results / Procedures / Treatments   Labs (all labs ordered are listed, but only abnormal results are displayed) Labs Reviewed  COMPREHENSIVE METABOLIC PANEL - Abnormal; Notable for the following components:      Result Value   BUN 25 (*)    Total Protein 8.6 (*)    Albumin 5.1 (*)    All other components within normal limits  ETHANOL  CBC  RAPID URINE DRUG SCREEN, HOSP PERFORMED    EKG None  Radiology No results found.  Procedures Procedures (including critical care time)  Medications Ordered in ED Medications  LORazepam (ATIVAN) tablet 2 mg (has no administration in time range)    ED Course  I have reviewed the triage vital signs and the nursing notes.  Pertinent labs & imaging results that were available during my care of the patient were reviewed by me and considered in my medical decision making (see chart for details).  Clinical Course as of 12/21/20 2018  Wynelle Link Dec 21, 2020  2015 Labs reviewed.  CBC metabolic panel normal.  Alcohol negative [JK]    Clinical Course User Index [JK] Linwood Dibbles, MD   MDM Rules/Calculators/A&P                          Patient presented to the ED with concerns of possible alcohol withdrawal.  Patient also with question of possible seizure.  In the ED the patient is alert and oriented.  No confusion.  No signs of DTs.  Patient is not tachycardic although is mildly hypertensive.  Suspect he is having a component of alcohol withdrawal.  I will give the patient a dose of Ativan here in the ED and discharge him home on a course of Librium.  Patient resources provided. Final Clinical Impression(s) / ED Diagnoses Final diagnoses:  Alcohol  withdrawal syndrome without complication (HCC)    Rx / DC Orders ED Discharge Orders         Ordered    chlordiazePOXIDE (LIBRIUM) 25 MG capsule  12/21/20 2017           Linwood Dibbles, MD 12/21/20 2018

## 2020-12-22 ENCOUNTER — Telehealth (HOSPITAL_COMMUNITY): Payer: Self-pay | Admitting: Emergency Medicine

## 2020-12-22 MED ORDER — CHLORDIAZEPOXIDE HCL 25 MG PO CAPS: ORAL_CAPSULE | ORAL | 0 refills | Status: DC

## 2020-12-22 NOTE — Telephone Encounter (Signed)
Pharmacy not open, requesting sent to another one.

## 2020-12-22 NOTE — ED Notes (Signed)
Pt called due to pharmacy closed. Dr Adela Lank was able to send to [facility] [address] . Pt aware

## 2021-03-01 ENCOUNTER — Other Ambulatory Visit: Payer: Self-pay

## 2021-03-01 ENCOUNTER — Emergency Department (HOSPITAL_COMMUNITY)
Admission: EM | Admit: 2021-03-01 | Discharge: 2021-03-01 | Disposition: A | Discharge status: Home / Self Care | Payer: Self-pay | Attending: Emergency Medicine | Admitting: Emergency Medicine

## 2021-03-01 DIAGNOSIS — F1490 Cocaine use, unspecified, uncomplicated: Secondary | ICD-10-CM | POA: Insufficient documentation

## 2021-03-01 DIAGNOSIS — F1910 Other psychoactive substance abuse, uncomplicated: Secondary | ICD-10-CM

## 2021-03-01 DIAGNOSIS — F17290 Nicotine dependence, other tobacco product, uncomplicated: Secondary | ICD-10-CM | POA: Insufficient documentation

## 2021-03-01 DIAGNOSIS — F1510 Other stimulant abuse, uncomplicated: Secondary | ICD-10-CM | POA: Insufficient documentation

## 2021-03-01 DIAGNOSIS — F1110 Opioid abuse, uncomplicated: Secondary | ICD-10-CM | POA: Insufficient documentation

## 2021-03-01 DIAGNOSIS — F10139 Alcohol abuse with withdrawal, unspecified: Secondary | ICD-10-CM | POA: Insufficient documentation

## 2021-03-01 DIAGNOSIS — F419 Anxiety disorder, unspecified: Secondary | ICD-10-CM | POA: Insufficient documentation

## 2021-03-01 DIAGNOSIS — Z8616 Personal history of COVID-19: Secondary | ICD-10-CM | POA: Insufficient documentation

## 2021-03-01 LAB — RAPID URINE DRUG SCREEN, HOSP PERFORMED
Amphetamines: NOT DETECTED
Barbiturates: NOT DETECTED
Benzodiazepines: POSITIVE — AB
Cocaine: POSITIVE — AB
Opiates: NOT DETECTED
Tetrahydrocannabinol: NOT DETECTED

## 2021-03-01 LAB — COMPREHENSIVE METABOLIC PANEL
ALT: 17 U/L (ref 0–44)
AST: 22 U/L (ref 15–41)
Albumin: 4.8 g/dL (ref 3.5–5.0)
Alkaline Phosphatase: 60 U/L (ref 38–126)
Anion gap: 11 (ref 5–15)
BUN: 18 mg/dL (ref 6–20)
CO2: 22 mmol/L (ref 22–32)
Calcium: 9.2 mg/dL (ref 8.9–10.3)
Chloride: 107 mmol/L (ref 98–111)
Creatinine, Ser: 0.86 mg/dL (ref 0.61–1.24)
GFR, Estimated: >60 mL/min (ref >60)
Glucose, Bld: 95 mg/dL (ref 70–99)
Potassium: 4.1 mmol/L (ref 3.5–5.1)
Sodium: 140 mmol/L (ref 135–145)
Total Bilirubin: 0.7 mg/dL (ref 0.3–1.2)
Total Protein: 8.3 g/dL — ABNORMAL HIGH (ref 6.5–8.1)

## 2021-03-01 LAB — CBC WITH DIFFERENTIAL/PLATELET
Abs Immature Granulocytes: 0.03 10*3/uL (ref 0.00–0.07)
Basophils Absolute: 0 10*3/uL (ref 0.0–0.1)
Basophils Relative: 1 %
Eosinophils Absolute: 0 10*3/uL (ref 0.0–0.5)
Eosinophils Relative: 0 %
HCT: 42.7 % (ref 39.0–52.0)
Hemoglobin: 14.7 g/dL (ref 13.0–17.0)
Immature Granulocytes: 0 %
Lymphocytes Relative: 26 %
Lymphs Abs: 1.8 10*3/uL (ref 0.7–4.0)
MCH: 29.6 pg (ref 26.0–34.0)
MCHC: 34.4 g/dL (ref 30.0–36.0)
MCV: 86.1 fL (ref 80.0–100.0)
Monocytes Absolute: 0.6 10*3/uL (ref 0.1–1.0)
Monocytes Relative: 8 %
Neutro Abs: 4.4 10*3/uL (ref 1.7–7.7)
Neutrophils Relative %: 65 %
Platelets: 204 10*3/uL (ref 150–400)
RBC: 4.96 MIL/uL (ref 4.22–5.81)
RDW: 12.3 % (ref 11.5–15.5)
WBC: 6.8 10*3/uL (ref 4.0–10.5)
nRBC: 0 % (ref 0.0–0.2)

## 2021-03-01 LAB — SALICYLATE LEVEL: Salicylate Lvl: <7 mg/dL — ABNORMAL LOW (ref 7.0–30.0)

## 2021-03-01 LAB — ETHANOL: Alcohol, Ethyl (B): 173 mg/dL — ABNORMAL HIGH (ref <10)

## 2021-03-01 LAB — ACETAMINOPHEN LEVEL: Acetaminophen (Tylenol), Serum: <10 ug/mL — ABNORMAL LOW (ref 10–30)

## 2021-03-01 MED ORDER — LORAZEPAM 2 MG/ML IJ SOLN
0.0000 mg | Freq: Four times a day (QID) | INTRAMUSCULAR | Status: DC
  Administered 2021-03-01: 1 mg via INTRAVENOUS
  Filled 2021-03-01: qty 1

## 2021-03-01 MED ORDER — THIAMINE HCL 100 MG/ML IJ SOLN: 100.0000 mg | Freq: Every day | INTRAMUSCULAR | Status: DC

## 2021-03-01 MED ORDER — LORAZEPAM 1 MG PO TABS: 0.0000 mg | ORAL_TABLET | Freq: Two times a day (BID) | ORAL | Status: DC

## 2021-03-01 MED ORDER — LORAZEPAM 1 MG PO TABS: 0.0000 mg | ORAL_TABLET | Freq: Four times a day (QID) | ORAL | Status: DC

## 2021-03-01 MED ORDER — THIAMINE HCL 100 MG PO TABS
100.0000 mg | ORAL_TABLET | Freq: Every day | ORAL | Status: DC
  Administered 2021-03-01: 100 mg via ORAL
  Filled 2021-03-01: qty 1

## 2021-03-01 MED ORDER — CHLORDIAZEPOXIDE HCL 25 MG PO CAPS: ORAL_CAPSULE | ORAL | 0 refills | Status: DC

## 2021-03-01 MED ORDER — LORAZEPAM 2 MG/ML IJ SOLN: 0.0000 mg | Freq: Two times a day (BID) | INTRAMUSCULAR | Status: DC

## 2021-03-01 NOTE — Discharge Instructions (Addendum)
Stop drinking alcohol.  Follow-up with the outpatient treatment that you are planning to go to.

## 2021-03-01 NOTE — ED Provider Notes (Signed)
[facility]-EMERGENC[facility] DEPT Provider Note   CSN: [id number] Arrival date & time: 03/01/21  0442     History Chief Complaint  Patient presents with  . Withdrawal    [name] is a 30 y.o. male.  The history is provided by the patient.  Mental Health Problem Degree of incapacity (severity):  Moderate Onset quality:  Sudden Timing:  Constant Progression:  Worsening Chronicity:  New Context: alcohol use and drug abuse   Relieved by:  Nothing Worsened by:  Nothing Associated symptoms: anxiety   Associated symptoms: no abdominal pain, no chest pain and no headaches   Patient presented because he feels like he is going to go through withdrawal.  Patient has a long history of polysubstance abuse.  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 Last alcoholic drink was approximately 2 hours prior to arrival.  Its been over 24 hours since he use opiates.  He reports it has been several weeks since last amphetamine abuse.  He reports feeling very anxious.  He has required benzodiazepines in the past for his withdrawal syndrome He denies any pain complaints    Past Medical History:  Diagnosis Date  . Alcohol dependence (HCC)   . COVID-19   . Suicidal behavior with attempted self-injury [facility])     Patient Active Problem List   Diagnosis Date Noted  . MDD (major depressive disorder), recurrent episode, severe (HCC) 04/18/2020  . 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     No family history on file.  Social History   Tobacco Use  . Smoking status: Current Every Day Smoker    Types: E-cigarettes  . Smokeless tobacco: Never Used  Substance Use Topics  . Alcohol use: Yes    Alcohol/week: 70.0 standard drinks    Types: 70 Standard drinks or equivalent per week  . Drug use: Yes    Types: Benzodiazepines    Home Medications Prior to Admission medications    Medication Sig Start Date End Date Taking? Authorizing Provider  mirtazapine (REMERON) 15 MG tablet Take 1 tablet (15 mg total) by mouth at bedtime. 04/23/20  Yes Money, Gerlene Burdock, FNP  venlafaxine XR (EFFEXOR-XR) 150 MG 24 hr capsule Take 1 capsule (150 mg total) by mouth daily with breakfast. 04/24/20  Yes Money, Gerlene Burdock, FNP  amLODipine (NORVASC) 5 MG tablet Take 1 tablet (5 mg total) by mouth daily. Patient not taking: No sig reported 04/24/20   Money, Gerlene Burdock, FNP  busPIRone (BUSPAR) 5 MG tablet Take 1 tablet (5 mg total) by mouth 3 (three) times daily. Patient not taking: No sig reported 04/23/20   Money, Gerlene Burdock, FNP  chlordiazePOXIDE (LIBRIUM) 25 MG capsule 50mg  PO TID x 1D, then 25-50mg  PO BID X 1D, then 25-50mg  PO QD X 1D Patient not taking: No sig reported 12/22/20   12/24/20, DO    Allergies    Patient has no known allergies.  Review of Systems   Review of Systems  Constitutional: Negative for fever.  Respiratory: Negative for cough and shortness of breath.   Cardiovascular: Negative for chest pain.  Gastrointestinal: Negative for abdominal pain.  Neurological: Negative for headaches.  Psychiatric/Behavioral: The patient is nervous/anxious.   All other systems reviewed and are negative.   Physical Exam Updated Vital Signs BP (!) 149/99 (BP Location: Right Arm)   Pulse (!) 120 Comment: Triage RN aware  Temp 99 F (37.2 C) (  Oral)   Resp 18   SpO2 99%   Physical Exam CONSTITUTIONAL: Disheveled and anxious HEAD: Normocephalic/atraumatic EYES: EOMI/PERRL, pupils dilated bilaterally, no nystagmus ENMT: Mucous membranes moist NECK: supple no meningeal signs SPINE/BACK:entire spine nontender CV: S1/S2 noted, tachycardic LUNGS: Lungs are clear to auscultation bilaterally, no apparent distress ABDOMEN: soft, nontender, no rebound or guarding, bowel sounds noted throughout abdomen GU:no cva tenderness NEURO: Pt is awake/alert/appropriate, moves all extremitiesx4.  No  facial droop.  Mild tremor noted to his hands EXTREMITIES: pulses normal/equal, full ROM SKIN: warm, color normal PSYCH: Anxious  ED Results / Procedures / Treatments   Labs (all labs ordered are listed, but only abnormal results are displayed) Labs Reviewed  COMPREHENSIVE METABOLIC PANEL - Abnormal; Notable for the following components:      Result Value   Total Protein 8.3 (*)    All other components within normal limits  ETHANOL - Abnormal; Notable for the following components:   Alcohol, Ethyl (B) 173 (*)    All other components within normal limits  RAPID URINE DRUG SCREEN, HOSP PERFORMED - Abnormal; Notable for the following components:   Cocaine POSITIVE (*)    Benzodiazepines POSITIVE (*)    All other components within normal limits  ACETAMINOPHEN LEVEL - Abnormal; Notable for the following components:   Acetaminophen (Tylenol), Serum <10 (*)    All other components within normal limits  SALICYLATE LEVEL - Abnormal; Notable for the following components:   Salicylate Lvl <7.0 (*)    All other components within normal limits  CBC WITH DIFFERENTIAL/PLATELET    EKG None  Radiology No results found.  Procedures Procedures   Medications Ordered in ED Medications  LORazepam (ATIVAN) injection 0-4 mg (1 mg Intravenous Given 03/01/21 0641)    Or  LORazepam (ATIVAN) tablet 0-4 mg ( Oral See Alternative 03/01/21 0641)  LORazepam (ATIVAN) injection 0-4 mg (has no administration in time range)    Or  LORazepam (ATIVAN) tablet 0-4 mg (has no administration in time range)  thiamine tablet 100 mg (has no administration in time range)    Or  thiamine (B-1) injection 100 mg (has no administration in time range)    ED Course  I have reviewed the triage vital signs and the nursing notes.  Pertinent labs & imaging results that were available during my care of the patient were reviewed by me and considered in my medical decision making (see chart for details).    MDM  Rules/Calculators/A&P                         7:35 AM Patient with mild alcohol withdrawal.  Plan to monitor in the ED, ensure CIWA score is improving, and likely discharge home. Signed out to dr zammit at shift change Final Clinical Impression(s) / ED Diagnoses Final diagnoses:  None    Rx / DC Orders ED Discharge Orders    None       Zadie Rhine, MD 03/01/21 [phone]

## 2021-03-01 NOTE — ED Triage Notes (Signed)
Pt reports that he feels like he is going to go in to withdrawal "very soon." States that he would be withdrawing from alcohol and opiates. Last drank beer 2 hours ago. Last used opiates about 24 hours go. A&Ox4. Ambulatory.

## 2021-03-12 ENCOUNTER — Encounter (HOSPITAL_COMMUNITY): Payer: Self-pay

## 2021-03-12 ENCOUNTER — Other Ambulatory Visit: Payer: Self-pay

## 2021-03-12 ENCOUNTER — Emergency Department (HOSPITAL_COMMUNITY)
Admission: EM | Admit: 2021-03-12 | Discharge: 2021-03-12 | Disposition: A | Discharge status: Home / Self Care | Payer: Self-pay | Attending: Emergency Medicine | Admitting: Emergency Medicine

## 2021-03-12 DIAGNOSIS — F10239 Alcohol dependence with withdrawal, unspecified: Secondary | ICD-10-CM | POA: Insufficient documentation

## 2021-03-12 DIAGNOSIS — F17290 Nicotine dependence, other tobacco product, uncomplicated: Secondary | ICD-10-CM | POA: Insufficient documentation

## 2021-03-12 DIAGNOSIS — R000 Tachycardia, unspecified: Secondary | ICD-10-CM | POA: Insufficient documentation

## 2021-03-12 DIAGNOSIS — F10230 Alcohol dependence with withdrawal, uncomplicated: Secondary | ICD-10-CM

## 2021-03-12 DIAGNOSIS — Z8616 Personal history of COVID-19: Secondary | ICD-10-CM | POA: Insufficient documentation

## 2021-03-12 DIAGNOSIS — Y900 Blood alcohol level of less than 20 mg/100 ml: Secondary | ICD-10-CM | POA: Insufficient documentation

## 2021-03-12 DIAGNOSIS — R251 Tremor, unspecified: Secondary | ICD-10-CM | POA: Insufficient documentation

## 2021-03-12 DIAGNOSIS — F10930 Alcohol use, unspecified with withdrawal, uncomplicated: Secondary | ICD-10-CM

## 2021-03-12 LAB — COMPREHENSIVE METABOLIC PANEL
ALT: 18 U/L (ref 0–44)
AST: 20 U/L (ref 15–41)
Albumin: 4.6 g/dL (ref 3.5–5.0)
Alkaline Phosphatase: 52 U/L (ref 38–126)
Anion gap: 8 (ref 5–15)
BUN: 17 mg/dL (ref 6–20)
CO2: 27 mmol/L (ref 22–32)
Calcium: 9.7 mg/dL (ref 8.9–10.3)
Chloride: 102 mmol/L (ref 98–111)
Creatinine, Ser: 0.83 mg/dL (ref 0.61–1.24)
GFR, Estimated: >60 mL/min (ref >60)
Glucose, Bld: 99 mg/dL (ref 70–99)
Potassium: 4.3 mmol/L (ref 3.5–5.1)
Sodium: 137 mmol/L (ref 135–145)
Total Bilirubin: 1.2 mg/dL (ref 0.3–1.2)
Total Protein: 8.1 g/dL (ref 6.5–8.1)

## 2021-03-12 LAB — ETHANOL: Alcohol, Ethyl (B): <10 mg/dL (ref <10)

## 2021-03-12 LAB — CBC
HCT: 45.5 % (ref 39.0–52.0)
Hemoglobin: 15.2 g/dL (ref 13.0–17.0)
MCH: 29.7 pg (ref 26.0–34.0)
MCHC: 33.4 g/dL (ref 30.0–36.0)
MCV: 88.9 fL (ref 80.0–100.0)
Platelets: 177 10*3/uL (ref 150–400)
RBC: 5.12 MIL/uL (ref 4.22–5.81)
RDW: 12.4 % (ref 11.5–15.5)
WBC: 5.6 10*3/uL (ref 4.0–10.5)
nRBC: 0 % (ref 0.0–0.2)

## 2021-03-12 LAB — RAPID URINE DRUG SCREEN, HOSP PERFORMED
Amphetamines: NOT DETECTED
Barbiturates: NOT DETECTED
Benzodiazepines: POSITIVE — AB
Cocaine: POSITIVE — AB
Opiates: NOT DETECTED
Tetrahydrocannabinol: NOT DETECTED

## 2021-03-12 MED ORDER — LORAZEPAM 1 MG PO TABS: 0.0000 mg | ORAL_TABLET | Freq: Two times a day (BID) | ORAL | Status: DC

## 2021-03-12 MED ORDER — LORAZEPAM 1 MG PO TABS
0.0000 mg | ORAL_TABLET | Freq: Four times a day (QID) | ORAL | Status: DC
  Administered 2021-03-12: 2 mg via ORAL
  Filled 2021-03-12: qty 2

## 2021-03-12 MED ORDER — CHLORDIAZEPOXIDE HCL 25 MG PO CAPS: ORAL_CAPSULE | ORAL | 0 refills | Status: DC

## 2021-03-12 MED ORDER — THIAMINE HCL 100 MG PO TABS
100.0000 mg | ORAL_TABLET | Freq: Every day | ORAL | Status: DC
  Administered 2021-03-12: 100 mg via ORAL
  Filled 2021-03-12: qty 1

## 2021-03-12 MED ORDER — THIAMINE HCL 100 MG/ML IJ SOLN: 100.0000 mg | Freq: Every day | INTRAMUSCULAR | Status: DC

## 2021-03-12 MED ORDER — LORAZEPAM 2 MG/ML IJ SOLN: 0.0000 mg | Freq: Two times a day (BID) | INTRAMUSCULAR | Status: DC

## 2021-03-12 MED ORDER — LORAZEPAM 2 MG/ML IJ SOLN: 0.0000 mg | Freq: Four times a day (QID) | INTRAMUSCULAR | Status: DC

## 2021-03-12 NOTE — Discharge Instructions (Addendum)
I have provided a prescription for Librium, please take this medication as prescribed.  Please DO NOT take this medication while consuming alcohol as it can cause harm.  Follow-up with your outpatient team in order to continue with your alcohol detox.

## 2021-03-12 NOTE — ED Provider Notes (Signed)
[facility]-EMERGENCY DEPT Provider Note   CSN: [id number] Arrival date & time: 03/12/21  1417     History Chief Complaint  Patient presents with  . Withdrawal    [name] is a 30 y.o. male.  30 y.o male with a PMH of alcohol dependence presents to the ED with a chief complaint of active withdrawals.  Reports his last alcoholic beverage was approximately 16 hours ago, he feels more anxious, shaky, complains of tremors.  He was last admitted for DTs 4 to 5 months ago, states he was evaluated recently in the emergency department.  Does state he was given a prescription for medication, however did not fill this medication after his previous visit into the ED.  Reports he feels very anxious, shaky.  He is afraid about going into DTs again.  He does not have any SI, HI, visual or auditory hallucinations.  The history is provided by the patient and medical records.       Past Medical History:  Diagnosis Date  . Alcohol dependence (HCC)   . COVID-19   . Suicidal behavior with attempted self-injury [facility])     Patient Active Problem List   Diagnosis Date Noted  . MDD (major depressive disorder), recurrent episode, severe (HCC) 04/18/2020  . 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 No pertinent surgical history.     Family History  Problem Relation Age of Onset  . Lupus Mother   . Hypertension Father     Social History   Tobacco Use  . Smoking status: Current Every Day Smoker    Types: E-cigarettes  . Smokeless tobacco: Never Used  Vaping Use  . Vaping Use: Every day  . Substances: Nicotine, Flavoring  Substance Use Topics  . Alcohol use: Yes    Alcohol/week: 70.0 standard drinks    Types: 70 Standard drinks or equivalent per week  . Drug use: Not Currently    Types: Benzodiazepines, Cocaine    Home Medications Prior to Admission medications   Medication Sig Start Date End Date Taking?  Authorizing Provider  chlordiazePOXIDE (LIBRIUM) 25 MG capsule 50mg  PO TID x 1D, then 25-50mg  PO BID X 1D, then 25-50mg  PO QD X 1D 03/12/21  Yes Calirose Mccance, PA-C  amLODipine (NORVASC) 5 MG tablet Take 1 tablet (5 mg total) by mouth daily. Patient not taking: No sig reported 04/24/20   Money, 04/26/20, FNP  busPIRone (BUSPAR) 5 MG tablet Take 1 tablet (5 mg total) by mouth 3 (three) times daily. Patient not taking: No sig reported 04/23/20   Money, 04/25/20, FNP  mirtazapine (REMERON) 15 MG tablet Take 1 tablet (15 mg total) by mouth at bedtime. 04/23/20   Money, 04/25/20, FNP  venlafaxine XR (EFFEXOR-XR) 150 MG 24 hr capsule Take 1 capsule (150 mg total) by mouth daily with breakfast. 04/24/20   Money, 04/26/20, FNP    Allergies    Patient has no known allergies.  Review of Systems   Review of Systems  Constitutional: Negative for fever.  HENT: Negative for sore throat.   Respiratory: Negative for shortness of breath.   Cardiovascular: Negative for chest pain.  Gastrointestinal: Negative for abdominal pain, diarrhea, nausea and vomiting.  Genitourinary: Negative for flank pain.  Musculoskeletal: Negative for back pain.  Skin: Negative for pallor and wound.  Neurological: Positive for dizziness. Negative for light-headedness and headaches.  Psychiatric/Behavioral: Positive for confusion and decreased concentration. Negative for hallucinations, self-injury, sleep disturbance  and suicidal ideas. The patient is nervous/anxious.   All other systems reviewed and are negative.   Physical Exam Updated Vital Signs BP (!) 145/106   Pulse 86   Temp 98.8 F (37.1 C) (Oral)   Resp 18   Ht 6\' 1"  (1.854 m)   Wt 83.9 kg   SpO2 99%   BMI 24.41 kg/m   Physical Exam Vitals and nursing note reviewed.  HENT:     Head: Normocephalic and atraumatic.     Nose: Nose normal.     Mouth/Throat:     Mouth: Mucous membranes are dry.  Eyes:     Pupils: Pupils are equal, round, and reactive to light.   Cardiovascular:     Rate and Rhythm: Tachycardia present.  Pulmonary:     Effort: Pulmonary effort is normal.     Breath sounds: No wheezing or rales.  Abdominal:     General: Abdomen is flat.     Palpations: Abdomen is soft.     Tenderness: There is no abdominal tenderness. There is no right CVA tenderness or left CVA tenderness.  Musculoskeletal:     Cervical back: Normal range of motion and neck supple.  Skin:    General: Skin is warm and dry.  Neurological:     Mental Status: He is alert and oriented to person, place, and time.  Psychiatric:        Mood and Affect: Mood is anxious.        Speech: Speech is rapid and pressured.        Behavior: Behavior is cooperative.        Thought Content: Thought content normal. Thought content does not include suicidal ideation. Thought content does not include homicidal or suicidal plan.     ED Results / Procedures / Treatments   Labs (all labs ordered are listed, but only abnormal results are displayed) Labs Reviewed  RAPID URINE DRUG SCREEN, HOSP PERFORMED - Abnormal; Notable for the following components:      Result Value   Cocaine POSITIVE (*)    Benzodiazepines POSITIVE (*)    All other components within normal limits  CBC  COMPREHENSIVE METABOLIC PANEL  ETHANOL    EKG None  Radiology No results found.  Procedures Procedures   Medications Ordered in ED Medications  LORazepam (ATIVAN) injection 0-4 mg ( Intravenous See Alternative 03/12/21 1600)    Or  LORazepam (ATIVAN) tablet 0-4 mg (2 mg Oral Given 03/12/21 1600)  LORazepam (ATIVAN) injection 0-4 mg (has no administration in time range)    Or  LORazepam (ATIVAN) tablet 0-4 mg (has no administration in time range)  thiamine tablet 100 mg (100 mg Oral Given 03/12/21 1600)    Or  thiamine (B-1) injection 100 mg ( Intravenous See Alternative 03/12/21 1600)    ED Course  I have reviewed the triage vital signs and the nursing notes.  Pertinent labs & imaging results  that were available during my care of the patient were reviewed by me and considered in my medical decision making (see chart for details).  Clinical Course as of 03/12/21 1806  Thu Mar 12, 2021  1702 COCAINE(!): POSITIVE [JS]  1702 Benzodiazepines(!): POSITIVE [JS]    Clinical Course User Index [JS] Mar 14, 2021, PA-C   MDM Rules/Calculators/A&P  Patient with a past medical history of alcohol abuse presents to the ED with a chief complaint of withdrawals.  Reports his last drink was approximately 15 hours ago, states he feels shaky, anxious, having some  bilateral hand tremor.  Last admission for DTs approximately 6 months ago.  States he was given a prescription here last time to take for his DTs, failed to fill medication.  During evaluation patient appears anxious, shaky, does have uncontrolled movement of his tongue on evaluation.  CIWA score calculated by nursing staff:  Nausea and Vomiting: mild nausea with no vomiting Tactile Disturbances: very mild itching, pins and needles, burning or numbness Tremor: three Auditory Disturbances: not present Paroxysmal Sweats: no sweat visible Visual Disturbances: not present Anxiety: moderately anxious, or guarded, so anxiety is inferred Headache, Fullness in Head: very mild Agitation: somewhat more than normal activity Orientation and Clouding of Sensorium: oriented and can do serial additions CIWA-Ar Total: 11  Interpretation of his labs revealed a ethanol level was negative.  CMP without any electrolyte derangement, creatinine levels within normal limits.  LFTs are unremarkable.  CBC without leukocytosis, hemoglobin stable.  UDS is positive for cocaine along with benzo, suspect pain likely attributing to his anxious along with shakiness in addition to alcohol withdrawal.   5:49 PM patient reevaluated by me, reports improvement in symptoms after Ativan p.o., he states that he would like to stop drinking alcohol at this time.  He has  previously taken Librium in the past, to help with withdrawal symptoms, would like a prescription for this at this time.  He agrees to not consume any alcohol while he is taking this medication.  We discussed the risk and benefits.  Reevaluation of his vitals showed remain in his heart rate, blood pressure slightly elevated but he reports feeling much better after receiving Ativan.  Prescription for Librium has been given.  Strict return precautions discussed at length.   Portions of this note were generated with Scientist, clinical (histocompatibility and immunogenetics). Dictation errors may occur despite best attempts at proofreading.  Final Clinical Impression(s) / ED Diagnoses Final diagnoses:  Alcohol withdrawal syndrome without complication [facility])    Rx / DC Orders ED Discharge Orders         Ordered    chlordiazePOXIDE (LIBRIUM) 25 MG capsule        03/12/21 1744           Claude Manges, PA-C 03/12/21 1806    Cathren Laine, MD 03/14/21 2224

## 2021-03-12 NOTE — ED Triage Notes (Signed)
Emergency Medicine Provider Triage Evaluation Note  [name] , a 30 y.o. male  was evaluated in triage.  Pt complains of alcohol withdrawals, his last drink was 14-16 hours. He feels like "my anxiety is through the roof" "foggy brain""shaky", no food intake today. Previously hospitalized for DTs in the past, likely 4-5 months ago. No SI, HI, no visual or auditory hallucinations.   Review of Systems  Positive: Anxious, dizziness, shaky  Negative: SI,HI  Physical Exam  BP (!) 149/114   Pulse 98   Temp 98.5 F (36.9 C) (Oral)   Resp 18   Ht 6\' 1"  (1.854 m)   Wt 83.9 kg   SpO2 100%   BMI 24.41 kg/m  Gen:   Awake, anxious HEENT:  Atraumatic  Resp:  Normal effort  Cardiac:  Tachycardia Abd:   Nondistended, nontender  MSK:   Moves extremities without difficulty  Neuro:  Speech clear,rapid  Medical Decision Making  Medically screening exam initiated at 2:40 PM.  Appropriate orders placed.  COE ANGELOS was informed that the remainder of the evaluation will be completed by another provider, this initial triage assessment does not replace that evaluation, and the importance of remaining in the ED until their evaluation is complete.  Clinical Impression  Patient here for alcohol detox, states he is in withdrawal symptoms such as shakiness, anxious. Prior admission for DTs about 4-5 months. BP noted to be elevate on arrival 149/114 HR 98. Currently on antidepressant medication.     Portions of this note were generated with [name]. Dictation errors may occur despite best attempts at proofreading.    Scientist, clinical (histocompatibility and immunogenetics), PA-C 03/12/21 1449

## 2021-03-12 NOTE — ED Triage Notes (Signed)
Patient c/o withdrawal from alcohol. Patient states he normally drinks 10-18 beers daily. Patient states he has not had alcohol in 14 hours.

## 2021-05-22 ENCOUNTER — Other Ambulatory Visit: Payer: Self-pay

## 2021-05-22 ENCOUNTER — Encounter (HOSPITAL_COMMUNITY): Payer: Self-pay

## 2021-05-22 ENCOUNTER — Emergency Department (HOSPITAL_COMMUNITY)
Admission: EM | Admit: 2021-05-22 | Discharge: 2021-05-22 | Disposition: A | Discharge status: Home / Self Care | Payer: Self-pay | Attending: Emergency Medicine | Admitting: Emergency Medicine

## 2021-05-22 DIAGNOSIS — Y906 Blood alcohol level of 120-199 mg/100 ml: Secondary | ICD-10-CM | POA: Insufficient documentation

## 2021-05-22 DIAGNOSIS — F1010 Alcohol abuse, uncomplicated: Secondary | ICD-10-CM | POA: Insufficient documentation

## 2021-05-22 DIAGNOSIS — F17210 Nicotine dependence, cigarettes, uncomplicated: Secondary | ICD-10-CM | POA: Insufficient documentation

## 2021-05-22 DIAGNOSIS — Z8616 Personal history of COVID-19: Secondary | ICD-10-CM | POA: Insufficient documentation

## 2021-05-22 LAB — CBC WITH DIFFERENTIAL/PLATELET
Abs Immature Granulocytes: 0.05 10*3/uL (ref 0.00–0.07)
Basophils Absolute: 0.1 10*3/uL (ref 0.0–0.1)
Basophils Relative: 1 %
Eosinophils Absolute: 0 10*3/uL (ref 0.0–0.5)
Eosinophils Relative: 0 %
HCT: 44.9 % (ref 39.0–52.0)
Hemoglobin: 15.2 g/dL (ref 13.0–17.0)
Immature Granulocytes: 1 %
Lymphocytes Relative: 11 %
Lymphs Abs: 1.2 10*3/uL (ref 0.7–4.0)
MCH: 30.2 pg (ref 26.0–34.0)
MCHC: 33.9 g/dL (ref 30.0–36.0)
MCV: 89.3 fL (ref 80.0–100.0)
Monocytes Absolute: 0.6 10*3/uL (ref 0.1–1.0)
Monocytes Relative: 5 %
Neutro Abs: 8.9 10*3/uL — ABNORMAL HIGH (ref 1.7–7.7)
Neutrophils Relative %: 82 %
Platelets: 142 10*3/uL — ABNORMAL LOW (ref 150–400)
RBC: 5.03 MIL/uL (ref 4.22–5.81)
RDW: 12.3 % (ref 11.5–15.5)
WBC: 10.9 10*3/uL — ABNORMAL HIGH (ref 4.0–10.5)
nRBC: 0 % (ref 0.0–0.2)

## 2021-05-22 LAB — COMPREHENSIVE METABOLIC PANEL
ALT: 22 U/L (ref 0–44)
AST: 25 U/L (ref 15–41)
Albumin: 4.5 g/dL (ref 3.5–5.0)
Alkaline Phosphatase: 59 U/L (ref 38–126)
Anion gap: 10 (ref 5–15)
BUN: 11 mg/dL (ref 6–20)
CO2: 25 mmol/L (ref 22–32)
Calcium: 9.6 mg/dL (ref 8.9–10.3)
Chloride: 103 mmol/L (ref 98–111)
Creatinine, Ser: 0.91 mg/dL (ref 0.61–1.24)
GFR, Estimated: >60 mL/min (ref >60)
Glucose, Bld: 125 mg/dL — ABNORMAL HIGH (ref 70–99)
Potassium: 3.8 mmol/L (ref 3.5–5.1)
Sodium: 138 mmol/L (ref 135–145)
Total Bilirubin: 0.6 mg/dL (ref 0.3–1.2)
Total Protein: 8 g/dL (ref 6.5–8.1)

## 2021-05-22 LAB — ACETAMINOPHEN LEVEL: Acetaminophen (Tylenol), Serum: <10 ug/mL — ABNORMAL LOW (ref 10–30)

## 2021-05-22 LAB — LIPASE, BLOOD: Lipase: 57 U/L — ABNORMAL HIGH (ref 11–51)

## 2021-05-22 LAB — ETHANOL: Alcohol, Ethyl (B): 145 mg/dL — ABNORMAL HIGH (ref <10)

## 2021-05-22 LAB — SALICYLATE LEVEL: Salicylate Lvl: <7 mg/dL — ABNORMAL LOW (ref 7.0–30.0)

## 2021-05-22 MED ORDER — CHLORDIAZEPOXIDE HCL 25 MG PO CAPS: ORAL_CAPSULE | ORAL | 0 refills | Status: DC

## 2021-05-22 MED ORDER — LORAZEPAM 1 MG PO TABS
1.0000 mg | ORAL_TABLET | Freq: Once | ORAL | Status: AC
  Administered 2021-05-22: 1 mg via ORAL
  Filled 2021-05-22: qty 1

## 2021-05-22 NOTE — ED Provider Notes (Signed)
[facility]-EMERGENCY DEPT Provider Note   CSN: [id number] Arrival date & time: 05/22/21  1311     History Chief Complaint  Patient presents with   Alcohol Problem    [name] is a 30 y.o. male with past medical with past significant for alcohol dependence, depression who sent for evaluation possible alcohol withdrawal.  Patient's last drink was 4 hours PTA.  States he tried to come off alcohol yesterday evening however due to his tremors he decided to drink.  He is on Librium taper as before which have helped.  He states he feels shaky and has a "foggy brain."  He denies any illicit substances.  He denies any SI, HI, AVH.  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  Denies headache, syncope, blurred vision, paresthesias, weakness, chest pain, shortness of breath, abdominal pain.  He has been tolerating p.o. intake at home without difficulty.  Denies additional aggravating or alleviating factors.  History obtained from patient and past medical records.  No interpreter used.  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 No pertinent surgical history.     Family History  Problem Relation Age of Onset   Lupus Mother    Hypertension Father     Social History   Tobacco Use   Smoking status: Every Day    Pack years: 0.00    Types: E-cigarettes   Smokeless tobacco: Never  Vaping Use   Vaping Use: Every day   Substances: Nicotine, Flavoring  Substance Use Topics   Alcohol use: Yes    Alcohol/week: 70.0 standard drinks    Types: 70 Standard drinks or equivalent per week   Drug use: Not Currently    Types: Benzodiazepines, Cocaine     Home Medications Prior to Admission medications   Medication Sig Start Date End Date Taking? Authorizing Provider  chlordiazePOXIDE (LIBRIUM) 25 MG capsule 50mg  PO TID x 1D, then 25-50mg  PO BID X 1D, then 25-50mg  PO QD X 1D 05/22/21  Yes Harpreet Pompey A, PA-C  mirtazapine (REMERON) 15 MG tablet Take 1 tablet (15 mg total) by mouth at bedtime. 04/23/20   Money, 04/25/20, FNP  venlafaxine XR (EFFEXOR-XR) 150 MG 24 hr capsule Take 1 capsule (150 mg total) by mouth daily with breakfast. 04/24/20   Money, 04/26/20, FNP    Allergies    Patient has no known allergies.  Review of Systems   Review of Systems  Constitutional: Negative.   HENT: Negative.    Respiratory: Negative.    Cardiovascular: Negative.   Gastrointestinal: Negative.   Genitourinary: Negative.   Musculoskeletal: Negative.   Skin: Negative.   Neurological:  Positive for tremors.  All other systems reviewed and are negative.  Physical Exam Updated Vital Signs BP (!) 149/107   Pulse 86   Temp 97.9 F (36.6 C) (Oral)   Resp 20   SpO2 96%   Physical Exam Vitals and nursing note reviewed.  Constitutional:      General: He is not in acute distress.    Appearance: He is well-developed. He is not ill-appearing, toxic-appearing or diaphoretic.  HENT:     Head: Normocephalic and atraumatic.  Nose: Nose normal.     Mouth/Throat:     Mouth: Mucous membranes are moist.  Eyes:     Pupils: Pupils are equal, round, and reactive to light.  Cardiovascular:     Rate and Rhythm: Normal rate and regular rhythm.     Pulses: Normal pulses.     Heart sounds: Normal heart sounds.  Pulmonary:     Effort: Pulmonary effort is normal. No respiratory distress.     Breath sounds: Normal breath sounds.  Abdominal:     General: Bowel sounds are normal. There is no distension.     Palpations: Abdomen is soft.     Tenderness: There is no abdominal tenderness. There is no guarding or rebound.  Musculoskeletal:        General:  No swelling, tenderness, deformity or signs of injury. Normal range of motion.     Cervical back: Normal range of motion and neck supple.  Skin:    General: Skin is warm and dry.     Capillary Refill: Capillary refill takes less than 2 seconds.  Neurological:     General: No focal deficit present.     Mental Status: He is alert and oriented to person, place, and time.     Comments: Cranial nerves II to XII grossly intact Ambulatory that difficulty Very minimal tremors  Psychiatric:     Comments: Denies SI, HI, AVH    ED Results / Procedures / Treatments   Labs (all labs ordered are listed, but only abnormal results are displayed) Labs Reviewed  CBC WITH DIFFERENTIAL/PLATELET - Abnormal; Notable for the following components:      Result Value   WBC 10.9 (*)    Platelets 142 (*)    Neutro Abs 8.9 (*)    All other components within normal limits  COMPREHENSIVE METABOLIC PANEL - Abnormal; Notable for the following components:   Glucose, Bld 125 (*)    All other components within normal limits  LIPASE, BLOOD - Abnormal; Notable for the following components:   Lipase 57 (*)    All other components within normal limits  ETHANOL - Abnormal; Notable for the following components:   Alcohol, Ethyl (B) 145 (*)    All other components within normal limits  SALICYLATE LEVEL - Abnormal; Notable for the following components:   Salicylate Lvl <7.0 (*)    All other components within normal limits  ACETAMINOPHEN LEVEL - Abnormal; Notable for the following components:   Acetaminophen (Tylenol), Serum <10 (*)    All other components within normal limits  RAPID URINE DRUG SCREEN, HOSP PERFORMED    EKG None  Radiology No results found.  Procedures Procedures   Medications Ordered in ED Medications  LORazepam (ATIVAN) tablet 1 mg (1 mg Oral Given 05/22/21 1757)    ED Course  I have reviewed the triage vital signs and the nursing notes.  Pertinent labs & imaging results that were  available during my care of the patient were reviewed by me and considered in my medical decision making (see chart for details).  30 year old here for evaluation of possible EtOH withdrawal.  Has extensive history of withdrawal and alcohol dependence.  He is afebrile, nonseptic, not ill-appearing.  Apparently tried to come off alcohol yesterday felt shaky and nauseous earlier today subsequently had 3 beers to help.  Patient does have history of DTs and withdrawal seizures however patient states he has had Librium tapers previously and they have helped.  Does have some mild tremors on exam however  appears overall well.  No SI, HI, AVH.  Heart and lungs clear.  Abdomen soft, nontender.  No hallucinations, emesis, headache.  Patient given p.o. Ativan here  Labs personally reviewed and interpreted:  CBC with leukocytosis at 10.9 Metabolic panel glucose 125, no additional electrolyte, renal or liver abnormality Lipase 57 Ethanol 145  Reassessed patient.  He feels significantly improved, tolerating p.o. intake.  He would like to go home.  He is requesting Librium taper.  I feel is reasonable.  He will be given outpatient resources.  Repeat CIWA 2  Discussed return precautions and to return if he has any worsening symptoms.  He is agreeable  The patient has been appropriately medically screened and/or stabilized in the ED. I have low suspicion for any other emergent medical condition which would require further screening, evaluation or treatment in the ED or require inpatient management.  Patient is hemodynamically stable and in no acute distress.  Patient able to ambulate in department prior to ED.  Evaluation does not show acute pathology that would require ongoing or additional emergent interventions while in the emergency department or further inpatient treatment.  I have discussed the diagnosis with the patient and answered all questions.  Pain is been managed while in the emergency department  and patient has no further complaints prior to discharge.  Patient is comfortable with plan discussed in room and is stable for discharge at this time.  I have discussed strict return precautions for returning to the emergency department.  Patient was encouraged to follow-up with PCP/specialist refer to at discharge.     MDM Rules/Calculators/A&P                           Final Clinical Impression(s) / ED Diagnoses Final diagnoses:  Alcohol abuse    Rx / DC Orders ED Discharge Orders          Ordered    chlordiazePOXIDE (LIBRIUM) 25 MG capsule        05/22/21 1915             Abbigale Mcelhaney A, PA-C 05/22/21 1917    Mancel Bale, MD 05/23/21 1148

## 2021-05-22 NOTE — Discharge Instructions (Addendum)
Take the medications as prescribed  Return for new or worsening symptoms 

## 2021-05-22 NOTE — ED Provider Notes (Signed)
Emergency Medicine Provider Triage Evaluation Note  [name] , [name] 30 y.o. male  was evaluated in triage.  Pt complains of fells like he is Wd from alcohol. Last drink 3-4 hours PTA. Feel shaky, sweaty, foggy brain. No illicit substances. No SI, HI, AVH. Inpatient may years go. Hx of WD seizure   Review of Systems  Positive: Sweaty, Shaking, nausea Negative: CP, SOB, emesis, diarrhea  Physical Exam  BP (!) 151/109 (BP Location: Left Arm)   Pulse (!) 103   Temp 98.7 F (37.1 C) (Oral)   Resp 18   SpO2 97%  Gen:   Awake, no distress   Resp:  Normal effort  MSK:   Moves extremities without difficulty  Other:  No tremor, No SI, Hi, AVH  Medical Decision Making  Medically screening exam initiated at 1:54 PM.  Appropriate orders placed.  [name] was informed that the remainder of the evaluation will be completed by another provider, this initial triage assessment does not replace that evaluation, and the importance of remaining in the ED until their evaluation is complete.   Etoh use  Stable, labs     Jeff Graffam A, PA-C 05/22/21 1357    Mancel Bale, MD 05/23/21 1148

## 2021-05-22 NOTE — ED Triage Notes (Signed)
Pt arrived via walk in, states alcohol withdrawal. Endorses x10 beers a day. Last drink (3 beers) 3 hours ago. C/o anxiety and tremors.

## 2021-06-06 ENCOUNTER — Emergency Department (HOSPITAL_COMMUNITY)
Admission: EM | Admit: 2021-06-06 | Discharge: 2021-06-07 | Disposition: A | Discharge status: Home / Self Care | Payer: Self-pay | Attending: Emergency Medicine | Admitting: Emergency Medicine

## 2021-06-06 ENCOUNTER — Other Ambulatory Visit: Payer: Self-pay

## 2021-06-06 DIAGNOSIS — Z8616 Personal history of COVID-19: Secondary | ICD-10-CM | POA: Insufficient documentation

## 2021-06-06 DIAGNOSIS — R000 Tachycardia, unspecified: Secondary | ICD-10-CM | POA: Insufficient documentation

## 2021-06-06 DIAGNOSIS — R110 Nausea: Secondary | ICD-10-CM | POA: Insufficient documentation

## 2021-06-06 DIAGNOSIS — F17290 Nicotine dependence, other tobacco product, uncomplicated: Secondary | ICD-10-CM | POA: Insufficient documentation

## 2021-06-06 DIAGNOSIS — F1020 Alcohol dependence, uncomplicated: Secondary | ICD-10-CM | POA: Insufficient documentation

## 2021-06-06 DIAGNOSIS — R251 Tremor, unspecified: Secondary | ICD-10-CM | POA: Insufficient documentation

## 2021-06-06 DIAGNOSIS — I10 Essential (primary) hypertension: Secondary | ICD-10-CM | POA: Insufficient documentation

## 2021-06-06 DIAGNOSIS — F10230 Alcohol dependence with withdrawal, uncomplicated: Secondary | ICD-10-CM

## 2021-06-07 MED ORDER — CHLORDIAZEPOXIDE HCL 25 MG PO CAPS: ORAL_CAPSULE | ORAL | 0 refills | Status: DC

## 2021-06-07 MED ORDER — ONDANSETRON 4 MG PO TBDP
4.0000 mg | ORAL_TABLET | Freq: Once | ORAL | Status: AC
Start: 2021-06-07 — End: 2021-06-07
  Administered 2021-06-07: 4 mg via ORAL
  Filled 2021-06-07: qty 1

## 2021-06-07 NOTE — ED Notes (Signed)
Pt tolerated of water with no issue. States that his nausea is easing up. Provider made aware

## 2021-06-07 NOTE — Discharge Instructions (Signed)
Follow up with outpatient resources where you are established.

## 2021-06-07 NOTE — ED Provider Notes (Signed)
[facility]-EMERGENCY DEPT Provider Note   CSN: [id number] Arrival date & time: 06/06/21  2335     History No chief complaint on file.   [name] is a 30 y.o. male.  Last ETOH about 7 hours ago. History of alcohol withdrawal in the past. Now having symptoms of shakiness, trouble focusing, nausea without vomiting. No seizures, no history of seizures. Last admission was April, [facility], after librium taper did well for a short period. No SI/HI/AVH. Reports the Librium taper helps with symptoms. He is connected to outpatient resources.   The history is provided by the patient. No language interpreter was used.      Past Medical History:  Diagnosis Date   Alcohol dependence (HCC)    COVID-19    Suicidal behavior with attempted self-injury [facility])     Patient Active Problem List   Diagnosis Date Noted   MDD (major depressive disorder), recurrent episode, severe (HCC) 04/18/2020   Alcohol withdrawal, with perceptual disturbance (HCC) 09/15/2018    No past surgical history on file.     Family History  Problem Relation Age of Onset   Lupus Mother    Hypertension Father     Social History   Tobacco Use   Smoking status: Every Day    Pack years: 0.00    Types: E-cigarettes   Smokeless tobacco: Never  Vaping Use   Vaping Use: Every day   Substances: Nicotine, Flavoring  Substance Use Topics   Alcohol use: Yes    Alcohol/week: 70.0 standard drinks    Types: 70 Standard drinks or equivalent per week   Drug use: Not Currently    Types: Benzodiazepines, Cocaine    Home Medications Prior to Admission medications   Medication Sig Start Date End Date Taking? Authorizing Provider  chlordiazePOXIDE (LIBRIUM) 25 MG capsule 50mg  PO TID x 1D, then 25-50mg  PO BID X 1D, then 25-50mg  PO QD X 1D 05/22/21   Henderly, Britni A, PA-C  mirtazapine (REMERON) 15 MG tablet Take 1 tablet (15 mg total) by mouth at bedtime. 04/23/20   Money, 04/25/20, FNP  venlafaxine XR  (EFFEXOR-XR) 150 MG 24 hr capsule Take 1 capsule (150 mg total) by mouth daily with breakfast. 04/24/20   Money, 04/26/20, FNP    Allergies    Patient has no known allergies.  Review of Systems   Review of Systems  Constitutional:  Negative for chills and fever.  HENT: Negative.    Eyes:  Negative for visual disturbance.  Respiratory: Negative.  Negative for shortness of breath.   Cardiovascular: Negative.  Negative for chest pain.  Gastrointestinal:  Positive for nausea. Negative for vomiting.  Musculoskeletal: Negative.  Negative for myalgias.  Skin: Negative.   Neurological:  Positive for tremors. Negative for seizures.  Psychiatric/Behavioral:  Negative for confusion and hallucinations.    Physical Exam Updated Vital Signs BP (!) 153/115   Pulse (!) 104   Temp 98.6 F (37 C)   Resp (!) 24   SpO2 98%   Physical Exam Vitals and nursing note reviewed.  Constitutional:      Appearance: He is well-developed.  HENT:     Head: Normocephalic.  Eyes:     General: No scleral icterus. Cardiovascular:     Rate and Rhythm: Regular rhythm. Tachycardia present.     Heart sounds: No murmur heard. Pulmonary:     Effort: Pulmonary effort is normal.     Breath sounds: Normal breath sounds. No wheezing, rhonchi or rales.  Abdominal:     General: Bowel sounds are normal.     Palpations: Abdomen is soft.     Tenderness: There is no abdominal tenderness. There is no guarding or rebound.  Musculoskeletal:        General: Normal range of motion.     Cervical back: Normal range of motion and neck supple.  Skin:    General: Skin is warm and dry.     Coloration: Skin is not jaundiced.  Neurological:     General: No focal deficit present.     Mental Status: He is alert and oriented to person, place, and time.     Comments: Mild UE tremors. Alert, oriented. No asterixis.   Psychiatric:        Attention and Perception: Attention and perception normal.        Mood and Affect: Mood  normal.        Speech: Speech normal.        Behavior: Behavior is cooperative.        Thought Content: Thought content does not include homicidal or suicidal ideation.        Cognition and Memory: Cognition normal.    ED Results / Procedures / Treatments   Labs (all labs ordered are listed, but only abnormal results are displayed) Labs Reviewed - No data to display  EKG None  Radiology No results found.  Procedures Procedures   Medications Ordered in ED Medications - No data to display  ED Course  I have reviewed the triage vital signs and the nursing notes.  Pertinent labs & imaging results that were available during my care of the patient were reviewed by me and considered in my medical decision making (see chart for details).    MDM Rules/Calculators/A&P                          Patient with history of alcohol withdrawal here with symptoms similar to previous withdrawal. No history of seizures.   He has minimal symptoms at this point. Mild HTN, minimal tachycardia and tremors. He is alert, oriented. Not hallucinating. He reports nausea. Zofran provided.   He is tolerating PO fluids without further nausea. No vomiting. He is felt appropriate for discharge. He has outpatient resources in place. Librium provided. Per database he has received this taper on multiple occasions, and though he states it helps with symptoms, it was discussed that continuous prescriptions for this controlled substance would not be available.   Final Clinical Impression(s) / ED Diagnoses Final diagnoses:  None   Alcohol dependence  Rx / DC Orders ED Discharge Orders     None        Danne Harbor 06/07/21 0136    Zadie Rhine, MD 06/07/21 [phone]

## 2021-06-07 NOTE — ED Triage Notes (Signed)
Pt came in with c/o withdrawal. Last drink of alcohol seven hours ago. Endorses nausea and shaking.

## 2021-06-12 ENCOUNTER — Emergency Department (HOSPITAL_COMMUNITY)
Admission: EM | Admit: 2021-06-12 | Discharge: 2021-06-13 | Disposition: A | Discharge status: Home / Self Care | Payer: Self-pay | Attending: Emergency Medicine | Admitting: Emergency Medicine

## 2021-06-12 ENCOUNTER — Other Ambulatory Visit: Payer: Self-pay

## 2021-06-12 ENCOUNTER — Encounter (HOSPITAL_COMMUNITY): Payer: Self-pay | Admitting: Emergency Medicine

## 2021-06-12 DIAGNOSIS — Z5321 Procedure and treatment not carried out due to patient leaving prior to being seen by health care provider: Secondary | ICD-10-CM | POA: Insufficient documentation

## 2021-06-12 DIAGNOSIS — F10130 Alcohol abuse with withdrawal, uncomplicated: Secondary | ICD-10-CM | POA: Insufficient documentation

## 2021-06-12 LAB — COMPREHENSIVE METABOLIC PANEL
ALT: 22 U/L (ref 0–44)
AST: 25 U/L (ref 15–41)
Albumin: 3.8 g/dL (ref 3.5–5.0)
Alkaline Phosphatase: 52 U/L (ref 38–126)
Anion gap: 9 (ref 5–15)
BUN: 14 mg/dL (ref 6–20)
CO2: 23 mmol/L (ref 22–32)
Calcium: 8.9 mg/dL (ref 8.9–10.3)
Chloride: 104 mmol/L (ref 98–111)
Creatinine, Ser: 1.02 mg/dL (ref 0.61–1.24)
GFR, Estimated: >60 mL/min (ref >60)
Glucose, Bld: 99 mg/dL (ref 70–99)
Potassium: 3.5 mmol/L (ref 3.5–5.1)
Sodium: 136 mmol/L (ref 135–145)
Total Bilirubin: 0.4 mg/dL (ref 0.3–1.2)
Total Protein: 7.2 g/dL (ref 6.5–8.1)

## 2021-06-12 LAB — CBC
HCT: 39.1 % (ref 39.0–52.0)
Hemoglobin: 13.1 g/dL (ref 13.0–17.0)
MCH: 29.9 pg (ref 26.0–34.0)
MCHC: 33.5 g/dL (ref 30.0–36.0)
MCV: 89.3 fL (ref 80.0–100.0)
Platelets: 153 10*3/uL (ref 150–400)
RBC: 4.38 MIL/uL (ref 4.22–5.81)
RDW: 12.2 % (ref 11.5–15.5)
WBC: 6.4 10*3/uL (ref 4.0–10.5)
nRBC: 0 % (ref 0.0–0.2)

## 2021-06-12 LAB — ETHANOL: Alcohol, Ethyl (B): 95 mg/dL — ABNORMAL HIGH (ref <10)

## 2021-06-12 LAB — RAPID URINE DRUG SCREEN, HOSP PERFORMED
Amphetamines: NOT DETECTED
Barbiturates: NOT DETECTED
Benzodiazepines: POSITIVE — AB
Cocaine: POSITIVE — AB
Opiates: NOT DETECTED
Tetrahydrocannabinol: NOT DETECTED

## 2021-06-12 NOTE — ED Triage Notes (Addendum)
Pt was seen in [facility] 7/2 was given medication to help with ETOH withdraws. Has completed medications with recurrent symptoms. Has continued to drink. Last drink: 3 hours ago, drink throughout day about 7 beers. C/o feeling shakes, feeling on edge. Denies SI/HI. Pt wants more benzos to help with withdraws   CIWA score: 3 in triage d/t mild numbness in bilateral hands, mild tremor, and mild agitation.

## 2021-06-13 NOTE — ED Notes (Signed)
Pt name called three times with no answer

## 2021-06-29 ENCOUNTER — Encounter (HOSPITAL_COMMUNITY): Payer: Self-pay | Admitting: *Deleted

## 2021-06-29 ENCOUNTER — Other Ambulatory Visit: Payer: Self-pay

## 2021-06-29 ENCOUNTER — Emergency Department (HOSPITAL_COMMUNITY): Payer: Self-pay

## 2021-06-29 ENCOUNTER — Emergency Department (HOSPITAL_COMMUNITY)
Admission: EM | Admit: 2021-06-29 | Discharge: 2021-06-29 | Disposition: A | Discharge status: Home / Self Care | Payer: Self-pay | Attending: Emergency Medicine | Admitting: Emergency Medicine

## 2021-06-29 DIAGNOSIS — Z8616 Personal history of COVID-19: Secondary | ICD-10-CM | POA: Insufficient documentation

## 2021-06-29 DIAGNOSIS — F17290 Nicotine dependence, other tobacco product, uncomplicated: Secondary | ICD-10-CM | POA: Insufficient documentation

## 2021-06-29 DIAGNOSIS — R000 Tachycardia, unspecified: Secondary | ICD-10-CM | POA: Insufficient documentation

## 2021-06-29 DIAGNOSIS — W01110A Fall on same level from slipping, tripping and stumbling with subsequent striking against sharp glass, initial encounter: Secondary | ICD-10-CM | POA: Insufficient documentation

## 2021-06-29 DIAGNOSIS — S61011A Laceration without foreign body of right thumb without damage to nail, initial encounter: Secondary | ICD-10-CM

## 2021-06-29 DIAGNOSIS — Z23 Encounter for immunization: Secondary | ICD-10-CM | POA: Insufficient documentation

## 2021-06-29 DIAGNOSIS — Y92009 Unspecified place in unspecified non-institutional (private) residence as the place of occurrence of the external cause: Secondary | ICD-10-CM | POA: Insufficient documentation

## 2021-06-29 DIAGNOSIS — F10230 Alcohol dependence with withdrawal, uncomplicated: Secondary | ICD-10-CM

## 2021-06-29 MED ORDER — LIDOCAINE HCL (PF) 1 % IJ SOLN
5.0000 mL | Freq: Once | INTRAMUSCULAR | Status: AC
  Administered 2021-06-29: 5 mL
  Filled 2021-06-29: qty 30

## 2021-06-29 MED ORDER — LORAZEPAM 1 MG PO TABS
2.0000 mg | ORAL_TABLET | Freq: Once | ORAL | Status: AC
  Administered 2021-06-29: 2 mg via ORAL
  Filled 2021-06-29: qty 2

## 2021-06-29 MED ORDER — TETANUS-DIPHTH-ACELL PERTUSSIS 5-2.5-18.5 LF-MCG/0.5 IM SUSY
0.5000 mL | PREFILLED_SYRINGE | Freq: Once | INTRAMUSCULAR | Status: AC
  Administered 2021-06-29: 0.5 mL via INTRAMUSCULAR
  Filled 2021-06-29: qty 0.5

## 2021-06-29 NOTE — ED Triage Notes (Signed)
Pt here for ETOH withdrawal, last drink was 10 hours ago. Hx of withdrawal seizures. He also fell through a glass door last night and has cuts to his right hand.

## 2021-06-29 NOTE — ED Provider Notes (Signed)
[facility]-EMERGENCY DEPT Provider Note   CSN: [id number] Arrival date & time: 06/29/21  9937     History Chief Complaint  Patient presents with   Alcohol Problem   Hand Injury    [name] is a 30 y.o. male.  [name] states that when [name] woke up this morning and saw the state of his hand and realized what had happened, [name] decided that [name] no longer wanted to drink alcohol.  [name] has been struggling with alcohol dependence for quite some time, and [name] states that [name] is established with family services.  [name] has also been an inpatient at [facility] in April.  [name] has had several other ED visits for the same since that time.  The history is provided by the patient.  Hand Injury Location:  Hand Hand location:  R hand Injury: yes   Time since incident:  11 hours Mechanism of injury: fall   Mechanism of injury comment:  Larey Seat coming into his house from a bar and put his hand through his glass door Fall:    Fall occurred:  Walking   Height of fall:  Standing height   Impact surface:  Unable to specify   Point of impact:  Hands Pain details:    Quality:  Throbbing   Radiates to:  Does not radiate   Severity:  Mild   Onset quality:  Sudden   Duration:  11 hours   Timing:  Constant   Progression:  Unchanged Handedness:  Right-handed Dislocation: no   Foreign body present:  No foreign bodies Tetanus status:  Unknown Prior injury to area:  No Relieved by:  Nothing Worsened by:  Nothing Ineffective treatments:  None tried Associated symptoms: no back pain, no decreased range of motion, no fever, no muscle weakness, no numbness, no swelling and no tingling       Past Medical History:  Diagnosis Date   Alcohol dependence (HCC)    COVID-19    Suicidal behavior with attempted self-injury [facility])     Patient Active Problem List   Diagnosis Date Noted   MDD (major depressive disorder), recurrent episode, severe (HCC) 04/18/2020   Alcohol  withdrawal, with perceptual disturbance (HCC) 09/15/2018    History reviewed. No pertinent surgical history.     Family History  Problem Relation Age of Onset   Lupus Mother    Hypertension Father     Social History   Tobacco Use   Smoking status: Every Day    Types: E-cigarettes   Smokeless tobacco: Never  Vaping Use   Vaping Use: Every day   Substances: Nicotine, Flavoring  Substance Use Topics   Alcohol use: Yes    Alcohol/week: 70.0 standard drinks    Types: 70 Standard drinks or equivalent per week   Drug use: Not Currently    Types: Benzodiazepines, Cocaine    Home Medications Prior to Admission medications   Medication Sig Start Date End Date Taking? Authorizing Provider  chlordiazePOXIDE (LIBRIUM) 25 MG capsule 50mg  PO TID x 1D, then 25-50mg  PO BID X 1D, then 25-50mg  PO QD X 1D 06/07/21   Upstill, Shari, PA-C  mirtazapine (REMERON) 15 MG tablet Take 1 tablet (15 mg total) by mouth at bedtime. 04/23/20   Money, 04/25/20, FNP  venlafaxine XR (EFFEXOR-XR) 150 MG 24 hr capsule Take 1 capsule (150 mg total) by mouth daily with breakfast. 04/24/20   Money, 04/26/20, FNP    Allergies    Patient  has no known allergies.  Review of Systems   Review of Systems  Constitutional:  Negative for chills and fever.  HENT:  Negative for ear pain and sore throat.   Eyes:  Negative for pain and visual disturbance.  Respiratory:  Negative for cough and shortness of breath.   Cardiovascular:  Negative for chest pain and palpitations.  Gastrointestinal:  Negative for abdominal pain and vomiting.  Genitourinary:  Negative for dysuria and hematuria.  Musculoskeletal:  Negative for arthralgias and back pain.  Skin:  Positive for wound. Negative for color change and rash.  Neurological:  Negative for seizures and syncope.  All other systems reviewed and are negative.  Physical Exam Updated Vital Signs BP (!) 152/90   Pulse (!) 110   Temp 98.2 F (36.8 C) (Oral)   Resp 16   Ht 6'  1" (1.854 m)   Wt 83.9 kg   SpO2 99%   BMI 24.41 kg/m   Physical Exam Vitals and nursing note reviewed.  Constitutional:      Comments: No distress but appears tremulous  HENT:     Head: Normocephalic and atraumatic.  Eyes:     Conjunctiva/sclera: Conjunctivae normal.  Cardiovascular:     Rate and Rhythm: Tachycardia present.  Pulmonary:     Effort: Pulmonary effort is normal. No respiratory distress.  Musculoskeletal:        General: No deformity. Normal range of motion.     Cervical back: Normal range of motion.     Comments: There is a laceration of the dorsum of the right hand overlying the midpoint of the fourth and fifth metacarpals.  There is also a clamshell shaped laceration on the proximal phalanx of the right thumb.  No sensory deficit of this area.  Tendon function is normal.  No bony deformities.  Skin:    General: Skin is warm and dry.  Neurological:     General: No focal deficit present.     Mental Status: [name] is alert and oriented to person, place, and time. Mental status is at baseline.  Psychiatric:        Mood and Affect: Mood normal.        Behavior: Behavior normal.        Thought Content: Thought content normal.        Judgment: Judgment normal.    ED Results / Procedures / Treatments   Labs (all labs ordered are listed, but only abnormal results are displayed) Labs Reviewed - No data to display  EKG None  Radiology No results found.  Procedures .Marland KitchenLaceration Repair  Date/Time: 06/29/2021 9:37 AM Performed by: Koleen Distance, MD Authorized by: Koleen Distance, MD   Consent:    Consent obtained:  Verbal   Consent given by:  Patient   Risks, benefits, and alternatives were discussed: yes     Risks discussed:  Infection, pain, retained foreign body, need for additional repair, poor cosmetic result, poor wound healing, nerve damage, tendon damage and vascular damage   Alternatives discussed:  No treatment, delayed treatment, observation and  referral Universal protocol:    Procedure explained and questions answered to patient or proxy's satisfaction: yes     Test results available: yes     Immediately prior to procedure, a time out was called: yes     Patient identity confirmed:  Verbally with patient Anesthesia:    Anesthesia method:  Local infiltration   Local anesthetic:  Lidocaine 1% w/o epi Laceration details:  Location:  Finger   Finger location:  R thumb   Length (cm):  1   Depth (mm):  5 Pre-procedure details:    Preparation:  Patient was prepped and draped in usual sterile fashion and imaging obtained to evaluate for foreign bodies Exploration:    Limited defect created (wound extended): no     Hemostasis achieved with:  Direct pressure   Imaging obtained: x-ray     Imaging outcome: foreign body not noted     Wound exploration: wound explored through full range of motion and entire depth of wound visualized     Wound extent: no foreign bodies/material noted, no nerve damage noted, no tendon damage noted, no underlying fracture noted and no vascular damage noted     Contaminated: no   Treatment:    Area cleansed with:  Povidone-iodine   Amount of cleaning:  Standard   Irrigation solution:  Sterile saline   Irrigation volume:  20 cc   Irrigation method:  Syringe   Visualized foreign bodies/material removed: no     Debridement:  None   Undermining:  None   Scar revision: no   Skin repair:    Repair method:  Sutures   Suture size:  4-0   Suture material:  Prolene   Suture technique:  Simple interrupted   Number of sutures:  4 Approximation:    Approximation:  Close Repair type:    Repair type:  Simple Post-procedure details:    Dressing:  Non-adherent dressing   Procedure completion:  Tolerated well, no immediate complications   Medications Ordered in ED Medications  LORazepam (ATIVAN) tablet 2 mg (has no administration in time range)  Tdap (BOOSTRIX) injection 0.5 mL (has no administration in  time range)  lidocaine (PF) (XYLOCAINE) 1 % injection 5 mL (has no administration in time range)    ED Course  I have reviewed the triage vital signs and the nursing notes.  Pertinent labs & imaging results that were available during my care of the patient were reviewed by me and considered in my medical decision making (see chart for details).    MDM Rules/Calculators/A&P                           DAK SZUMSKI presents with alcohol use, possible withdrawal, and a right hand injury.  As far as his injury, this was repaired with sutures.  [name] was given instructions on return precautions and wound care.  Suture removal instructions were also given.  [name] is to follow-up in 2 weeks.  [name] was given p.o. Ativan for his withdrawal symptoms and advised to seek supervision for discontinuing alcohol use.  I asked him whether or not [name] felt [name] would benefit from inpatient treatment, and [name] declined.  [name] is well-established with family services.  [name] was also given further inpatient and outpatient resources at discharge.  I emphasized the fact that supervised discontinuation of alcohol is the most effective and safe way to stop drinking. Final Clinical Impression(s) / ED Diagnoses Final diagnoses:  Alcohol dependence with uncomplicated withdrawal (HCC)  Laceration of right thumb without foreign body without damage to nail, initial encounter    Rx / DC Orders ED Discharge Orders     None        Koleen Distance, MD 06/29/21 [id number]

## 2021-06-30 ENCOUNTER — Other Ambulatory Visit: Payer: Self-pay

## 2021-06-30 ENCOUNTER — Emergency Department (HOSPITAL_COMMUNITY): Admission: EM | Admit: 2021-06-30 | Discharge: 2021-06-30 | Discharge status: Against Medical Advice | Payer: Self-pay

## 2021-08-03 ENCOUNTER — Emergency Department (HOSPITAL_COMMUNITY)
Admission: EM | Admit: 2021-08-03 | Discharge: 2021-08-03 | Disposition: A | Discharge status: Home / Self Care | Payer: Self-pay | Attending: Emergency Medicine | Admitting: Emergency Medicine

## 2021-08-03 ENCOUNTER — Other Ambulatory Visit: Payer: Self-pay

## 2021-08-03 DIAGNOSIS — F10239 Alcohol dependence with withdrawal, unspecified: Secondary | ICD-10-CM | POA: Insufficient documentation

## 2021-08-03 DIAGNOSIS — F17290 Nicotine dependence, other tobacco product, uncomplicated: Secondary | ICD-10-CM | POA: Insufficient documentation

## 2021-08-03 DIAGNOSIS — F10230 Alcohol dependence with withdrawal, uncomplicated: Secondary | ICD-10-CM

## 2021-08-03 DIAGNOSIS — Z8616 Personal history of COVID-19: Secondary | ICD-10-CM | POA: Insufficient documentation

## 2021-08-03 DIAGNOSIS — F10930 Alcohol use, unspecified with withdrawal, uncomplicated: Secondary | ICD-10-CM

## 2021-08-03 MED ORDER — LORAZEPAM 2 MG/ML IJ SOLN
1.0000 mg | Freq: Once | INTRAMUSCULAR | Status: DC
  Filled 2021-08-03: qty 1

## 2021-08-03 MED ORDER — LACTATED RINGERS IV BOLUS: 1000.0000 mL | Freq: Once | INTRAVENOUS | Status: DC

## 2021-08-03 MED ORDER — ONDANSETRON 8 MG PO TBDP
8.0000 mg | ORAL_TABLET | Freq: Once | ORAL | Status: AC
  Administered 2021-08-03: 8 mg via ORAL
  Filled 2021-08-03: qty 1

## 2021-08-03 MED ORDER — LACTATED RINGERS IV SOLN: INTRAVENOUS | Status: DC

## 2021-08-03 MED ORDER — CHLORDIAZEPOXIDE HCL 25 MG PO CAPS: ORAL_CAPSULE | ORAL | 0 refills | Status: DC

## 2021-08-03 MED ORDER — LORAZEPAM 1 MG PO TABS
2.0000 mg | ORAL_TABLET | Freq: Once | ORAL | Status: AC
  Administered 2021-08-03: 2 mg via ORAL
  Filled 2021-08-03: qty 2

## 2021-08-03 NOTE — ED Provider Notes (Signed)
[facility]ER[facility]PT Provider Note   CSN: [id number] Arrival date & time: 08/03/21  0854     History Chief Complaint  Patient presents with   Withdrawal    [name] is a 30 y.o. male.  30 year old male presents with request for help with alcohol withdrawal.  Patient states his last drink was at 11 PM last night.  Normally drinks between 8-10 beers a day.  Denies any SI or HI.  States he has had a lot of stress recently.  Contacted his therapist who is arranging for him to have outpatient treatment for his alcohol addiction.  States he wants to stop drinking at this time.  Has had nausea but no vomiting he denies abdominal pain no black or bloody stools.      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     Family History  Problem Relation Age of Onset   Lupus Mother    Hypertension Father     Social History   Tobacco Use   Smoking status: Every Day    Types: E-cigarettes   Smokeless tobacco: Never  Vaping Use   Vaping Use: Every day   Substances: Nicotine, Flavoring  Substance Use Topics   Alcohol use: Yes    Alcohol/week: 70.0 standard drinks    Types: 70 Standard drinks or equivalent per week   Drug use: Not Currently    Types: Benzodiazepines, Cocaine    Home Medications Prior to Admission medications   Medication Sig Start Date End Date Taking? Authorizing Provider  chlordiazePOXIDE (LIBRIUM) 25 MG capsule 50mg  PO TID x 1D, then 25-50mg  PO BID X 1D, then 25-50mg  PO QD X 1D 06/07/21   Upstill, Shari, PA-C  mirtazapine (REMERON) 15 MG tablet Take 1 tablet (15 mg total) by mouth at bedtime. 04/23/20    Money, 04/25/20, FNP  venlafaxine XR (EFFEXOR-XR) 150 MG 24 hr capsule Take 1 capsule (150 mg total) by mouth daily with breakfast. 04/24/20   Money, 04/26/20, FNP    Allergies    Patient has no known allergies.  Review of Systems   Review of Systems  All other systems reviewed and are negative.  Physical Exam Updated Vital Signs BP (!) 157/114   Pulse (!) 115   Temp 98.4 F (36.9 C) (Oral)   Resp 18   Ht 1.854 m (6\' 1" )   Wt 84 kg   SpO2 100%   BMI 24.43 kg/m   Physical Exam Vitals and nursing note reviewed.  Constitutional:      General: He is not in acute distress.    Appearance: Normal appearance. He is well-developed. He is not toxic-appearing.  HENT:     Head: Normocephalic and atraumatic.  Eyes:     General: Lids are normal.     Conjunctiva/sclera: Conjunctivae normal.     Pupils: Pupils are equal, round, and reactive to light.  Neck:     Thyroid: No thyroid mass.     Trachea: No tracheal deviation.  Cardiovascular:     Rate and Rhythm: Normal rate and regular rhythm.     Heart sounds: Normal heart sounds. No murmur heard.  No gallop.  Pulmonary:     Effort: Pulmonary effort is normal. No respiratory distress.     Breath sounds: Normal breath sounds. No stridor. No decreased breath sounds, wheezing, rhonchi or rales.  Abdominal:     General: There is no distension.     Palpations: Abdomen is soft.     Tenderness: There is no abdominal tenderness. There is no rebound.  Musculoskeletal:        General: No tenderness. Normal range of motion.     Cervical back: Normal range of motion and neck supple.  Skin:    General: Skin is warm and dry.     Findings: No abrasion or rash.  Neurological:     Mental Status: He is alert and oriented to person, place, and time. Mental status is at baseline.     GCS: GCS eye subscore is 4. GCS verbal subscore is 5. GCS motor subscore is 6.     Cranial Nerves: Cranial nerves are intact. No cranial nerve deficit.     Sensory:  No sensory deficit.     Motor: Motor function is intact.  Psychiatric:        Attention and Perception: Attention normal.        Speech: Speech normal.        Behavior: Behavior normal.        Thought Content: Thought content does not include suicidal ideation. Thought content does not include suicidal plan.    ED Results / Procedures / Treatments   Labs (all labs ordered are listed, but only abnormal results are displayed) Labs Reviewed - No data to display  EKG None  Radiology No results found.  Procedures Procedures   Medications Ordered in ED Medications  LORazepam (ATIVAN) tablet 2 mg (has no administration in time range)  ondansetron (ZOFRAN-ODT) disintegrating tablet 8 mg (has no administration in time range)    ED Course  I have reviewed the triage vital signs and the nursing notes.  Pertinent labs & imaging results that were available during my care of the patient were reviewed by me and considered in my medical decision making (see chart for details).    MDM Rules/Calculators/A&P                           Patient has a CIWA score of 8.  Was given Ativan 2 mg and monitored and feels better.  Heart rate decreased.  Blood pressure improved.  Will place on Librium taper.  He is going to follow-up for outpatient alcohol treatment Final Clinical Impression(s) / ED Diagnoses Final diagnoses:  None    Rx / DC Orders ED Discharge Orders     None        Lorre Nick, MD 08/03/21 1126

## 2021-08-03 NOTE — ED Triage Notes (Signed)
Pt c/o alcohol withdrawal, with the last drink being 10 hours ago. Pt states that he is experiencing, increased irritability, and forgetfulness since his last drink. Pt drinks 8-10 beers a day.

## 2021-08-24 ENCOUNTER — Other Ambulatory Visit: Payer: Self-pay

## 2021-08-24 ENCOUNTER — Encounter (HOSPITAL_COMMUNITY): Payer: Self-pay

## 2021-08-24 ENCOUNTER — Emergency Department (HOSPITAL_COMMUNITY)
Admission: EM | Admit: 2021-08-24 | Discharge: 2021-08-24 | Disposition: A | Discharge status: Home / Self Care | Payer: Self-pay | Attending: Emergency Medicine | Admitting: Emergency Medicine

## 2021-08-24 DIAGNOSIS — Z760 Encounter for issue of repeat prescription: Secondary | ICD-10-CM | POA: Insufficient documentation

## 2021-08-24 DIAGNOSIS — F10930 Alcohol use, unspecified with withdrawal, uncomplicated: Secondary | ICD-10-CM

## 2021-08-24 DIAGNOSIS — F10239 Alcohol dependence with withdrawal, unspecified: Secondary | ICD-10-CM | POA: Insufficient documentation

## 2021-08-24 DIAGNOSIS — F17290 Nicotine dependence, other tobacco product, uncomplicated: Secondary | ICD-10-CM | POA: Insufficient documentation

## 2021-08-24 DIAGNOSIS — R251 Tremor, unspecified: Secondary | ICD-10-CM | POA: Insufficient documentation

## 2021-08-24 DIAGNOSIS — Z8616 Personal history of COVID-19: Secondary | ICD-10-CM | POA: Insufficient documentation

## 2021-08-24 DIAGNOSIS — F10230 Alcohol dependence with withdrawal, uncomplicated: Secondary | ICD-10-CM

## 2021-08-24 MED ORDER — CHLORDIAZEPOXIDE HCL 25 MG PO CAPS: ORAL_CAPSULE | ORAL | 0 refills | Status: DC

## 2021-08-24 MED ORDER — MIRTAZAPINE 15 MG PO TABS: 15.0000 mg | ORAL_TABLET | Freq: Every day | ORAL | 0 refills | Status: DC

## 2021-08-24 MED ORDER — CHLORDIAZEPOXIDE HCL 25 MG PO CAPS
50.0000 mg | ORAL_CAPSULE | Freq: Once | ORAL | Status: AC
  Administered 2021-08-24: 50 mg via ORAL
  Filled 2021-08-24: qty 2

## 2021-08-24 MED ORDER — VENLAFAXINE HCL ER 150 MG PO CP24: 150.0000 mg | ORAL_CAPSULE | Freq: Every day | ORAL | 0 refills | Status: DC

## 2021-08-24 NOTE — ED Triage Notes (Signed)
Patient reports that he is withdrawing from alcohol. Patient reports that he last drank 12 hours ago and had 14 beers at that time.  Patient also reports that his therapist dismissed him from her practice due to a missed appointment. Patient states he is out of medication. (150 mg Effexor and 30 mg Remeron)  Patient denies SI/HI. Visual or auditory hallucinations.

## 2021-08-24 NOTE — ED Provider Notes (Signed)
[facility]-EMERGENCY DEPT Provider Note   CSN: [id number] Arrival date & time: 08/24/21  0915     History Chief Complaint  Patient presents with   Withdrawal   Medication Refill    [name] is a 30 y.o. male.  HPI    30 year old male comes in with chief complaint of alcohol withdrawal and medication refill.  Patient does not have any SI, HI.  He admits to depression and reports that he went to his psychiatrist for refill few days back and was informed that he was dismissed from the practice.  After that, over the last 4 days he has been drinking large amount of alcohol.  His last alcoholic beverage was last night.  He does not want to drink any further, but is having withdrawal-like symptoms.   Past Medical History:  Diagnosis Date   Alcohol dependence (HCC)    COVID-19    Suicidal behavior with attempted self-injury [facility])     Patient Active Problem List   Diagnosis Date Noted   MDD (major depressive disorder), recurrent episode, severe (HCC) 04/18/2020   Alcohol withdrawal, with perceptual disturbance (HCC) 09/15/2018    History reviewed. No pertinent surgical history.     Family History  Problem Relation Age of Onset   Lupus Mother    Hypertension Father     Social History   Tobacco Use   Smoking status: Every Day    Types: E-cigarettes   Smokeless tobacco: Never  Vaping Use   Vaping Use: Every day   Substances: Nicotine, Flavoring  Substance Use Topics   Alcohol use: Yes    Alcohol/week: 70.0 standard drinks    Types: 70 Standard drinks or equivalent per week   Drug use: Not Currently    Types: Benzodiazepines, Cocaine    Home Medications Prior to Admission medications   Medication Sig Start Date End Date Taking? Authorizing Provider  chlordiazePOXIDE (LIBRIUM) 25 MG capsule 50mg  PO TID x 1D, then 25-50mg  PO BID X 1D, then 25-50mg  PO QD X 1D 08/24/21   08/26/21, MD  mirtazapine (REMERON) 15 MG tablet Take 1  tablet (15 mg total) by mouth at bedtime. 08/24/21   08/26/21, MD  venlafaxine XR (EFFEXOR-XR) 150 MG 24 hr capsule Take 1 capsule (150 mg total) by mouth daily with breakfast. 08/24/21   08/26/21, MD    Allergies    Patient has no known allergies.  Review of Systems   Review of Systems  Constitutional:  Positive for activity change.  Gastrointestinal:  Negative for abdominal pain, nausea and vomiting.  Neurological:  Positive for tremors.  Psychiatric/Behavioral:  Negative for self-injury and suicidal ideas.    Physical Exam Updated Vital Signs BP (!) 160/111   Pulse (!) 102   Temp 98.1 F (36.7 C) (Oral)   Resp 18   Ht 6\' 1"  (1.854 m)   Wt 83.9 kg   SpO2 100%   BMI 24.41 kg/m   Physical Exam Vitals and nursing note reviewed.  Constitutional:      Appearance: He is well-developed.  HENT:     Head: Atraumatic.  Cardiovascular:     Rate and Rhythm: Normal rate.  Pulmonary:     Effort: Pulmonary effort is normal.  Musculoskeletal:     Cervical back: Neck supple.  Skin:    General: Skin is warm.  Neurological:     Mental Status: He is alert and oriented to person, place, and time.     Comments: Bilateral  upper extremity tremor  Psychiatric:        Mood and Affect: Mood normal.        Behavior: Behavior normal.        Thought Content: Thought content normal.        Judgment: Judgment normal.    ED Results / Procedures / Treatments   Labs (all labs ordered are listed, but only abnormal results are displayed) Labs Reviewed - No data to display  EKG None  Radiology No results found.  Procedures Procedures   Medications Ordered in ED Medications  chlordiazePOXIDE (LIBRIUM) capsule 50 mg (50 mg Oral Given 08/24/21 1204)    ED Course  I have reviewed the triage vital signs and the nursing notes.  Pertinent labs & imaging results that were available during my care of the patient were reviewed by me and considered in my medical decision making  (see chart for details).    MDM Rules/Calculators/A&P                           30 year old male with history of depression comes in with chief complaint of alcohol withdrawal symptoms.  He is having tremors and reports feeling restless.  He also has not taken his depression medications over the last week.  He is requesting refill of his Effexor and Remeron.  States that the current binge of alcohol is because his practice dismissed him.  No SI, HI.  We will give him some Librium right now, discharge him with Librium taper along with 1 month worth of Effexor and Remeron.  He has been advised to follow-up with [facility] to establish primary care and counseling.  Strict ER return precautions have been discussed, and patient is agreeing with the plan and is comfortable with the workup done and the recommendations from the ER.   Final Clinical Impression(s) / ED Diagnoses Final diagnoses:  Alcohol withdrawal syndrome without complication (HCC)  Medication refill    Rx / DC Orders ED Discharge Orders          Ordered    mirtazapine (REMERON) 15 MG tablet  Daily at bedtime        08/24/21 1205    venlafaxine XR (EFFEXOR-XR) 150 MG 24 hr capsule  Daily with breakfast        08/24/21 1205    chlordiazePOXIDE (LIBRIUM) 25 MG capsule        08/24/21 1205             Derwood Kaplan, MD 08/24/21 1415

## 2021-08-24 NOTE — Discharge Instructions (Addendum)
You are seen in the ER for medication refill and alcohol withdrawal.  Take the medication prescribed.  Please follow-up with behavioral health urgent care to ensure that you have continue to of care and prescription refills.

## 2021-09-07 ENCOUNTER — Other Ambulatory Visit: Payer: Self-pay

## 2021-09-07 ENCOUNTER — Encounter (HOSPITAL_COMMUNITY): Payer: Self-pay | Admitting: *Deleted

## 2021-09-07 ENCOUNTER — Ambulatory Visit (INDEPENDENT_AMBULATORY_CARE_PROVIDER_SITE_OTHER): Payer: No Payment, Other | Admitting: Psychiatry

## 2021-09-07 ENCOUNTER — Encounter (HOSPITAL_COMMUNITY): Payer: Self-pay | Admitting: Psychiatry

## 2021-09-07 VITALS — BP 135/97 | HR 72 | Ht 73.0 in | Wt 190.0 lb

## 2021-09-07 DIAGNOSIS — F411 Generalized anxiety disorder: Secondary | ICD-10-CM | POA: Diagnosis not present

## 2021-09-07 DIAGNOSIS — F32A Depression, unspecified: Secondary | ICD-10-CM

## 2021-09-07 MED ORDER — VENLAFAXINE HCL ER 150 MG PO CP24
150.0000 mg | ORAL_CAPSULE | Freq: Every day | ORAL | 3 refills | Status: DC
  Filled 2021-09-07: qty 30, 30d supply, fill #0
  Filled 2021-10-09: qty 30, 30d supply, fill #1
  Filled 2021-11-10: qty 30, 30d supply, fill #2
  Filled 2021-12-08: qty 30, 30d supply, fill #3

## 2021-09-07 MED ORDER — MIRTAZAPINE 30 MG PO TABS
30.0000 mg | ORAL_TABLET | Freq: Every day | ORAL | 3 refills | Status: DC
  Filled 2021-09-07: qty 30, 30d supply, fill #0
  Filled 2021-10-09: qty 30, 30d supply, fill #1
  Filled 2021-11-10: qty 30, 30d supply, fill #2
  Filled 2021-12-08: qty 30, 30d supply, fill #3
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
## 2021-10-08 ENCOUNTER — Ambulatory Visit (HOSPITAL_COMMUNITY): Payer: Self-pay | Admitting: Psychiatry

## 2021-10-09 ENCOUNTER — Other Ambulatory Visit: Payer: Self-pay

## 2021-10-27 ENCOUNTER — Ambulatory Visit (INDEPENDENT_AMBULATORY_CARE_PROVIDER_SITE_OTHER): Payer: No Payment, Other | Admitting: Clinical

## 2021-10-27 ENCOUNTER — Other Ambulatory Visit: Payer: Self-pay

## 2021-10-27 DIAGNOSIS — F411 Generalized anxiety disorder: Secondary | ICD-10-CM | POA: Diagnosis not present

## 2021-10-27 DIAGNOSIS — F32A Depression, unspecified: Secondary | ICD-10-CM
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
## 2021-11-04 ENCOUNTER — Encounter (HOSPITAL_COMMUNITY): Payer: Self-pay

## 2021-11-04 ENCOUNTER — Emergency Department (HOSPITAL_COMMUNITY)
Admission: EM | Admit: 2021-11-04 | Discharge: 2021-11-04 | Disposition: A | Discharge status: Home / Self Care | Payer: 59 | Attending: Emergency Medicine | Admitting: Emergency Medicine

## 2021-11-04 ENCOUNTER — Other Ambulatory Visit: Payer: Self-pay

## 2021-11-04 DIAGNOSIS — F17290 Nicotine dependence, other tobacco product, uncomplicated: Secondary | ICD-10-CM | POA: Insufficient documentation

## 2021-11-04 DIAGNOSIS — Y902 Blood alcohol level of 40-59 mg/100 ml: Secondary | ICD-10-CM | POA: Diagnosis not present

## 2021-11-04 DIAGNOSIS — Z8616 Personal history of COVID-19: Secondary | ICD-10-CM | POA: Insufficient documentation

## 2021-11-04 DIAGNOSIS — Z79899 Other long term (current) drug therapy: Secondary | ICD-10-CM | POA: Insufficient documentation

## 2021-11-04 DIAGNOSIS — F10239 Alcohol dependence with withdrawal, unspecified: Secondary | ICD-10-CM | POA: Insufficient documentation

## 2021-11-04 DIAGNOSIS — F10930 Alcohol use, unspecified with withdrawal, uncomplicated: Secondary | ICD-10-CM

## 2021-11-04 LAB — COMPREHENSIVE METABOLIC PANEL
ALT: 21 U/L (ref 0–44)
AST: 26 U/L (ref 15–41)
Albumin: 4.4 g/dL (ref 3.5–5.0)
Alkaline Phosphatase: 49 U/L (ref 38–126)
Anion gap: 8 (ref 5–15)
BUN: 15 mg/dL (ref 6–20)
CO2: 25 mmol/L (ref 22–32)
Calcium: 9.2 mg/dL (ref 8.9–10.3)
Chloride: 104 mmol/L (ref 98–111)
Creatinine, Ser: 0.98 mg/dL (ref 0.61–1.24)
GFR, Estimated: >60 mL/min (ref >60)
Glucose, Bld: 90 mg/dL (ref 70–99)
Potassium: 3.7 mmol/L (ref 3.5–5.1)
Sodium: 137 mmol/L (ref 135–145)
Total Bilirubin: 1.1 mg/dL (ref 0.3–1.2)
Total Protein: 8 g/dL (ref 6.5–8.1)

## 2021-11-04 LAB — CBC WITH DIFFERENTIAL/PLATELET
Abs Immature Granulocytes: 0.02 10*3/uL (ref 0.00–0.07)
Basophils Absolute: 0 10*3/uL (ref 0.0–0.1)
Basophils Relative: 1 %
Eosinophils Absolute: 0.1 10*3/uL (ref 0.0–0.5)
Eosinophils Relative: 3 %
HCT: 42.5 % (ref 39.0–52.0)
Hemoglobin: 14.3 g/dL (ref 13.0–17.0)
Immature Granulocytes: 0 %
Lymphocytes Relative: 34 %
Lymphs Abs: 1.6 10*3/uL (ref 0.7–4.0)
MCH: 29.1 pg (ref 26.0–34.0)
MCHC: 33.6 g/dL (ref 30.0–36.0)
MCV: 86.6 fL (ref 80.0–100.0)
Monocytes Absolute: 0.5 10*3/uL (ref 0.1–1.0)
Monocytes Relative: 11 %
Neutro Abs: 2.4 10*3/uL (ref 1.7–7.7)
Neutrophils Relative %: 51 %
Platelets: 178 10*3/uL (ref 150–400)
RBC: 4.91 MIL/uL (ref 4.22–5.81)
RDW: 12.4 % (ref 11.5–15.5)
WBC: 4.7 10*3/uL (ref 4.0–10.5)
nRBC: 0 % (ref 0.0–0.2)

## 2021-11-04 LAB — ETHANOL: Alcohol, Ethyl (B): 48 mg/dL — ABNORMAL HIGH (ref <10)

## 2021-11-04 MED ORDER — CHLORDIAZEPOXIDE HCL 25 MG PO CAPS: ORAL_CAPSULE | ORAL | 0 refills | Status: DC

## 2021-11-04 MED ORDER — LORAZEPAM 2 MG/ML IJ SOLN
1.0000 mg | Freq: Once | INTRAMUSCULAR | Status: AC
  Administered 2021-11-04: 1 mg via INTRAVENOUS
  Filled 2021-11-04: qty 1

## 2021-11-04 MED ORDER — LACTATED RINGERS IV BOLUS
2000.0000 mL | Freq: Once | INTRAVENOUS | Status: AC
  Administered 2021-11-04: 2000 mL via INTRAVENOUS

## 2021-11-04 MED ORDER — THIAMINE HCL 100 MG/ML IJ SOLN
100.0000 mg | Freq: Once | INTRAMUSCULAR | Status: AC
  Administered 2021-11-04: 100 mg via INTRAVENOUS
  Filled 2021-11-04: qty 2

## 2021-11-04 NOTE — Discharge Instructions (Signed)
Take the medication to help with your symptoms.  Follow-up with treatment program as planned

## 2021-11-04 NOTE — ED Provider Notes (Signed)
[facility]-EMERGENCY DEPT Provider Note   CSN: [id number] Arrival date & time: 11/04/21  0973     History Chief Complaint  Patient presents with   Withdrawal    [name] is a 30 y.o. male.  HPI  Patient has a history of alcohol use disorder.  Patient states he had been in recovery but had some stressful events and relapsed on Thursday.  Patient states he has been drinking alcohol essentially since then until last night.  Patient knows he needs to stop drinking his last drink was about 9 hours ago but he is starting to feel shaky and nauseous and is developing a headache.  Patient has had trouble withdrawal in the past but has never had trouble with seizures or delirium tremens. 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 No pertinent surgical history.     Family History  Problem Relation Age of Onset   Lupus Mother    Hypertension Father     Social History   Tobacco Use   Smoking status: Every Day    Types: E-cigarettes   Smokeless tobacco: Never  Vaping Use   Vaping Use: Every day   Substances: Nicotine, Flavoring  Substance Use Topics   Alcohol use: Yes    Alcohol/week: 70.0 standard drinks    Types: 70 Standard drinks or equivalent per week   Drug use: Not Currently    Types: Benzodiazepines, Cocaine    Home Medications Prior to Admission medications   Medication Sig Start Date End Date Taking? Authorizing Provider  chlordiazePOXIDE (LIBRIUM) 25 MG capsule 50mg  PO TID x 1D, then 25-50mg  PO BID X 1D,  then 25-50mg  PO QD X 1D 11/04/21  Yes 11/06/21, MD  mirtazapine (REMERON) 30 MG tablet Take 1 tablet (30 mg total) by mouth at bedtime. 09/07/21   11/07/21, NP  venlafaxine XR (EFFEXOR-XR) 150 MG 24 hr capsule Take 1 capsule (150 mg total) by mouth daily with breakfast. 09/07/21   11/07/21, NP    Allergies    Benadryl [diphenhydramine]  Review of Systems   Review of Systems  All other systems reviewed and are negative.  Physical Exam Updated Vital Signs BP (!) 157/106   Pulse 96   Temp 97.9 F (36.6 C) (Oral)   Resp 16   SpO2 100%   Physical Exam Vitals and nursing note reviewed.  Constitutional:      General: He is not in acute distress.    Appearance: He is well-developed.  HENT:     Head: Normocephalic and atraumatic.     Right Ear: External ear normal.     Left Ear: External ear normal.  Eyes:     General: No scleral icterus.       Right eye: No discharge.        Left eye: No discharge.     Conjunctiva/sclera: Conjunctivae normal.  Neck:     Trachea: No tracheal deviation.  Cardiovascular:     Rate and Rhythm: Normal rate and regular rhythm.  Pulmonary:  Effort: Pulmonary effort is normal. No respiratory distress.     Breath sounds: Normal breath sounds. No stridor. No wheezing or rales.  Abdominal:     General: Bowel sounds are normal. There is no distension.     Palpations: Abdomen is soft.     Tenderness: There is no abdominal tenderness. There is no guarding or rebound.  Musculoskeletal:        General: No tenderness or deformity.     Cervical back: Neck supple.  Skin:    General: Skin is warm and dry.     Findings: No rash.  Neurological:     General: No focal deficit present.     Mental Status: He is alert.     Cranial Nerves: No cranial nerve deficit (no facial droop, extraocular movements intact, no slurred speech).     Sensory: No sensory deficit.     Motor: Tremor present. No abnormal muscle tone or seizure activity.      Coordination: Coordination normal.  Psychiatric:        Mood and Affect: Mood normal.    ED Results / Procedures / Treatments   Labs (all labs ordered are listed, but only abnormal results are displayed) Labs Reviewed  ETHANOL - Abnormal; Notable for the following components:      Result Value   Alcohol, Ethyl (B) 48 (*)    All other components within normal limits  COMPREHENSIVE METABOLIC PANEL  CBC WITH DIFFERENTIAL/PLATELET    EKG None  Radiology No results found.  Procedures Procedures   Medications Ordered in ED Medications  lactated ringers bolus 2,000 mL (0 mLs Intravenous Stopped 11/04/21 1254)  LORazepam (ATIVAN) injection 1 mg (1 mg Intravenous Given 11/04/21 1036)  thiamine (B-1) injection 100 mg (100 mg Intravenous Given 11/04/21 1035)    ED Course  I have reviewed the triage vital signs and the nursing notes.  Pertinent labs & imaging results that were available during my care of the patient were reviewed by me and considered in my medical decision making (see chart for details).  Clinical Course as of 11/04/21 1759  Wed Nov 04, 2021  1205 Labs reviewed.  CBC metabolic panel unremarkable.  Alcohol level slightly elevated at 48 [JK]  1205 Vital signs show mild hypertension but no tachycardia [JK]    Clinical Course User Index [JK] Linwood Dibbles, MD   MDM Rules/Calculators/A&P                           Presented with complaints of alcohol withdrawal.  History of similar sx in the past.  No prior DTs, seizures.  Responded well to benzodiazepines.  Feeling better and ready for dc with librium taper.  Pt has outpt resources Final Clinical Impression(s) / ED Diagnoses Final diagnoses:  Alcohol withdrawal syndrome without complication (HCC)    Rx / DC Orders ED Discharge Orders          Ordered    chlordiazePOXIDE (LIBRIUM) 25 MG capsule        11/04/21 1238             Linwood Dibbles, MD 11/04/21 1759

## 2021-11-04 NOTE — ED Triage Notes (Signed)
Patient presents with withdrawal symptoms from alcohol. He reports having tremors, nausea, and headache. His last drink was 9 hours ago.

## 2021-11-09 ENCOUNTER — Ambulatory Visit (HOSPITAL_COMMUNITY): Payer: Self-pay

## 2021-11-10 ENCOUNTER — Other Ambulatory Visit: Payer: Self-pay

## 2021-12-08 ENCOUNTER — Other Ambulatory Visit: Payer: Self-pay

## 2021-12-08 ENCOUNTER — Telehealth (HOSPITAL_COMMUNITY): Payer: Commercial Managed Care - HMO | Admitting: Psychiatry

## 2021-12-08 ENCOUNTER — Encounter (HOSPITAL_COMMUNITY): Payer: Self-pay

## 2021-12-08 ENCOUNTER — Telehealth (HOSPITAL_COMMUNITY): Payer: Self-pay | Admitting: Psychiatry

## 2021-12-08 ENCOUNTER — Other Ambulatory Visit (HOSPITAL_COMMUNITY): Payer: Self-pay | Admitting: Psychiatry

## 2021-12-08 DIAGNOSIS — F32A Depression, unspecified: Secondary | ICD-10-CM

## 2021-12-08 DIAGNOSIS — F411 Generalized anxiety disorder: Secondary | ICD-10-CM

## 2021-12-08 MED ORDER — VENLAFAXINE HCL ER 150 MG PO CP24: 150.0000 mg | ORAL_CAPSULE | Freq: Every day | ORAL | 3 refills | Status: DC

## 2021-12-08 MED ORDER — VENLAFAXINE HCL ER 150 MG PO CP24
150.0000 mg | ORAL_CAPSULE | Freq: Every day | ORAL | 3 refills | Status: DC
  Filled 2021-12-08 – 2022-01-18 (×3): qty 30, 30d supply, fill #0
  Filled 2022-02-17: qty 30, 30d supply, fill #1
  Filled 2022-03-19: qty 30, 30d supply, fill #2

## 2021-12-08 MED ORDER — MIRTAZAPINE 30 MG PO TABS
30.0000 mg | ORAL_TABLET | Freq: Every day | ORAL | 2 refills | Status: DC
  Filled 2021-12-08 – 2021-12-21 (×2): qty 30, 30d supply, fill #0
  Filled 2022-01-18: qty 30, 30d supply, fill #1
  Filled 2022-02-17: qty 30, 30d supply, fill #2

## 2021-12-08 MED ORDER — VENLAFAXINE HCL ER 37.5 MG PO CP24: 37.5000 mg | ORAL_CAPSULE | Freq: Every day | ORAL | 3 refills | Status: DC

## 2021-12-08 MED ORDER — NALTREXONE HCL 50 MG PO TABS: 50.0000 mg | ORAL_TABLET | Freq: Every day | ORAL | 3 refills | Status: DC

## 2021-12-08 MED ORDER — NALTREXONE HCL 50 MG PO TABS
50.0000 mg | ORAL_TABLET | Freq: Every day | ORAL | 3 refills | Status: DC
  Filled 2021-12-08: qty 30, 30d supply, fill #0

## 2021-12-08 NOTE — Telephone Encounter (Signed)
Patient contacted office to cancel 330 appt due to work. States he has one refill left, but will need more meds to last until next available on 02/12/22

## 2021-12-08 NOTE — Telephone Encounter (Signed)
Patient was seen at [facility] health on 11/09/2021 for Alcohol withdrawal  and was discharged on Naltrexone 50 mg daily, Effexor  37.5, and Valium 10 mg daily which was not refilled as it was tapered to 5 mg and then discontinued (per patient). Patient notes that he has been taking Effexor 150 mg, Mirtazapine 30 mg, and notes that he has not picked up Naltrexone as it was expensive. At this time Effexor 150 mg, Mirtazapine 40 mg,  and Naltrexone 50 mg  refilled and sent to [facility]. Patient requested Valium but at this time provider informed patient that Valium would not be refilled. No other concerns noted at this time.

## 2021-12-12 ENCOUNTER — Emergency Department (HOSPITAL_COMMUNITY)
Admission: EM | Admit: 2021-12-12 | Discharge: 2021-12-14 | Disposition: A | Discharge status: Home / Self Care | Payer: Self-pay | Attending: Emergency Medicine | Admitting: Emergency Medicine

## 2021-12-12 ENCOUNTER — Other Ambulatory Visit: Payer: Self-pay

## 2021-12-12 ENCOUNTER — Encounter (HOSPITAL_COMMUNITY): Payer: Self-pay | Admitting: Emergency Medicine

## 2021-12-12 DIAGNOSIS — F1024 Alcohol dependence with alcohol-induced mood disorder: Secondary | ICD-10-CM | POA: Diagnosis present

## 2021-12-12 DIAGNOSIS — Z20822 Contact with and (suspected) exposure to covid-19: Secondary | ICD-10-CM | POA: Insufficient documentation

## 2021-12-12 DIAGNOSIS — F32A Depression, unspecified: Secondary | ICD-10-CM

## 2021-12-12 DIAGNOSIS — T4391XA Poisoning by unspecified psychotropic drug, accidental (unintentional), initial encounter: Secondary | ICD-10-CM | POA: Insufficient documentation

## 2021-12-12 DIAGNOSIS — T50902A Poisoning by unspecified drugs, medicaments and biological substances, intentional self-harm, initial encounter: Secondary | ICD-10-CM

## 2021-12-12 LAB — RESP PANEL BY RT-PCR (FLU A&B, COVID) ARPGX2
Influenza A by PCR: NEGATIVE
Influenza B by PCR: NEGATIVE
SARS Coronavirus 2 by RT PCR: NEGATIVE

## 2021-12-12 LAB — COMPREHENSIVE METABOLIC PANEL
ALT: 16 U/L (ref 0–44)
AST: 22 U/L (ref 15–41)
Albumin: 4.2 g/dL (ref 3.5–5.0)
Alkaline Phosphatase: 49 U/L (ref 38–126)
Anion gap: 7 (ref 5–15)
BUN: 18 mg/dL (ref 6–20)
CO2: 27 mmol/L (ref 22–32)
Calcium: 8.5 mg/dL — ABNORMAL LOW (ref 8.9–10.3)
Chloride: 106 mmol/L (ref 98–111)
Creatinine, Ser: 0.86 mg/dL (ref 0.61–1.24)
GFR, Estimated: >60 mL/min (ref >60)
Glucose, Bld: 94 mg/dL (ref 70–99)
Potassium: 4.1 mmol/L (ref 3.5–5.1)
Sodium: 140 mmol/L (ref 135–145)
Total Bilirubin: 0.6 mg/dL (ref 0.3–1.2)
Total Protein: 7.7 g/dL (ref 6.5–8.1)

## 2021-12-12 LAB — CBC
HCT: 40.4 % (ref 39.0–52.0)
Hemoglobin: 13.3 g/dL (ref 13.0–17.0)
MCH: 29.4 pg (ref 26.0–34.0)
MCHC: 32.9 g/dL (ref 30.0–36.0)
MCV: 89.4 fL (ref 80.0–100.0)
Platelets: 177 10*3/uL (ref 150–400)
RBC: 4.52 MIL/uL (ref 4.22–5.81)
RDW: 13.8 % (ref 11.5–15.5)
WBC: 5.5 10*3/uL (ref 4.0–10.5)
nRBC: 0 % (ref 0.0–0.2)

## 2021-12-12 LAB — RAPID URINE DRUG SCREEN, HOSP PERFORMED
Amphetamines: NOT DETECTED
Barbiturates: NOT DETECTED
Benzodiazepines: POSITIVE — AB
Cocaine: NOT DETECTED
Opiates: NOT DETECTED
Tetrahydrocannabinol: NOT DETECTED

## 2021-12-12 LAB — SALICYLATE LEVEL: Salicylate Lvl: <7 mg/dL — ABNORMAL LOW (ref 7.0–30.0)

## 2021-12-12 LAB — ACETAMINOPHEN LEVEL
Acetaminophen (Tylenol), Serum: <10 ug/mL — ABNORMAL LOW (ref 10–30)
Acetaminophen (Tylenol), Serum: <10 ug/mL — ABNORMAL LOW (ref 10–30)

## 2021-12-12 LAB — ETHANOL: Alcohol, Ethyl (B): 267 mg/dL — ABNORMAL HIGH (ref <10)

## 2021-12-12 MED ORDER — LORAZEPAM 1 MG PO TABS
0.0000 mg | ORAL_TABLET | Freq: Four times a day (QID) | ORAL | Status: AC
  Administered 2021-12-12: 1 mg via ORAL
  Administered 2021-12-12 – 2021-12-13 (×2): 2 mg via ORAL
  Filled 2021-12-12 (×3): qty 2

## 2021-12-12 MED ORDER — THIAMINE HCL 100 MG PO TABS
100.0000 mg | ORAL_TABLET | Freq: Every day | ORAL | Status: DC
  Administered 2021-12-12 – 2021-12-14 (×2): 100 mg via ORAL
  Filled 2021-12-12 (×3): qty 1

## 2021-12-12 MED ORDER — LORAZEPAM 2 MG/ML IJ SOLN: 0.0000 mg | Freq: Two times a day (BID) | INTRAMUSCULAR | Status: DC

## 2021-12-12 MED ORDER — THIAMINE HCL 100 MG/ML IJ SOLN
100.0000 mg | Freq: Every day | INTRAMUSCULAR | Status: DC
  Administered 2021-12-13: 100 mg via INTRAVENOUS
  Filled 2021-12-12: qty 2

## 2021-12-12 MED ORDER — LORAZEPAM 2 MG/ML IJ SOLN
0.0000 mg | Freq: Four times a day (QID) | INTRAMUSCULAR | Status: AC
  Administered 2021-12-13 (×2): 2 mg via INTRAVENOUS
  Filled 2021-12-12 (×2): qty 1

## 2021-12-12 MED ORDER — LORAZEPAM 1 MG PO TABS: 0.0000 mg | ORAL_TABLET | Freq: Two times a day (BID) | ORAL | Status: DC

## 2021-12-12 MED ORDER — ALUM & MAG HYDROXIDE-SIMETH 200-200-20 MG/5ML PO SUSP: 30.0000 mL | Freq: Four times a day (QID) | ORAL | Status: DC | PRN

## 2021-12-12 MED ORDER — ONDANSETRON HCL 4 MG PO TABS: 4.0000 mg | ORAL_TABLET | Freq: Three times a day (TID) | ORAL | Status: DC | PRN

## 2021-12-12 MED ORDER — SODIUM CHLORIDE 0.9 % IV BOLUS
1000.0000 mL | Freq: Once | INTRAVENOUS | Status: AC
  Administered 2021-12-12: 1000 mL via INTRAVENOUS

## 2021-12-12 MED ORDER — NICOTINE 21 MG/24HR TD PT24
21.0000 mg | MEDICATED_PATCH | Freq: Every day | TRANSDERMAL | Status: DC
  Administered 2021-12-12 – 2021-12-14 (×3): 21 mg via TRANSDERMAL
  Filled 2021-12-12 (×3): qty 1

## 2021-12-12 MED ORDER — IBUPROFEN 200 MG PO TABS
600.0000 mg | ORAL_TABLET | Freq: Three times a day (TID) | ORAL | Status: DC | PRN
  Filled 2021-12-12: qty 3

## 2021-12-12 NOTE — ED Triage Notes (Signed)
Patient from home BIB EMS s/p OD on Remeron. Patient denies SI, but gf states yes. Patient arrested for a DUI 2 hours ago, blew a 0.22 with [employer], after which, he went home and took 30 - 30 mg Remeron. Patient ambulatory on scene. Patient denies symptoms at this time.   Per EMS, poison control contacted. Monitor for seizures and tachycardia, treated with benzos and fluids. Q. 4 hour tylenol.

## 2021-12-12 NOTE — ED Notes (Signed)
Patient stating he needed to use the restroom. Patient got out of bed and removed all of his equipment. Patient less than cooperative with verbal redirection. Patient assisted back into bed and equipment placed back on patient. Patient's clothing removed and patient placed in a gown.

## 2021-12-12 NOTE — ED Provider Notes (Signed)
Blood pressure 132/87, pulse 98, temperature 98.8 F (37.1 C), temperature source Oral, resp. rate 20, height 6\' 1"  (1.854 m), weight 83.9 kg, SpO2 100 %.  Assuming care from Dr. .  In short, [name] is a 31 y.o. male with a chief complaint of Drug Overdose .  Refer to the original H&P for additional details.  The current plan of care is to follow-up on patient after he is more awake and alert.  09:05 AM  Went to reevaluate the patient.  He is sleeping but arouses easily to voice.  He tells me that he is doing well with no symptoms. Moving all extremities. EtOH is elevated but appears to be clinically sobering. Repeat Tylenol level is < 10. Patient is medically clear for TTS evaluation.   02:30 PM  Patient is awake, alert, and sitting up in bed.    38, MD 12/14/21 [phone]

## 2021-12-12 NOTE — ED Notes (Signed)
Patient dress out into the scrubs.Patient cloths, 2 neckless  and cell phones are storage in the cabinet on the 19-22 section.

## 2021-12-12 NOTE — ED Notes (Signed)
Poison controlled nurse call and she was update on patient status and labs.

## 2021-12-12 NOTE — ED Provider Notes (Signed)
[facility]-EMER[facility]PT Provider Note   CSN: [id number] Arrival date & time: 12/12/21  0110     History  Chief Complaint  Patient presents with   Drug Overdose    [name] is a 31 y.o. male.  The history is provided by the patient and the EMS personnel.  Drug Overdose [name] is a 31 y.o. male who presents to the Emergency Department complaining of overdose.  He presents to the emergency department by EMS following overdose.  He was pulled over earlier in the evening for a DUI and when he returned home he took 30, 30 mg of Remeron.  Patient told EMS this was an attempt to sleep.  Significant other states that this was an attempt to kill himself.  Patient states that he did it to sleep.  Patient reports of feeling sleepy and woozy.  No additional symptoms.     Home Medications Prior to Admission medications   Medication Sig Start Date End Date Taking? Authorizing Provider  chlordiazePOXIDE (LIBRIUM) 25 MG capsule 50mg  PO TID x 1D, then 25-50mg  PO BID X 1D, then 25-50mg  PO QD X 1D 11/04/21   11/06/21, MD  mirtazapine (REMERON) 30 MG tablet Take 1 tablet (30 mg total) by mouth at bedtime. 12/08/21 12/08/22  02/06/23, NP  naltrexone (DEPADE) 50 MG tablet Take 1 tablet (50 mg total) by mouth daily. 12/08/21   02/05/22, NP  venlafaxine XR (EFFEXOR-XR) 150 MG 24 hr capsule Take 1 capsule (150 mg total) by mouth daily with breakfast. 12/08/21   02/05/22, NP  venlafaxine XR (EFFEXOR-XR) 37.5 MG 24 hr capsule Take 1 capsule (37.5 mg total) by mouth daily with breakfast. 12/08/21   02/05/22, NP      Allergies    Benadryl [diphenhydramine] and Codeine    Review of Systems   Review of Systems  All other systems reviewed and are negative.  Physical Exam Updated Vital Signs BP 132/87    Pulse 98    Temp 98.8 F (37.1 C) (Oral)    Resp 20    Ht 6\' 1"  (1.854 m)    Wt 83.9 kg    SpO2 100%    BMI 24.41 kg/m  Physical  Exam Vitals and nursing note reviewed.  Constitutional:      Appearance: He is well-developed.     Comments: Drowsy but conversant  HENT:     Head: Normocephalic and atraumatic.  Cardiovascular:     Rate and Rhythm: Normal rate and regular rhythm.     Heart sounds: No murmur heard. Pulmonary:     Effort: Pulmonary effort is normal. No respiratory distress.     Breath sounds: Normal breath sounds.  Abdominal:     Palpations: Abdomen is soft.     Tenderness: There is no abdominal tenderness. There is no guarding or rebound.  Musculoskeletal:        General: No tenderness.  Skin:    General: Skin is warm and dry.  Neurological:     Mental Status: He is oriented to person, place, and time.     Comments: Horizontal nystagmus, generalized weakness.  Pupils dilated and reactive bilaterally    ED Results / Procedures / Treatments   Labs (all labs ordered are listed, but only abnormal results are displayed) Labs Reviewed  COMPREHENSIVE METABOLIC PANEL - Abnormal; Notable for the following components:      Result Value   Calcium 8.5 (*)  All other components within normal limits  ETHANOL - Abnormal; Notable for the following components:   Alcohol, Ethyl (B) 267 (*)    All other components within normal limits  SALICYLATE LEVEL - Abnormal; Notable for the following components:   Salicylate Lvl <7.0 (*)    All other components within normal limits  ACETAMINOPHEN LEVEL - Abnormal; Notable for the following components:   Acetaminophen (Tylenol), Serum <10 (*)    All other components within normal limits  RAPID URINE DRUG SCREEN, HOSP PERFORMED - Abnormal; Notable for the following components:   Benzodiazepines POSITIVE (*)    All other components within normal limits  ACETAMINOPHEN LEVEL - Abnormal; Notable for the following components:   Acetaminophen (Tylenol), Serum <10 (*)    All other components within normal limits  CBC    EKG EKG Interpretation  Date/Time:  Saturday  December 12 2021 01:33:57 EST Ventricular Rate:  82 PR Interval:  135 QRS Duration: 91 QT Interval:  379 QTC Calculation: 443 R Axis:   67 Text Interpretation: Sinus rhythm RSR' in V1 or V2, probably normal variant ST elev, probable normal early repol pattern No previous ECGs available Confirmed by Tilden Fossa [phone]) on 12/12/2021 1:36:19 AM  Radiology No results found.  Procedures Procedures    Medications Ordered in ED Medications  sodium chloride 0.9 % bolus 1,000 mL (0 mLs Intravenous Stopped 12/12/21 0220)    ED Course/ Medical Decision Making/ A&P                           Medical Decision Making  Patient with history of alcohol abuse here for evaluation after overdosing on 30, 30 mg Remeron tablets.  Patient reports that this was not a suicide attempt but just an attempt to sleep.  Significant other told EMS that this was an attempt to self-harm and he wanted to kill himself.  Patient initially drowsy but conversant.  Shortly after ED arrival patient slept.  He was observed for several hours, resting comfortably, protecting his airway.  On reassessment at 07 100 patient continues to be sleepy.  He can be awoken but rapidly falls back asleep.  IVC was completed for patient's safety due to concern for attempt at self-harm.  Patient care transferred pending ongoing metabolization of his medications.       Final Clinical Impression(s) / ED Diagnoses Final diagnoses:  None    Rx / DC Orders ED Discharge Orders     None         Tilden Fossa, MD 12/12/21 [id number]

## 2021-12-13 DIAGNOSIS — F1024 Alcohol dependence with alcohol-induced mood disorder: Secondary | ICD-10-CM

## 2021-12-13 MED ORDER — VENLAFAXINE HCL ER 75 MG PO CP24: 150.0000 mg | ORAL_CAPSULE | Freq: Every day | ORAL | Status: DC

## 2021-12-13 MED ORDER — VENLAFAXINE HCL ER 75 MG PO CP24
150.0000 mg | ORAL_CAPSULE | Freq: Every day | ORAL | Status: DC
  Administered 2021-12-13 – 2021-12-14 (×2): 150 mg via ORAL
  Filled 2021-12-13 (×2): qty 2

## 2021-12-13 NOTE — ED Notes (Signed)
Pt has been calm and cooperative during entire shift.  Pt denies SI/HI and AVH.  Pt became tearful when informed inpatient placement had been recommended.  Pt stated, "I'm not suicidal, I took double dose of my sleeping medicine that's it."  Emotional support provided to pt.
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
## 2021-12-13 NOTE — ED Notes (Signed)
Patient is asleep.  

## 2021-12-13 NOTE — ED Provider Notes (Signed)
Emergency Medicine Observation Re-evaluation Note  [name] is a 31 y.o. male, seen on rounds today.  Pt initially presented to the ED for complaints of Drug Overdose Currently, the patient is resting comfortably.  Physical Exam  BP (!) 142/114    Pulse 78    Temp 98.8 F (37.1 C) (Oral)    Resp 18    Ht 6\' 1"  (1.854 m)    Wt 83.9 kg    SpO2 99%    BMI 24.41 kg/m  Physical Exam Vitals and nursing note reviewed.  Constitutional:      General: He is not in acute distress.    Appearance: He is well-developed.  HENT:     Head: Normocephalic and atraumatic.  Eyes:     Conjunctiva/sclera: Conjunctivae normal.  Cardiovascular:     Rate and Rhythm: Normal rate and regular rhythm.     Heart sounds: No murmur heard. Pulmonary:     Effort: Pulmonary effort is normal. No respiratory distress.  Musculoskeletal:        General: No swelling.     Cervical back: Neck supple.  Skin:    General: Skin is warm and dry.  Neurological:     Mental Status: He is alert.  Psychiatric:        Mood and Affect: Mood normal.    ED Course / MDM  EKG:EKG Interpretation  Date/Time:  Saturday December 12 2021 01:33:57 EST Ventricular Rate:  82 PR Interval:  135 QRS Duration: 91 QT Interval:  379 QTC Calculation: 443 R Axis:   67 Text Interpretation: Sinus rhythm RSR' in V1 or V2, probably normal variant ST elev, probable normal early repol pattern No previous ECGs available Confirmed by 12-25-2004 [phone]) on 12/12/2021 1:36:19 AM  I have reviewed the labs performed to date as well as medications administered while in observation.  Recent changes in the last 24 hours include metabolization of previous substance, pending psych eval.  Plan  Current plan is for TTS evaluation.  [name] is not under involuntary commitment.     [name], MD 12/13/21 [phone]

## 2021-12-13 NOTE — ED Notes (Signed)
TTS in progress 

## 2021-12-13 NOTE — BH Assessment (Signed)
Requested cart. 

## 2021-12-14 MED ORDER — IBUPROFEN 800 MG PO TABS
800.0000 mg | ORAL_TABLET | Freq: Once | ORAL | Status: AC
  Administered 2021-12-14: 800 mg via ORAL
  Filled 2021-12-14: qty 1

## 2021-12-14 NOTE — Discharge Instructions (Signed)
Please continue to take medications as directed. If your symptoms return, worsen, or persist please call your 911, report to local ER, or contact crisis hotline. Please do not drink alcohol or use any illegal substances while taking prescription medications.    Please be sure to keep your appointment at [facility] on December 17, 2021.

## 2021-12-14 NOTE — Consult Note (Signed)
[facility] Psych ED Discharge  12/14/2021 12:55 PM [name]  MRN:  [id number]  Method of visit?: Face to Face   Principal Problem: Alcohol dependence with alcohol-induced mood disorder [facility]) Discharge Diagnoses: Principal Problem:   Alcohol dependence with alcohol-induced mood disorder [facility])   Subjective: 31 year old male who presented to [facility] emergency department, under involuntary commitment at this time. He admits to taking 6 mirtazapine, and had a standing dose of 45 mg p.o. nightly.  Today, he is calm, cooperative, denies psychosis, delusions, suicidal/homicidal ideations, intent or plan. Will psych clear at this time. Patient appears to be at his baseline. He does not appear to be acutely psychotic and stabilization has been obtained through the emergency room be means of medication adjustment, crisis stabilization and daily re-evaluations.    Total Time spent with patient: 30 minutes  Past Psychiatric History: Alcohol dependence.  Patient denies any previous inpatient psychiatric admission.  Currently receiving outpatient psychiatric services at [facility].  He is currently under the care of Eulis Canner, and has an upcoming appointment this Thursday.  Denies any history of suicide attempt.  Past Medical History:  Past Medical History:  Diagnosis Date   Alcohol dependence ([place])    COVID-19    Suicidal behavior with attempted self-injury [facility])    History reviewed. No pertinent surgical history. Family History:  Family History  Problem Relation Age of Onset   Lupus Mother    Hypertension Father    Family Psychiatric  History: Denies Social History:  Social History   Substance and Sexual Activity  Alcohol Use Yes   Alcohol/week: 70.0 standard drinks   Types: 70 Standard drinks or equivalent per week     Social History   Substance and Sexual Activity  Drug Use Not Currently   Types: Benzodiazepines, Cocaine    Social History    Socioeconomic History   Marital status: Single    Spouse name: Not on file   Number of children: Not on file   Years of education: Not on file   Highest education level: Not on file  Occupational History   Not on file  Tobacco Use   Smoking status: Every Day    Types: E-cigarettes   Smokeless tobacco: Never  Vaping Use   Vaping Use: Every day   Substances: Nicotine, Flavoring  Substance and Sexual Activity   Alcohol use: Yes    Alcohol/week: 70.0 standard drinks    Types: 70 Standard drinks or equivalent per week   Drug use: Not Currently    Types: Benzodiazepines, Cocaine   Sexual activity: Not Currently  Other Topics Concern   Not on file  Social History Narrative   Not on file   Social Determinants of Health   Financial Resource Strain: Not on file  Food Insecurity: Not on file  Transportation Needs: Not on file  Physical Activity: Not on file  Stress: Not on file  Social Connections: Not on file    Tobacco Cessation:  N/A, patient does not currently use tobacco products  Current Medications: Current Facility-Administered Medications  Medication Dose Route Frequency Provider Last Rate Last Admin   alum & mag hydroxide-simeth (MAALOX/MYLANTA) 200-200-20 MG/5ML suspension 30 mL  30 mL Oral Q6H PRN Long, Wonda Olds, MD       ibuprofen (ADVIL) tablet 600 mg  600 mg Oral Q8H PRN Long, Wonda Olds, MD       LORazepam (ATIVAN) injection 0-4 mg  0-4 mg Intravenous Q12H Quintella Reichert, MD  Or   LORazepam (ATIVAN) tablet 0-4 mg  0-4 mg Oral Q12H Quintella Reichert, MD       nicotine (NICODERM CQ - dosed in mg/24 hours) patch 21 mg  21 mg Transdermal Daily Long, Wonda Olds, MD   21 mg at 12/14/21 0811   ondansetron (ZOFRAN) tablet 4 mg  4 mg Oral Q8H PRN Long, Wonda Olds, MD       thiamine tablet 100 mg  100 mg Oral Daily Quintella Reichert, MD   100 mg at 12/14/21 [id number]   Or   thiamine (B-1) injection 100 mg  100 mg Intravenous Daily Quintella Reichert, MD   100 mg at 12/13/21 1025    venlafaxine XR (EFFEXOR-XR) 24 hr capsule 150 mg  150 mg Oral Q breakfast Quintella Reichert, MD   150 mg at 12/14/21 [id number]   Current Outpatient Medications  Medication Sig Dispense Refill   ASHWAGANDHA PO Take 1 capsule by mouth daily.     ibuprofen (ADVIL) 200 MG tablet Take 200-400 mg by mouth every 6 (six) hours as needed for mild pain or headache.     levocetirizine (XYZAL) 5 MG tablet Take 5 mg by mouth every evening.     mirtazapine (REMERON) 30 MG tablet Take 1 tablet (30 mg total) by mouth at bedtime. 30 tablet 2   venlafaxine XR (EFFEXOR-XR) 150 MG 24 hr capsule Take 1 capsule (150 mg total) by mouth daily with breakfast. 30 capsule 3   chlordiazePOXIDE (LIBRIUM) 25 MG capsule 50mg  PO TID x 1D, then 25-50mg  PO BID X 1D, then 25-50mg  PO QD X 1D (Patient not taking: Reported on 12/12/2021) 15 capsule 0   naltrexone (DEPADE) 50 MG tablet Take 1 tablet (50 mg total) by mouth daily. (Patient not taking: Reported on 12/12/2021) 30 tablet 3   venlafaxine XR (EFFEXOR-XR) 37.5 MG 24 hr capsule Take 1 capsule (37.5 mg total) by mouth daily with breakfast. (Patient not taking: Reported on 12/12/2021) 30 capsule 3   PTA Medications: (Not in a hospital admission)   Musculoskeletal: Strength & Muscle Tone: within normal limits Gait & Station: normal Patient leans: N/A  Psychiatric Specialty Exam:  Presentation  General Appearance: Appropriate for Environment; Casual  Eye Contact:Fair  Speech:Clear and Coherent; Normal Rate  Speech Volume:Normal  Handedness:Right   Mood and Affect  Mood:Euthymic  Affect:Appropriate; Congruent   Thought Process  Thought Processes:Coherent; Linear; Goal Directed  Descriptions of Associations:Intact  Orientation:Full (Time, Place and Person)  Thought Content:WDL  History of Schizophrenia/Schizoaffective disorder:No  Duration of Psychotic Symptoms:No data recorded Hallucinations:Hallucinations: None  Ideas of Reference:None  Suicidal  Thoughts:Suicidal Thoughts: No  Homicidal Thoughts:Homicidal Thoughts: No   Sensorium  Memory:Immediate Good; Remote Good; Recent Good  Judgment:Fair  Insight:Good   Executive Functions  Concentration:Good  Attention Span:Good  [place] of Knowledge:Good  Language:Good   Psychomotor Activity  Psychomotor Activity:Psychomotor Activity: Normal   Assets  Assets:Communication Skills; Desire for Improvement; Leisure Time; Physical Health; Resilience; Social Support; Talents/Skills; Housing; Intimacy   Sleep  Sleep:Sleep: Good    Physical Exam: Physical Exam Vitals and nursing note reviewed.  HENT:     Head: Normocephalic and atraumatic.  Eyes:     Pupils: Pupils are equal, round, and reactive to light.  Skin:    General: Skin is warm and dry.     Capillary Refill: Capillary refill takes less than 2 seconds.  Neurological:     General: No focal deficit present.     Mental Status: He is oriented to  person, place, and time. Mental status is at baseline.  Psychiatric:        Mood and Affect: Mood normal.        Behavior: Behavior normal.        Thought Content: Thought content normal.        Judgment: Judgment normal.   Review of Systems  Psychiatric/Behavioral: Negative.  Negative for depression, hallucinations, memory loss, substance abuse and suicidal ideas. The patient is not nervous/anxious and does not have insomnia.   All other systems reviewed and are negative. Blood pressure (!) 137/103, pulse 72, temperature 98.5 F (36.9 C), temperature source Oral, resp. rate 18, height 6\' 1"  (1.854 m), weight 83.9 kg, SpO2 96 %. Body mass index is 24.41 kg/m.   Demographic Factors:  Male, Adolescent or young adult, Caucasian, and Low socioeconomic status  Loss Factors: Financial problems/change in socioeconomic status  Historical Factors: Impulsivity  Risk Reduction Factors:   Sense of responsibility to family, Living with another person,  especially a relative, Positive social support, Positive therapeutic relationship, and Positive coping skills or problem solving skills  Continued Clinical Symptoms:  Depression:   Impulsivity Alcohol/Substance Abuse/Dependencies Previous Psychiatric Diagnoses and Treatments  Cognitive Features That Contribute To Risk:  None    Suicide Risk:  Minimal: No identifiable suicidal ideation.  Patients presenting with no risk factors but with morbid ruminations; may be classified as minimal risk based on the severity of the depressive symptoms    Plan Of Care/Follow-up recommendations:  Other:  COntinue current medications. Continue current outpatient plan with [facility] on Januray 12, 2023.  Patient will benefit from ongoing substance abuse intensive outpatient programming.  We did review negative effects from alcohol use on his mental health.  Patient verbalizes understanding, denies any true intent recently and or historically to self-harm and or attempt suicide.  Collateral obtained from mother please see note by Guinevere Ferrari, mother also denies any concerns or acute psychiatric concerns that would warrant further investigation and or inpatient psychiatric hospitalization.  At this time will psychiatrically clear patient to discharge home.  Disposition: Psych cleared Suella Broad, FNP 12/14/2021, 12:55 PM

## 2021-12-14 NOTE — ED Provider Notes (Signed)
Patient has been evaluated by TTS and recommended for discharge and outpatient follow-up.  They have given the patient appropriate information for outpatient care.  Stable at time of discharge.   Jeff Logan, DO 12/14/21 1254

## 2021-12-14 NOTE — Progress Notes (Signed)
[facility] LCSW Progress Note   12/14/2021    1:07 PM  [name]   [id number]   Type of Contact and Topic:  Psychiatric Bed Placement     Patient information has been sent to Attending at [facility] via secure chat to review for potential admission. Patient has not yet been accepted at this time. Patient meets inpatient criteria per Caryn Bee, NP.   Situation ongoing, CSW will continue to monitor and update note as more information becomes available.    Signed:  Corky Crafts, MSW, LCSWA, LCAS 12/14/2021 1:07 PM

## 2021-12-14 NOTE — BH Assessment (Addendum)
Collateral was obtained this date from patient's mother Reggy Dryer [phone] to assist with discharge planning. Mother states she has no concerns in reference to patient harming himself or others. Patient is currently contracting for safety and has a appointment for OP follow up at [facility] with Eulis Canner this Thursday 12/17/21. Patient to be discharged and states he is going to become involved with a OP program at [facility] or another area provider.

## 2021-12-14 NOTE — ED Provider Notes (Signed)
Emergency Medicine Observation Re-evaluation Note  [name] is a 31 y.o. male, seen on rounds today.  Pt initially presented to the ED for complaints of Drug Overdose (Pt presented to the ED after intentionally overdosing on his Remeron prescription.  He also endorses withdrawal symptoms) Currently, the patient is sleeping  Physical Exam  BP (!) 137/103    Pulse 72    Temp 98.5 F (36.9 C) (Oral)    Resp 18    Ht 6\' 1"  (1.854 m)    Wt 83.9 kg    SpO2 96%    BMI 24.41 kg/m  Physical Exam General: wdwn Cardiac: rrr Lungs: clear Psych: sleeping  ED Course / MDM  EKG:EKG Interpretation  Date/Time:  Saturday December 12 2021 01:33:57 EST Ventricular Rate:  82 PR Interval:  135 QRS Duration: 91 QT Interval:  379 QTC Calculation: 443 R Axis:   67 Text Interpretation: Sinus rhythm RSR' in V1 or V2, probably normal variant ST elev, probable normal early repol pattern No previous ECGs available Confirmed by 12-25-2004 [phone]) on 12/12/2021 1:36:19 AM  I have reviewed the labs performed to date as well as medications administered while in observation.  Recent changes in the last 24 hours include   Plan  Current plan is for tts disposition  [name] is not under involuntary commitment.     [name], PA-C 12/14/21 02/11/22    [id number], DO 12/14/21 1454

## 2021-12-17 ENCOUNTER — Ambulatory Visit (INDEPENDENT_AMBULATORY_CARE_PROVIDER_SITE_OTHER): Payer: No Payment, Other | Admitting: Clinical

## 2021-12-17 DIAGNOSIS — F411 Generalized anxiety disorder: Secondary | ICD-10-CM | POA: Diagnosis not present
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
## 2021-12-19 NOTE — Plan of Care (Signed)
Client was in agreement with the treatment plan. °

## 2021-12-21 ENCOUNTER — Other Ambulatory Visit: Payer: Self-pay

## 2021-12-31 ENCOUNTER — Ambulatory Visit (INDEPENDENT_AMBULATORY_CARE_PROVIDER_SITE_OTHER): Payer: No Payment, Other | Admitting: Clinical

## 2021-12-31 DIAGNOSIS — F411 Generalized anxiety disorder: Secondary | ICD-10-CM | POA: Diagnosis not present

## 2022-01-03 NOTE — Progress Notes (Signed)
° °  THERAPIST PROGRESS NOTE Virtual Visit via Video Note  I connected with [name] on 12/31/2021 at 10:00 AM EST by a video enabled telemedicine application and verified that I am speaking with the correct person using two identifiers.  Location: Patient: home Provider: office   I discussed the limitations of evaluation and management by telemedicine and the availability of in person appointments. The patient expressed understanding and agreed to proceed.   Follow Up Instructions: I discussed the assessment and treatment plan with the patient. The patient was provided an opportunity to ask questions and all were answered. The patient agreed with the plan and demonstrated an understanding of the instructions.   The patient was advised to call back or seek an in-person evaluation if the symptoms worsen or if the condition fails to improve as anticipated.   Session Time: 25 minutes  Participation Level: Active  Behavioral Response: CasualAlertAnxious  Type of Therapy: Individual Therapy  Treatment Goals addressed: Coping  Interventions: CBT and Supportive  Summary:  [name] is a 31 y.o. male who presents for the scheduled session oriented times five, appropriately dressed, and friendly. Client denied hallucinations and delusions. Client reported on today he is doing well. Client reported since he was last seen he quit his job at [employer]. Client reported his Comptroller was rude and they were consistently cutting his hours at work which wasn't enough to pay his bills. Client reported he found another job willing to pay him more and give him full time hours that is within walking distance to his house. Client reported he is happy about that while he doesn't have a license. Client reported otherwise he meets with his lawyer today and looking forward with planning next steps and what to expect. Client reported this morning he had a parent teacher meeting which he attended  via video with his daughters mother. Client reported any interaction with his daughters mother gives him anxiety and is not sure why that is. Client reported he was successful with going to the gym and his journaling has mainly consisted of songs. Client reported the remeron is effective but the effexor not so much anymore.   Suicidal/Homicidal: Nowithout intent/plan  Therapist Response:  Therapist began the appointment asking the client how he has been doing since last seen. Therapist used CBT to utilize active listening and positive emotional support. Therapist used CBT about his application of homework suggested at the last session. Therapist used CBT to ask the client to describe the severity of his anxiety symptoms and triggers from recent events that have affected it. Therapist used CBT to break down the specific process of journaling pertaining to anxiety triggers. Therapist assigned the client homework to practice the template for anxiety triggers. Client was scheduled for next appointment.    Plan: Return again in 4 weeks.  Diagnosis: Generalized anxiety disorder   Neena Rhymes Lux Meaders, LCSW 12/31/2021

## 2022-01-12 ENCOUNTER — Encounter (HOSPITAL_COMMUNITY): Payer: Self-pay

## 2022-01-12 ENCOUNTER — Other Ambulatory Visit: Payer: Self-pay

## 2022-01-12 ENCOUNTER — Emergency Department (HOSPITAL_COMMUNITY)
Admission: EM | Admit: 2022-01-12 | Discharge: 2022-01-12 | Disposition: A | Discharge status: Home / Self Care | Payer: 59 | Attending: Emergency Medicine | Admitting: Emergency Medicine

## 2022-01-12 DIAGNOSIS — Z20822 Contact with and (suspected) exposure to covid-19: Secondary | ICD-10-CM | POA: Insufficient documentation

## 2022-01-12 DIAGNOSIS — R030 Elevated blood-pressure reading, without diagnosis of hypertension: Secondary | ICD-10-CM | POA: Diagnosis not present

## 2022-01-12 DIAGNOSIS — F1010 Alcohol abuse, uncomplicated: Secondary | ICD-10-CM | POA: Insufficient documentation

## 2022-01-12 LAB — RAPID URINE DRUG SCREEN, HOSP PERFORMED
Amphetamines: NOT DETECTED
Barbiturates: NOT DETECTED
Benzodiazepines: POSITIVE — AB
Cocaine: NOT DETECTED
Opiates: NOT DETECTED
Tetrahydrocannabinol: NOT DETECTED

## 2022-01-12 LAB — RESP PANEL BY RT-PCR (FLU A&B, COVID) ARPGX2
Influenza A by PCR: NEGATIVE
Influenza B by PCR: NEGATIVE
SARS Coronavirus 2 by RT PCR: NEGATIVE

## 2022-01-12 LAB — COMPREHENSIVE METABOLIC PANEL
ALT: 25 U/L (ref 0–44)
AST: 29 U/L (ref 15–41)
Albumin: 4.7 g/dL (ref 3.5–5.0)
Alkaline Phosphatase: 46 U/L (ref 38–126)
Anion gap: 13 (ref 5–15)
BUN: 24 mg/dL — ABNORMAL HIGH (ref 6–20)
CO2: 23 mmol/L (ref 22–32)
Calcium: 8.9 mg/dL (ref 8.9–10.3)
Chloride: 105 mmol/L (ref 98–111)
Creatinine, Ser: 1.09 mg/dL (ref 0.61–1.24)
GFR, Estimated: >60 mL/min (ref >60)
Glucose, Bld: 66 mg/dL — ABNORMAL LOW (ref 70–99)
Potassium: 4.4 mmol/L (ref 3.5–5.1)
Sodium: 141 mmol/L (ref 135–145)
Total Bilirubin: 0.5 mg/dL (ref 0.3–1.2)
Total Protein: 8.4 g/dL — ABNORMAL HIGH (ref 6.5–8.1)

## 2022-01-12 LAB — CBC
HCT: 44.9 % (ref 39.0–52.0)
Hemoglobin: 15.2 g/dL (ref 13.0–17.0)
MCH: 30.6 pg (ref 26.0–34.0)
MCHC: 33.9 g/dL (ref 30.0–36.0)
MCV: 90.3 fL (ref 80.0–100.0)
Platelets: 171 10*3/uL (ref 150–400)
RBC: 4.97 MIL/uL (ref 4.22–5.81)
RDW: 13.5 % (ref 11.5–15.5)
WBC: 7.4 10*3/uL (ref 4.0–10.5)
nRBC: 0 % (ref 0.0–0.2)

## 2022-01-12 MED ORDER — CHLORDIAZEPOXIDE HCL 25 MG PO CAPS: ORAL_CAPSULE | ORAL | 0 refills | Status: DC

## 2022-01-12 MED ORDER — LORAZEPAM 1 MG PO TABS
1.0000 mg | ORAL_TABLET | Freq: Once | ORAL | Status: AC
  Administered 2022-01-12: 1 mg via ORAL
  Filled 2022-01-12: qty 1

## 2022-01-12 NOTE — ED Provider Notes (Signed)
[facility] DEPT Provider Note   CSN: [id number] Arrival date & time: 01/12/22  0920     History  Chief Complaint  Patient presents with   Withdrawal    [name] is a 31 y.o. male.  HPI 31 yo male ho etoh abuse recent ed visit for same and d/c'd home.  4-5 days ago began drinking again.  Drinking 10-12 beer/day.  Patient had last drink 10 hours ago.  He states he goes to op therapy at [facility].  He states he called them last night and was told to come to ED either "now or in the morning."      Home Medications Prior to Admission medications   Medication Sig Start Date End Date Taking? Authorizing Provider  chlordiazePOXIDE (LIBRIUM) 25 MG capsule 41m PO TID x 1D, then 25-580mPO BID X 1D, then 25-5061mO QD X 1D 01/12/22  Yes Kieon Lawhorn, DanAndee PolesD  ASHWAGANDHA PO Take 1 capsule by mouth daily.    [provider]  ibuprofen (ADVIL) 200 MG tablet Take 200-400 mg by mouth every 6 (six) hours as needed for mild pain or headache.    [provider]  levocetirizine (XYZAL) 5 MG tablet Take 5 mg by mouth every evening.    [provider]  mirtazapine (REMERON) 30 MG tablet Take 1 tablet (30 mg total) by mouth at bedtime. 12/08/21 12/08/22  ParSalley SlaughterP  naltrexone (DEPADE) 50 MG tablet Take 1 tablet (50 mg total) by mouth daily. Patient not taking: Reported on 12/12/2021 12/08/21   ParSalley SlaughterP  venlafaxine XR (EFFEXOR-XR) 150 MG 24 hr capsule Take 1 capsule (150 mg total) by mouth daily with breakfast. 12/08/21   ParSalley SlaughterP  venlafaxine XR (EFFEXOR-XR) 37.5 MG 24 hr capsule Take 1 capsule (37.5 mg total) by mouth daily with breakfast. Patient not taking: Reported on 12/12/2021 12/08/21   ParSalley SlaughterP      Allergies    Benadryl [diphenhydramine] and Codeine    Review of Systems   Review of Systems  All other systems reviewed and are negative.  Physical Exam Updated Vital Signs BP 136/81 (BP  Location: Right Arm)    Pulse 97    Temp 98.4 F (36.9 C) (Oral)    Resp 16    Ht 1.854 m (6' 1" )    Wt 83.9 kg    SpO2 100%    BMI 24.41 kg/m  Physical Exam Vitals and nursing note reviewed.  Constitutional:      Appearance: Normal appearance.  HENT:     Head: Normocephalic.     Right Ear: External ear normal.     Left Ear: External ear normal.     Nose: Nose normal.     Mouth/Throat:     Pharynx: Oropharynx is clear.  Eyes:     Extraocular Movements: Extraocular movements intact.     Pupils: Pupils are equal, round, and reactive to light.  Cardiovascular:     Rate and Rhythm: Normal rate.  Pulmonary:     Effort: Pulmonary effort is normal.     Breath sounds: Normal breath sounds.  Abdominal:     General: Abdomen is flat.     Palpations: Abdomen is soft.  Musculoskeletal:        General: Normal range of motion.     Cervical back: Normal range of motion.  Skin:    General: Skin is warm and dry.     Capillary  Refill: Capillary refill takes less than 2 seconds.  Neurological:     General: No focal deficit present.     Mental Status: He is alert.  Psychiatric:        Mood and Affect: Mood normal.        Behavior: Behavior normal.    ED Results / Procedures / Treatments   Labs (all labs ordered are listed, but only abnormal results are displayed) Labs Reviewed  COMPREHENSIVE METABOLIC PANEL - Abnormal; Notable for the following components:      Result Value   Glucose, Bld 66 (*)    BUN 24 (*)    Total Protein 8.4 (*)    All other components within normal limits  RAPID URINE DRUG SCREEN, HOSP PERFORMED - Abnormal; Notable for the following components:   Benzodiazepines POSITIVE (*)    All other components within normal limits  RESP PANEL BY RT-PCR (FLU A&B, COVID) ARPGX2  CBC    EKG None  Radiology No results found.  Procedures Procedures    Medications Ordered in ED Medications  LORazepam (ATIVAN) tablet 1 mg (1 mg Oral Given 01/12/22 0958)    ED  Course/ Medical Decision Making/ A&P Clinical Course as of 01/12/22 1340  Tue Jan 12, 2022  1322 CBC reviewed and interpreted within normal limits C-Met reviewed and mild hyperglycemia noted patient was given oral repletion Otherwise within normal limits Urine drug screen is significant only for benzodiazepines the patient was given Ativan here in the ED.  [DR]    Clinical Course User Index [DR] Pattricia Boss, MD                           Medical Decision Making This is a 31 year old male with history of anxiety and alcohol abuse.  He has recently gone through alcohol withdrawal.  He been out doing well when he started drinking again several days ago.  He is now stopped drinking is concerned he is having alcohol withdrawal.  Here in the ED his vital signs have been stable.  He is mildly hypertensive blood pressure 138/88.  His heart rate is 88.  He has no tremors noted. He was seen and observed here in the ED and given a milligram of Ativan.  He is continued to be stable here in the ED.  Labs were checked and are normal.  Patient denies suicidality, homicidality, he is not hearing any voices. He has follow-up in place.  He will be given Librium taper. Patient does not appear to be in acute alcohol withdrawal and appears to be stable for discharge.  Amount and/or Complexity of Data Reviewed Labs: ordered. Decision-making details documented in ED Course.  Risk Prescription drug management.           Final Clinical Impression(s) / ED Diagnoses Final diagnoses:  ETOH abuse    Rx / DC Orders ED Discharge Orders          Ordered    chlordiazePOXIDE (LIBRIUM) 25 MG capsule        01/12/22 1340              Pattricia Boss, MD 01/12/22 1340

## 2022-01-12 NOTE — ED Triage Notes (Signed)
"  Normally drink 10-12 beers a day, last drink was 10 hours ago and I am starting to have shakes and anxiety" per pt

## 2022-01-12 NOTE — Discharge Instructions (Addendum)
Please use Librium taper Do not drink alcohol in combination with this Follow-up with your outpatient counselors and mental health specialist. Return if you are worsening in any time

## 2022-01-12 NOTE — ED Notes (Signed)
I provided reinforced discharge education based off of discharge instructions. Pt acknowledged and understood my education. Pt had no further questions/concerns for provider/myself.  °

## 2022-01-12 NOTE — ED Notes (Signed)
Pt advised unable to provide urine specimen at this time, I provided pt with some water.

## 2022-01-18 ENCOUNTER — Other Ambulatory Visit: Payer: Self-pay

## 2022-01-19 ENCOUNTER — Ambulatory Visit (HOSPITAL_COMMUNITY): Payer: 59 | Admitting: Clinical

## 2022-01-19 ENCOUNTER — Other Ambulatory Visit: Payer: Self-pay

## 2022-02-11 ENCOUNTER — Encounter (HOSPITAL_COMMUNITY): Payer: Self-pay | Admitting: Emergency Medicine

## 2022-02-11 ENCOUNTER — Emergency Department (HOSPITAL_COMMUNITY)
Admission: EM | Admit: 2022-02-11 | Discharge: 2022-02-11 | Disposition: A | Discharge status: Home / Self Care | Payer: 59 | Attending: Emergency Medicine | Admitting: Emergency Medicine

## 2022-02-11 ENCOUNTER — Telehealth: Payer: Self-pay

## 2022-02-11 ENCOUNTER — Other Ambulatory Visit: Payer: Self-pay

## 2022-02-11 DIAGNOSIS — R000 Tachycardia, unspecified: Secondary | ICD-10-CM | POA: Insufficient documentation

## 2022-02-11 DIAGNOSIS — Y904 Blood alcohol level of 80-99 mg/100 ml: Secondary | ICD-10-CM | POA: Diagnosis not present

## 2022-02-11 DIAGNOSIS — R251 Tremor, unspecified: Secondary | ICD-10-CM | POA: Insufficient documentation

## 2022-02-11 DIAGNOSIS — F10230 Alcohol dependence with withdrawal, uncomplicated: Secondary | ICD-10-CM | POA: Insufficient documentation

## 2022-02-11 DIAGNOSIS — F10930 Alcohol use, unspecified with withdrawal, uncomplicated: Secondary | ICD-10-CM

## 2022-02-11 DIAGNOSIS — F419 Anxiety disorder, unspecified: Secondary | ICD-10-CM | POA: Insufficient documentation

## 2022-02-11 LAB — COMPREHENSIVE METABOLIC PANEL
ALT: 29 U/L (ref 0–44)
AST: 38 U/L (ref 15–41)
Albumin: 4.7 g/dL (ref 3.5–5.0)
Alkaline Phosphatase: 62 U/L (ref 38–126)
Anion gap: 13 (ref 5–15)
BUN: 14 mg/dL (ref 6–20)
CO2: 24 mmol/L (ref 22–32)
Calcium: 9.1 mg/dL (ref 8.9–10.3)
Chloride: 103 mmol/L (ref 98–111)
Creatinine, Ser: 0.89 mg/dL (ref 0.61–1.24)
GFR, Estimated: >60 mL/min (ref >60)
Glucose, Bld: 76 mg/dL (ref 70–99)
Potassium: 4.1 mmol/L (ref 3.5–5.1)
Sodium: 140 mmol/L (ref 135–145)
Total Bilirubin: 0.7 mg/dL (ref 0.3–1.2)
Total Protein: 8.7 g/dL — ABNORMAL HIGH (ref 6.5–8.1)

## 2022-02-11 LAB — CBC WITH DIFFERENTIAL/PLATELET
Abs Immature Granulocytes: 0.02 10*3/uL (ref 0.00–0.07)
Basophils Absolute: 0.1 10*3/uL (ref 0.0–0.1)
Basophils Relative: 1 %
Eosinophils Absolute: 0.1 10*3/uL (ref 0.0–0.5)
Eosinophils Relative: 2 %
HCT: 46.6 % (ref 39.0–52.0)
Hemoglobin: 15.6 g/dL (ref 13.0–17.0)
Immature Granulocytes: 0 %
Lymphocytes Relative: 29 %
Lymphs Abs: 1.5 10*3/uL (ref 0.7–4.0)
MCH: 29.7 pg (ref 26.0–34.0)
MCHC: 33.5 g/dL (ref 30.0–36.0)
MCV: 88.8 fL (ref 80.0–100.0)
Monocytes Absolute: 0.5 10*3/uL (ref 0.1–1.0)
Monocytes Relative: 10 %
Neutro Abs: 3.1 10*3/uL (ref 1.7–7.7)
Neutrophils Relative %: 58 %
Platelets: 156 10*3/uL (ref 150–400)
RBC: 5.25 MIL/uL (ref 4.22–5.81)
RDW: 13.1 % (ref 11.5–15.5)
WBC: 5.2 10*3/uL (ref 4.0–10.5)
nRBC: 0 % (ref 0.0–0.2)

## 2022-02-11 LAB — MAGNESIUM: Magnesium: 2.3 mg/dL (ref 1.7–2.4)

## 2022-02-11 LAB — ACETAMINOPHEN LEVEL: Acetaminophen (Tylenol), Serum: <10 ug/mL — ABNORMAL LOW (ref 10–30)

## 2022-02-11 LAB — SALICYLATE LEVEL: Salicylate Lvl: <7 mg/dL — ABNORMAL LOW (ref 7.0–30.0)

## 2022-02-11 LAB — ETHANOL: Alcohol, Ethyl (B): 92 mg/dL — ABNORMAL HIGH (ref <10)

## 2022-02-11 MED ORDER — SODIUM CHLORIDE 0.9 % IV BOLUS
1000.0000 mL | Freq: Once | INTRAVENOUS | Status: AC
  Administered 2022-02-11: 08:00:00 1000 mL via INTRAVENOUS

## 2022-02-11 MED ORDER — CHLORDIAZEPOXIDE HCL 25 MG PO CAPS: ORAL_CAPSULE | ORAL | 0 refills | Status: DC

## 2022-02-11 MED ORDER — LORAZEPAM 2 MG/ML IJ SOLN
1.0000 mg | Freq: Once | INTRAMUSCULAR | Status: AC
  Administered 2022-02-11: 08:00:00 1 mg via INTRAVENOUS
  Filled 2022-02-11: qty 1

## 2022-02-11 MED ORDER — SODIUM CHLORIDE 0.9 % IV SOLN: INTRAVENOUS | Status: DC

## 2022-02-11 NOTE — ED Triage Notes (Signed)
Patient reports withdrawing from alcohol, drinks 10-12 beers per day, has been in an outpt rehab program and has been clean, has been drinking for the past 5 days. Reports lat drink about 10 hours ago, reports anxiety and shaking.  ?

## 2022-02-11 NOTE — ED Provider Notes (Signed)
?[facility]-EMERGENCY DEPT ?Provider Note ? ? ?CSN: [id number] ?Arrival date & time: 02/11/22  0703 ? ?  ? ?History ? ?No chief complaint on file. ? ? ?Royce MacadamiaJustin T Ciano is a 31 y.o. male. ? ?Pt is a 31 yo male with a hx of alcohol abuse.  He said he's been in an outpatient program and has been going to therapy.  However, he relapsed about 5 days ago and has been drinking all day long.  He last had a drink last night and wants to try to stop again.  He feels very shaky and anxious.  He has skipped his program while he's been drinking. ? ? ?  ? ?Home Medications ?Prior to Admission medications   ?Medication Sig Start Date End Date Taking? Authorizing Provider  ?ASHWAGANDHA PO Take 1 capsule by mouth daily.    [provider]  ?chlordiazePOXIDE (LIBRIUM) 25 MG capsule 50mg  PO TID x 1D, then 25-50mg  PO BID X 1D, then 25-50mg  PO QD X 1D 02/11/22   Jacalyn LefevreHaviland, Avigayil Ton, MD  ?ibuprofen (ADVIL) 200 MG tablet Take 200-400 mg by mouth every 6 (six) hours as needed for mild pain or headache.    [provider]  ?levocetirizine (XYZAL) 5 MG tablet Take 5 mg by mouth every evening.    [provider]  ?mirtazapine (REMERON) 30 MG tablet Take 1 tablet (30 mg total) by mouth at bedtime. 12/08/21 12/08/22  Shanna CiscoParsons, Brittney E, NP  ?naltrexone (DEPADE) 50 MG tablet Take 1 tablet (50 mg total) by mouth daily. ?Patient not taking: Reported on 12/12/2021 12/08/21   Shanna CiscoParsons, Brittney E, NP  ?venlafaxine XR (EFFEXOR-XR) 150 MG 24 hr capsule Take 1 capsule (150 mg total) by mouth daily with breakfast. 12/08/21   Shanna CiscoParsons, Brittney E, NP  ?venlafaxine XR (EFFEXOR-XR) 37.5 MG 24 hr capsule Take 1 capsule (37.5 mg total) by mouth daily with breakfast. ?Patient not taking: Reported on 12/12/2021 12/08/21   Shanna CiscoParsons, Brittney E, NP  ?   ? ?Allergies    ?Benadryl [diphenhydramine] and Codeine   ? ?Review of Systems   ?Review of Systems  ?Neurological:  Positive for tremors.  ?Psychiatric/Behavioral:  The patient is  nervous/anxious.   ?All other systems reviewed and are negative. ? ?Physical Exam ?Updated Vital Signs ?BP (!) 135/98   Pulse 87   Temp 97.9 ?F (36.6 ?C) (Oral)   Resp 15   Ht 6\' 1"  (1.854 m)   Wt 84 kg   SpO2 99%   BMI 24.43 kg/m?  ?Physical Exam ?Vitals and nursing note reviewed.  ?Constitutional:   ?   Appearance: Normal appearance.  ?HENT:  ?   Head: Normocephalic and atraumatic.  ?   Right Ear: External ear normal.  ?   Left Ear: External ear normal.  ?   Nose: Nose normal.  ?   Mouth/Throat:  ?   Mouth: Mucous membranes are dry.  ?Eyes:  ?   Extraocular Movements: Extraocular movements intact.  ?   Conjunctiva/sclera: Conjunctivae normal.  ?   Pupils: Pupils are equal, round, and reactive to light.  ?Cardiovascular:  ?   Rate and Rhythm: Regular rhythm. Tachycardia present.  ?   Pulses: Normal pulses.  ?   Heart sounds: Normal heart sounds.  ?Pulmonary:  ?   Effort: Pulmonary effort is normal.  ?   Breath sounds: Normal breath sounds.  ?Abdominal:  ?   General: Abdomen is flat. Bowel sounds are normal.  ?   Palpations: Abdomen is soft.  ?  Musculoskeletal:     ?   General: Normal range of motion.  ?   Cervical back: Normal range of motion and neck supple.  ?Skin: ?   General: Skin is warm.  ?   Capillary Refill: Capillary refill takes less than 2 seconds.  ?Neurological:  ?   General: No focal deficit present.  ?   Mental Status: He is alert and oriented to person, place, and time.  ?   Motor: Tremor present.  ?Psychiatric:     ?   Attention and Perception: Attention normal.     ?   Mood and Affect: Mood is anxious.  ? ? ?ED Results / Procedures / Treatments   ?Labs ?(all labs ordered are listed, but only abnormal results are displayed) ?Labs Reviewed  ?COMPREHENSIVE METABOLIC PANEL - Abnormal; Notable for the following components:  ?    Result Value  ? Total Protein 8.7 (*)   ? All other components within normal limits  ?SALICYLATE LEVEL - Abnormal; Notable for the following components:  ? Salicylate Lvl  <7.0 (*)   ? All other components within normal limits  ?ACETAMINOPHEN LEVEL - Abnormal; Notable for the following components:  ? Acetaminophen (Tylenol), Serum <10 (*)   ? All other components within normal limits  ?ETHANOL - Abnormal; Notable for the following components:  ? Alcohol, Ethyl (B) 92 (*)   ? All other components within normal limits  ?CBC WITH DIFFERENTIAL/PLATELET  ?MAGNESIUM  ?RAPID URINE DRUG SCREEN, HOSP PERFORMED  ?URINALYSIS, ROUTINE W REFLEX MICROSCOPIC  ? ? ?EKG ?EKG Interpretation ? ?Date/Time:  Thursday February 11 2022 07:55:11 EST ?Ventricular Rate:  93 ?PR Interval:  125 ?QRS Duration: 87 ?QT Interval:  355 ?QTC Calculation: 442 ?R Axis:   79 ?Text Interpretation: Sinus rhythm RSR' in V1 or V2, probably normal variant ST elev, probable normal early repol pattern No significant change since last tracing Confirmed by Jacalyn Lefevre [phone]) on 02/11/2022 8:11:25 AM ? ?Radiology ?No results found. ? ?Procedures ?Procedures  ? ? ?Medications Ordered in ED ?Medications  ?sodium chloride 0.9 % bolus 1,000 mL (0 mLs Intravenous Stopped 02/11/22 0913)  ?  And  ?0.9 %  sodium chloride infusion (has no administration in time range)  ?LORazepam (ATIVAN) injection 1 mg (1 mg Intravenous Given 02/11/22 0809)  ? ? ?ED Course/ Medical Decision Making/ A&P ?  ?                        ?Medical Decision Making ?Amount and/or Complexity of Data Reviewed ?Labs: ordered. ? ?Risk ?Prescription drug management. ? ? ?This patient presents to the ED for concern of alcohol withdrawal, this involves an extensive number of treatment options, and is a complaint that carries with it a high risk of complications and morbidity.  The differential diagnosis includes alcohol withdrawal, electrolyte imbalance ? ? ?Co morbidities that complicate the patient evaluation ? ?Alcohol dependence ? ? ?Additional history obtained: ? ?Additional history obtained from epic chart review ? ? ? ?Lab Tests: ? ?I Ordered, and personally interpreted  labs.  The pertinent results include:  cbc nl, cmp nl, mg nl. Etoh elevated at 92 ? ? ?Cardiac Monitoring: ? ?The patient was maintained on a cardiac monitor.  I personally viewed and interpreted the cardiac monitored which showed an underlying rhythm of: sinus tachy initially, but nsr at d/c ? ? ?Medicines ordered and prescription drug management: ? ?I ordered medication including ativan and IVFs for withdrawal sx  ?Reevaluation  of the patient after these medicines showed that the patient improved ?I have reviewed the patients home medicines and have made adjustments as needed ? ? ? ?Critical Interventions: ? ?Ativan and IVFs ? ?Problem List / ED Course: ? ?Alcohol withdrawal:  pt feels better after fluids and ativan.  Vitals are better. He is no longer shaky or tachycardic.  He is encouraged to go back to his rehab and to avoid alcohol.  He is d/c with librium. ? ? ?Reevaluation: ? ?After the interventions noted above, I reevaluated the patient and found that they have :improved ? ? ?Social Determinants of Health: ? ?Lives at home ? ? ?Dispostion: ? ?After consideration of the diagnostic results and the patients response to treatment, I feel that the patent would benefit from discharge with outpatient f/u.   ? ? ? ? ? ? ? ?Final Clinical Impression(s) / ED Diagnoses ?Final diagnoses:  ?Alcohol withdrawal syndrome without complication (HCC)  ? ? ?Rx / DC Orders ?ED Discharge Orders   ? ?      Ordered  ?  chlordiazePOXIDE (LIBRIUM) 25 MG capsule       ? 02/11/22 0924  ? ?  ?  ? ?  ? ? ?  ?Jacalyn Lefevre, MD ?02/11/22 [phone] ? ?

## 2022-02-11 NOTE — Telephone Encounter (Signed)
RNCM spoke with [facility] [phone]. Gave verbal for chlordiazepoxide (Librium) 25 mg capsules #10 with no refills, 50mg  PO TID x 1D, then 25-50 mg PO BID x 1D, then 25-50mg  PO QID x 1D, Dr. Isla Pence prescribed.  ? ?RNCM spoke with patient to advise prescription has been [facility] to [facility].  ? ?No additional TOC needs ?

## 2022-02-11 NOTE — Telephone Encounter (Signed)
RNCM received call from patient stating [facility] on [address] does not have medication Librium on hand and he is requesting medication be transferred to [facility] on [address] in [place]. This RNCM will follow up. ?

## 2022-02-11 NOTE — Discharge Instructions (Addendum)
Continue with your outpatient treatment program. ?

## 2022-02-12 ENCOUNTER — Telehealth (HOSPITAL_COMMUNITY): Payer: Self-pay | Admitting: Emergency Medicine

## 2022-02-12 ENCOUNTER — Encounter (HOSPITAL_COMMUNITY): Payer: Self-pay

## 2022-02-12 ENCOUNTER — Telehealth (HOSPITAL_COMMUNITY): Payer: 59 | Admitting: Psychiatry

## 2022-02-12 MED ORDER — CHLORDIAZEPOXIDE HCL 25 MG PO CAPS: ORAL_CAPSULE | ORAL | 0 refills | Status: DC

## 2022-02-12 NOTE — Telephone Encounter (Signed)
Patient called back and reported that medicine went to wrong pharmacy.  ?

## 2022-02-17 ENCOUNTER — Other Ambulatory Visit: Payer: Self-pay

## 2022-03-18 ENCOUNTER — Ambulatory Visit (HOSPITAL_COMMUNITY): Payer: 59 | Admitting: Clinical

## 2022-03-18 ENCOUNTER — Telehealth (HOSPITAL_COMMUNITY): Payer: Self-pay | Admitting: Clinical

## 2022-03-18 ENCOUNTER — Encounter (HOSPITAL_COMMUNITY): Payer: Self-pay

## 2022-03-18 NOTE — Telephone Encounter (Signed)
Therapist sent the client a text link via mychart for the scheduled virtual therapy visit. Client did not check in for the appointment. Therapist followed up with a tele-phone call to the client. Therapist was unable to reach the client by phone. Therapist left a voicemail with the office phone number for him to call back and reschedule. ?

## 2022-03-19 ENCOUNTER — Other Ambulatory Visit: Payer: Self-pay

## 2022-03-19 ENCOUNTER — Telehealth (HOSPITAL_COMMUNITY): Payer: Self-pay | Admitting: *Deleted

## 2022-03-19 ENCOUNTER — Other Ambulatory Visit (HOSPITAL_COMMUNITY): Payer: Self-pay | Admitting: Physician Assistant

## 2022-03-19 ENCOUNTER — Other Ambulatory Visit (HOSPITAL_COMMUNITY): Payer: Self-pay | Admitting: Psychiatry

## 2022-03-19 DIAGNOSIS — F32A Depression, unspecified: Secondary | ICD-10-CM

## 2022-03-19 MED ORDER — MIRTAZAPINE 30 MG PO TABS
30.0000 mg | ORAL_TABLET | Freq: Every day | ORAL | 0 refills | Status: DC
  Filled 2022-03-19: qty 30, 30d supply, fill #0

## 2022-03-19 NOTE — Progress Notes (Signed)
Provider was contacted by [name] and Dr. Lucianne Muss regarding patient's medication refill.  Patient medication to be e-prescribed to pharmacy of choice. ?

## 2022-03-19 NOTE — Telephone Encounter (Signed)
Provider was contacted by Direce E. McIntyre and Dr. Kumar regarding patient's medication refill.  Patient medication to be e-prescribed to pharmacy of choice. ?

## 2022-03-19 NOTE — Telephone Encounter (Signed)
Patient called requested refill on mirtazapine (REMERON) 30 MG tablet ?Take 1 tablet (30 mg total) by mouth at bedtime  ? ?Patient has  appt with [facility] on 04/06/22 ?

## 2022-03-22 ENCOUNTER — Ambulatory Visit (HOSPITAL_COMMUNITY): Payer: 59 | Admitting: Clinical

## 2022-03-22 ENCOUNTER — Encounter (HOSPITAL_COMMUNITY): Payer: Self-pay

## 2022-03-23 ENCOUNTER — Other Ambulatory Visit: Payer: Self-pay

## 2022-03-29 ENCOUNTER — Emergency Department (HOSPITAL_COMMUNITY)
Admission: EM | Admit: 2022-03-29 | Discharge: 2022-03-29 | Disposition: A | Discharge status: Home / Self Care | Payer: 59 | Attending: Emergency Medicine | Admitting: Emergency Medicine

## 2022-03-29 ENCOUNTER — Encounter (HOSPITAL_COMMUNITY): Payer: Self-pay

## 2022-03-29 ENCOUNTER — Other Ambulatory Visit: Payer: Self-pay

## 2022-03-29 DIAGNOSIS — Y901 Blood alcohol level of 20-39 mg/100 ml: Secondary | ICD-10-CM | POA: Insufficient documentation

## 2022-03-29 DIAGNOSIS — F1010 Alcohol abuse, uncomplicated: Secondary | ICD-10-CM | POA: Diagnosis present

## 2022-03-29 DIAGNOSIS — Z79899 Other long term (current) drug therapy: Secondary | ICD-10-CM | POA: Diagnosis not present

## 2022-03-29 DIAGNOSIS — R000 Tachycardia, unspecified: Secondary | ICD-10-CM | POA: Diagnosis not present

## 2022-03-29 DIAGNOSIS — F419 Anxiety disorder, unspecified: Secondary | ICD-10-CM | POA: Insufficient documentation

## 2022-03-29 LAB — CBC WITH DIFFERENTIAL/PLATELET
Abs Immature Granulocytes: 0.03 10*3/uL (ref 0.00–0.07)
Basophils Absolute: 0.1 10*3/uL (ref 0.0–0.1)
Basophils Relative: 1 %
Eosinophils Absolute: 0 10*3/uL (ref 0.0–0.5)
Eosinophils Relative: 1 %
HCT: 44.3 % (ref 39.0–52.0)
Hemoglobin: 15.6 g/dL (ref 13.0–17.0)
Immature Granulocytes: 1 %
Lymphocytes Relative: 20 %
Lymphs Abs: 1.2 10*3/uL (ref 0.7–4.0)
MCH: 30.8 pg (ref 26.0–34.0)
MCHC: 35.2 g/dL (ref 30.0–36.0)
MCV: 87.4 fL (ref 80.0–100.0)
Monocytes Absolute: 0.5 10*3/uL (ref 0.1–1.0)
Monocytes Relative: 9 %
Neutro Abs: 4 10*3/uL (ref 1.7–7.7)
Neutrophils Relative %: 68 %
Platelets: 216 10*3/uL (ref 150–400)
RBC: 5.07 MIL/uL (ref 4.22–5.81)
RDW: 12.8 % (ref 11.5–15.5)
WBC: 5.8 10*3/uL (ref 4.0–10.5)
nRBC: 0 % (ref 0.0–0.2)

## 2022-03-29 LAB — COMPREHENSIVE METABOLIC PANEL
ALT: 21 U/L (ref 0–44)
AST: 27 U/L (ref 15–41)
Albumin: 4.2 g/dL (ref 3.5–5.0)
Alkaline Phosphatase: 67 U/L (ref 38–126)
Anion gap: 9 (ref 5–15)
BUN: 17 mg/dL (ref 6–20)
CO2: 25 mmol/L (ref 22–32)
Calcium: 9.3 mg/dL (ref 8.9–10.3)
Chloride: 107 mmol/L (ref 98–111)
Creatinine, Ser: 0.89 mg/dL (ref 0.61–1.24)
GFR, Estimated: >60 mL/min (ref >60)
Glucose, Bld: 91 mg/dL (ref 70–99)
Potassium: 4.2 mmol/L (ref 3.5–5.1)
Sodium: 141 mmol/L (ref 135–145)
Total Bilirubin: 0.8 mg/dL (ref 0.3–1.2)
Total Protein: 7.9 g/dL (ref 6.5–8.1)

## 2022-03-29 LAB — ETHANOL: Alcohol, Ethyl (B): 31 mg/dL — ABNORMAL HIGH (ref <10)

## 2022-03-29 MED ORDER — LORAZEPAM 1 MG PO TABS
2.0000 mg | ORAL_TABLET | Freq: Once | ORAL | Status: AC
Start: 2022-03-29 — End: 2022-03-29
  Administered 2022-03-29: 2 mg via ORAL
  Filled 2022-03-29: qty 2

## 2022-03-29 MED ORDER — ONDANSETRON 4 MG PO TBDP
4.0000 mg | ORAL_TABLET | Freq: Once | ORAL | Status: AC
  Administered 2022-03-29: 4 mg via ORAL
  Filled 2022-03-29: qty 1

## 2022-03-29 MED ORDER — CHLORDIAZEPOXIDE HCL 25 MG PO CAPS: ORAL_CAPSULE | ORAL | 0 refills | Status: DC

## 2022-03-29 MED ORDER — SODIUM CHLORIDE 0.9 % IV BOLUS
1000.0000 mL | Freq: Once | INTRAVENOUS | Status: AC
  Administered 2022-03-29: 1000 mL via INTRAVENOUS

## 2022-03-29 MED ORDER — METOPROLOL TARTRATE 5 MG/5ML IV SOLN
5.0000 mg | Freq: Once | INTRAVENOUS | Status: AC
  Administered 2022-03-29: 5 mg via INTRAVENOUS
  Filled 2022-03-29: qty 5

## 2022-03-29 NOTE — ED Provider Notes (Signed)
MSE note.  Patient has history of alcohol abuse.  He has not had anything to drink since yesterday.  He drinks about a dozen beers a day.  Physical exam patient is alert and agitated.  He shaking all over.  Patient is not suicidal or homicidal.  CBC chemistries and EtOH ordered.  Patient given Ativan and Zofran ?  ?Bethann Berkshire, MD ?03/29/22 1013 ? ?

## 2022-03-29 NOTE — ED Provider Notes (Signed)
?[facility]-EMERGENCY DEPT ?Provider Note ? ? ?CSN: [id number] ?Arrival date & time: 03/29/22  0840 ? ?  ? ?History ? ?Chief Complaint  ?Patient presents with  ? Alcohol Problem  ? ? ?[name] is a 31 y.o. male. ? ?Patient complains of anxiety after decreasing his alcohol content today.  Patient has a history of alcohol abuse ? ?The history is provided by the patient and medical records.  ?Alcohol Problem ?This is a recurrent problem. The current episode started 6 to 12 hours ago. The problem occurs constantly. The problem has not changed since onset.Pertinent negatives include no chest pain, no abdominal pain and no headaches. Nothing aggravates the symptoms. He has tried nothing for the symptoms. The treatment provided no relief.  ? ?  ? ?Home Medications ?Prior to Admission medications   ?Medication Sig Start Date End Date Taking? Authorizing Provider  ?chlordiazePOXIDE (LIBRIUM) 25 MG capsule 50mg  PO TID x 1D, then 25-50mg  PO BID X 1D, then 25-50mg  PO QD X 1D 03/29/22  Yes Bethann BerkshireZammit, Gurnie Duris, MD  ?ASHWAGANDHA PO Take 1 capsule by mouth daily.    [provider]  ?ibuprofen (ADVIL) 200 MG tablet Take 200-400 mg by mouth every 6 (six) hours as needed for mild pain or headache.    [provider]  ?levocetirizine (XYZAL) 5 MG tablet Take 5 mg by mouth every evening.    [provider]  ?mirtazapine (REMERON) 30 MG tablet Take 1 tablet (30 mg total) by mouth at bedtime. 03/19/22 03/19/23  Meta HatchetNwoko, Uchenna E, PA  ?naltrexone (DEPADE) 50 MG tablet Take 1 tablet (50 mg total) by mouth daily. ?Patient not taking: Reported on 12/12/2021 12/08/21   Shanna CiscoParsons, Brittney E, NP  ?venlafaxine XR (EFFEXOR-XR) 150 MG 24 hr capsule Take 1 capsule (150 mg total) by mouth daily with breakfast. 12/08/21   Shanna CiscoParsons, Brittney E, NP  ?venlafaxine XR (EFFEXOR-XR) 37.5 MG 24 hr capsule Take 1 capsule (37.5 mg total) by mouth daily with breakfast. ?Patient not taking: Reported on 12/12/2021 12/08/21    Shanna CiscoParsons, Brittney E, NP  ?   ? ?Allergies    ?Benadryl [diphenhydramine] and Codeine   ? ?Review of Systems   ?Review of Systems  ?Constitutional:  Negative for appetite change and fatigue.  ?HENT:  Negative for congestion, ear discharge and sinus pressure.   ?Eyes:  Negative for discharge.  ?Respiratory:  Negative for cough.   ?Cardiovascular:  Negative for chest pain.  ?Gastrointestinal:  Negative for abdominal pain and diarrhea.  ?Genitourinary:  Negative for frequency and hematuria.  ?Musculoskeletal:  Negative for back pain.  ?Skin:  Negative for rash.  ?Neurological:  Negative for seizures and headaches.  ?Psychiatric/Behavioral:  Positive for agitation. Negative for hallucinations.   ? ?Physical Exam ?Updated Vital Signs ?BP (!) 144/115 (BP Location: Left Arm)   Pulse 75   Temp 98.9 ?F (37.2 ?C) (Oral)   Resp 16   Ht 6\' 1"  (1.854 m)   Wt 86.2 kg   SpO2 99%   BMI 25.07 kg/m?  ?Physical Exam ?Vitals and nursing note reviewed.  ?Constitutional:   ?   Appearance: He is well-developed.  ?HENT:  ?   Head: Normocephalic.  ?   Nose: Nose normal.  ?Eyes:  ?   General: No scleral icterus. ?   Conjunctiva/sclera: Conjunctivae normal.  ?Neck:  ?   Thyroid: No thyromegaly.  ?Cardiovascular:  ?   Rate and Rhythm: Regular rhythm. Tachycardia present.  ?   Heart sounds: No murmur heard. ?  No friction rub. No gallop.  ?Pulmonary:  ?   Breath sounds: No stridor. No wheezing or rales.  ?Chest:  ?   Chest wall: No tenderness.  ?Abdominal:  ?   General: There is no distension.  ?   Tenderness: There is no abdominal tenderness. There is no rebound.  ?Musculoskeletal:     ?   General: Normal range of motion.  ?   Cervical back: Neck supple.  ?Lymphadenopathy:  ?   Cervical: No cervical adenopathy.  ?Skin: ?   Findings: No erythema or rash.  ?Neurological:  ?   Mental Status: He is alert and oriented to person, place, and time.  ?   Motor: No abnormal muscle tone.  ?   Coordination: Coordination normal.  ?Psychiatric:  ?    Comments: Patient is anxious but not suicidal or homicidal  ? ? ?ED Results / Procedures / Treatments   ?Labs ?(all labs ordered are listed, but only abnormal results are displayed) ?Labs Reviewed  ?ETHANOL - Abnormal; Notable for the following components:  ?    Result Value  ? Alcohol, Ethyl (B) 31 (*)   ? All other components within normal limits  ?CBC WITH DIFFERENTIAL/PLATELET  ?COMPREHENSIVE METABOLIC PANEL  ? ? ?EKG ?None ? ?Radiology ?No results found. ? ?Procedures ?Procedures  ? ? ?Medications Ordered in ED ?Medications  ?LORazepam (ATIVAN) tablet 2 mg (2 mg Oral Given 03/29/22 1120)  ?ondansetron (ZOFRAN-ODT) disintegrating tablet 4 mg (4 mg Oral Given 03/29/22 1120)  ?sodium chloride 0.9 % bolus 1,000 mL (0 mLs Intravenous Stopped 03/29/22 1205)  ?metoprolol tartrate (LOPRESSOR) injection 5 mg (5 mg Intravenous Given 03/29/22 1120)  ? ? ?ED Course/ Medical Decision Making/ A&P ?  ?                        ?Medical Decision Making ?Amount and/or Complexity of Data Reviewed ?Labs: ordered. ? ?Risk ?Prescription drug management. ? ?This patient presents to the ED for concern of anxiety, this involves an extensive number of treatment options, and is a complaint that carries with it a high risk of complications and morbidity.  The differential diagnosis includes alcohol withdrawal, stress, hypoxia ? ? ?Co morbidities that complicate the patient evaluation ? ?EtOH abuse ? ? ?Additional history obtained: ? ?Additional history obtained from patient ?External records from outside source obtained and reviewed including hospital records ? ? ?Lab Tests: ? ?I Ordered, and personally interpreted labs.  The pertinent results include: CBC chemistries and alcohol unremarkable except mild elevation of alcohol 31 ? ? ?Imaging Studies ordered: ?No x-rays ordered ?Cardiac Monitoring: / EKG: ? ?The patient was maintained on a cardiac monitor.  I personally viewed and interpreted the cardiac monitored which showed an underlying  rhythm of: Sinus tach ? ? ?Consultations Obtained: ? ?No consult ? ?Problem List / ED Course / Critical interventions / Medication management ? ?Alcohol abuse and anxiety ?I ordered medication including normal saline for dehydration and Ativan for anxiety ?Reevaluation of the patient after these medicines showed that the patient improved ?I have reviewed the patients home medicines and have made adjustments as needed ? ? ?Social Determinants of Health: ? ?Alcohol abuse ? ? ?Test / Admission - Considered: ? ?CT head ? ?Patient given Ativan for alcohol abuse and feels better.  He will continue with outpatient treatment is given Librium ? ? ? ? ? ? ? ?Final Clinical Impression(s) / ED Diagnoses ?Final diagnoses:  ?Alcohol abuse  ? ? ?Rx /  DC Orders ?ED Discharge Orders   ? ?      Ordered  ?  chlordiazePOXIDE (LIBRIUM) 25 MG capsule       ? 03/29/22 1303  ? ?  ?  ? ?  ? ? ?  ?Bethann Berkshire, MD ?03/30/22 1732 ? ?

## 2022-03-29 NOTE — ED Notes (Signed)
Pt states understanding of dc instructions, importance of follow up, and prescription. Pt denies questions or concerns and declined transportation assistance upon dc. Pt ambulated w/ a steady gait w/o need for assistance. No belongings left in room upon dc. ? ?

## 2022-03-29 NOTE — ED Triage Notes (Addendum)
Patient states his last drink of alcohol was 10 hours ago. Patient states he is currently experiencing nausea, slight headache, moderate anxiety, and tremors. ? ?Patient reports a history of seizures when withdrawing from alcohol and states the last time was approx 10-11 years ago. ?

## 2022-03-29 NOTE — Discharge Instructions (Signed)
Follow-up with your outpatient counseling in the next couple days. ?

## 2022-03-29 NOTE — ED Notes (Signed)
Pt offered meal tray.  Pt refused. Pt ambulated to restroom w/ steady gait w/out need for assistance. ?

## 2022-04-06 ENCOUNTER — Telehealth (INDEPENDENT_AMBULATORY_CARE_PROVIDER_SITE_OTHER): Payer: 59 | Admitting: Psychiatry

## 2022-04-06 ENCOUNTER — Other Ambulatory Visit: Payer: Self-pay

## 2022-04-06 DIAGNOSIS — F32A Depression, unspecified: Secondary | ICD-10-CM | POA: Diagnosis not present

## 2022-04-06 DIAGNOSIS — F411 Generalized anxiety disorder: Secondary | ICD-10-CM | POA: Diagnosis not present

## 2022-04-06 DIAGNOSIS — F1024 Alcohol dependence with alcohol-induced mood disorder: Secondary | ICD-10-CM

## 2022-04-06 MED ORDER — NALTREXONE HCL 50 MG PO TABS
50.0000 mg | ORAL_TABLET | Freq: Every day | ORAL | 0 refills | Status: DC
  Filled 2022-04-06 – 2022-04-21 (×2): qty 30, 30d supply, fill #0

## 2022-04-06 MED ORDER — MIRTAZAPINE 30 MG PO TABS
30.0000 mg | ORAL_TABLET | Freq: Every day | ORAL | 0 refills | Status: DC
  Filled 2022-04-06 – 2022-04-21 (×2): qty 30, 30d supply, fill #0

## 2022-04-06 MED ORDER — VENLAFAXINE HCL ER 75 MG PO CP24
150.0000 mg | ORAL_CAPSULE | Freq: Every day | ORAL | 0 refills | Status: DC
  Filled 2022-04-06 – 2022-04-21 (×2): qty 30, 15d supply, fill #0

## 2022-04-06 NOTE — Progress Notes (Signed)
Wellness okay I can sleep I can see you clearly so my name is CC I am a nurse practitioner who BH MD/PA/NP OP Progress Note ? ?04/06/2022 4:26 PM ?[name]  ?MRN:  [id number] ? ?Virtual Visit via Video Note ? ?I connected with [name] on 04/06/22 at  4:00 PM EDT by a video enabled telemedicine application and verified that I am speaking with the correct person using two identifiers. ? ?Location: ?Patient: home ?Provider: offsite ?  ?I discussed the limitations of evaluation and management by telemedicine and the availability of in person appointments. The patient expressed understanding and agreed to proceed. ? ?  ?I discussed the assessment and treatment plan with the patient. The patient was provided an opportunity to ask questions and all were answered. The patient agreed with the plan and demonstrated an understanding of the instructions. ?  ?The patient was advised to call back or seek an in-person evaluation if the symptoms worsen or if the condition fails to improve as anticipated. ? ?I provided 20 minutes of non-face-to-face time during this encounter. ? ? ?Mcneil Sober, NP  ? ?Chief Complaint: Medication management ?HPI: [name] is a 31 year old male presenting to [facility] behavioral health outpatient for follow-up psychiatric evaluation.  Patient has a psychiatric history of alcohol dependence, major depressive disorder and generalized anxiety disorder.  His symptoms are managed with Remeron 30 mg at bedtime, naltrexone 50 mg daily, Effexor 150 mg daily.  Patient reports medication noncompliance and a recent relapse causing him to visit emergency services yesterday.  Patient states that he complies with his Effexor and Remeron orders but not naltrexone.  Patient also reports donating blood but being turned away due to high pulse rate.  Patient requesting to taper down on Effexor as he feels like it is ineffective and elevating his pulse rate.  Patient reports financial issues stating  that he was recently fired and also life stressors, stating that his best friend just overdosed.  Patient reports attending therapy at [facility] [place] but missed his last session.  Remeron refilled at current dosage as well as naltrexone.  Effexor decreased to 75 mg daily.  Patient recommended to follow-up primary care provider to manage reported elevated pulse readings.  Patient requested assistance with finding a primary care provider.  Patient agreed to contact his therapist to reschedule an appointment for further symptom management. ? ? ? ?Visit Diagnosis:  ?  ICD-10-CM   ?1. Generalized anxiety disorder  F41.1 venlafaxine XR (EFFEXOR-XR) 75 MG 24 hr capsule  ?  ?2. Mild depression  F32.A venlafaxine XR (EFFEXOR-XR) 75 MG 24 hr capsule  ?  mirtazapine (REMERON) 30 MG tablet  ?  ?3. Alcohol withdrawal, with perceptual disturbance (HCC)  F10.932   ?  ? ? ?Past Psychiatric History:  ? ? ?Past Medical History:  ?Past Medical History:  ?Diagnosis Date  ? Alcohol dependence (HCC)   ? COVID-19   ? Suicidal behavior with attempted self-injury (HCC)   ? No past surgical history on file. ? ?Family Psychiatric History: N/A ? ?Family History:  ?Family History  ?Problem Relation Age of Onset  ? Lupus Mother   ? Hypertension Father   ? ? ?Social History:  ?Social History  ? ?Socioeconomic History  ? Marital status: Single  ?  Spouse name: Not on file  ? Number of children: Not on file  ? Years of education: Not on file  ? Highest education level: Not on file  ?Occupational History  ?  Not on file  ?Tobacco Use  ? Smoking status: Never  ? Smokeless tobacco: Never  ?Vaping Use  ? Vaping Use: Every day  ? Substances: Nicotine, Flavoring  ?Substance and Sexual Activity  ? Alcohol use: Yes  ?  Alcohol/week: 70.0 standard drinks  ?  Types: 70 Standard drinks or equivalent per week  ? Drug use: Not Currently  ?  Types: Benzodiazepines, Cocaine  ? Sexual activity: Not Currently  ?Other Topics Concern  ? Not on file   ?Social History Narrative  ? Not on file  ? ?Social Determinants of Health  ? ?Financial Resource Strain: Not on file  ?Food Insecurity: Not on file  ?Transportation Needs: Not on file  ?Physical Activity: Not on file  ?Stress: Not on file  ?Social Connections: Not on file  ? ? ?Allergies:  ?Allergies  ?Allergen Reactions  ? Benadryl [Diphenhydramine] Anxiety and Other (See Comments)  ?  Increased anxiety  ? Codeine Rash  ? ? ?Metabolic Disorder Labs: ?No results found for: HGBA1C, MPG ?No results found for: PROLACTIN ?No results found for: CHOL, TRIG, HDL, CHOLHDL, VLDL, LDLCALC ?No results found for: TSH ? ?Therapeutic Level Labs: ?No results found for: LITHIUM ?No results found for: VALPROATE ?No components found for:  CBMZ ? ?Current Medications: ?Current Outpatient Medications  ?Medication Sig Dispense Refill  ? ASHWAGANDHA PO Take 1 capsule by mouth daily.    ? chlordiazePOXIDE (LIBRIUM) 25 MG capsule 50mg  PO TID x 1D, then 25-50mg  PO BID X 1D, then 25-50mg  PO QD X 1D 10 capsule 0  ? ibuprofen (ADVIL) 200 MG tablet Take 200-400 mg by mouth every 6 (six) hours as needed for mild pain or headache.    ? levocetirizine (XYZAL) 5 MG tablet Take 5 mg by mouth every evening.    ? mirtazapine (REMERON) 30 MG tablet Take 1 tablet (30 mg total) by mouth at bedtime. 30 tablet 0  ? naltrexone (DEPADE) 50 MG tablet Take 1 tablet (50 mg total) by mouth daily. 30 tablet 0  ? venlafaxine XR (EFFEXOR-XR) 75 MG 24 hr capsule Take 2 capsules (150 mg total) by mouth daily with breakfast. 30 capsule 0  ? ?No current facility-administered medications for this visit.  ? ? ? ?Musculoskeletal: ?Strength & Muscle Tone: N/A virtual visit ?Gait & Station: N/A virtual visit ?NA patient leans: N/A ? ?Psychiatric Specialty Exam: ?Review of Systems  ?There were no vitals taken for this visit.There is no height or weight on file to calculate BMI.  ?General Appearance: Well Groomed  ?Eye Contact:  Good  ?Speech:  Clear and Coherent  ?Volume:   Normal  ?Mood:  Dysphoric  ?Affect:  Congruent  ?Thought Process:  Goal Directed  ?Orientation:  Full (Time, Place, and Person)  ?Thought Content: Logical   ?Suicidal Thoughts: no  ?Homicidal Thoughts:  No  ?Memory: Good  ?Judgement: Good  ?Insight:  Good  ?Psychomotor Activity:  NA  ?Concentration: Good  ?Recall:  Good  ?Fund of Knowledge: Good  ?Language: Good  ?Akathisia:  NA  ?Handed:  Right  ?AIMS (if indicated): not done  ?Assets:  Communication Skills ?Desire for Improvement  ?ADL's:  Intact  ?Cognition: WNL  ?Sleep: Fair  ? ?Screenings: ?AIMS   ? ?Flowsheet Row [facility]  ?AIMS Total Score 0  ? ?  ? ?AUDIT   ? ?Flowsheet Row [facility]  ?Alcohol Use Disorder Identification Test  Final Score (AUDIT) 33  ? ?  ? ?GAD-7   ? ?Flowsheet Row Counselor from 10/27/2021 in [facility] Office Visit from 09/07/2021 in [facility]  ?Total GAD-7 Score 14 14  ? ?  ? ?PHQ2-9   ? ?Flowsheet Row ED from 12/12/2021 in [place] [facility] Counselor from 10/27/2021 in [facility] Office Visit from 09/07/2021 in [facility]  ?PHQ-2 Total Score 2 2 2   ?PHQ-9 Total Score 6 10 10   ? ?  ? ?Flowsheet Row ED from 03/29/2022 in [place] [facility] ED from 02/11/2022 in [facility] ED from 01/12/2022 in [facility]  ?C-SSRS RISK CATEGORY No Risk No Risk No Risk  ? ?  ? ? ? ?Assessment and Plan: Nature: Redmon is a 31 year old male presenting to [facility] behavioral health outpatient for follow-up psychiatric evaluation.  Patient has a psychiatric history of alcohol dependence, major depressive disorder and generalized anxiety disorder.  His symptoms are managed  with Remeron 30 mg at bedtime, naltrexone 50 mg daily, Effexor 150 mg daily.  Patient reports medication noncompliance and a recent relapse causing him to visit emergency services yesterday.  Patient

## 2022-04-06 NOTE — Progress Notes (Signed)
All Danella Penton ?

## 2022-04-07 ENCOUNTER — Other Ambulatory Visit: Payer: Self-pay

## 2022-04-14 ENCOUNTER — Other Ambulatory Visit: Payer: Self-pay

## 2022-04-21 ENCOUNTER — Other Ambulatory Visit: Payer: Self-pay

## 2022-04-22 ENCOUNTER — Other Ambulatory Visit: Payer: Self-pay

## 2022-05-05 ENCOUNTER — Encounter (HOSPITAL_COMMUNITY): Payer: Self-pay | Admitting: Psychiatry

## 2022-05-05 ENCOUNTER — Telehealth (INDEPENDENT_AMBULATORY_CARE_PROVIDER_SITE_OTHER): Payer: 59 | Admitting: Psychiatry

## 2022-05-05 ENCOUNTER — Other Ambulatory Visit: Payer: Self-pay

## 2022-05-05 DIAGNOSIS — F411 Generalized anxiety disorder: Secondary | ICD-10-CM | POA: Diagnosis not present

## 2022-05-05 DIAGNOSIS — F32A Depression, unspecified: Secondary | ICD-10-CM

## 2022-05-05 MED ORDER — VENLAFAXINE HCL ER 75 MG PO CP24
75.0000 mg | ORAL_CAPSULE | Freq: Every day | ORAL | 3 refills | Status: DC
  Filled 2022-05-05 – 2022-05-24 (×2): qty 30, 30d supply, fill #0
  Filled 2022-06-22: qty 30, 30d supply, fill #1

## 2022-05-05 MED ORDER — MIRTAZAPINE 30 MG PO TABS
30.0000 mg | ORAL_TABLET | Freq: Every day | ORAL | 3 refills | Status: DC
  Filled 2022-05-05 – 2022-05-24 (×2): qty 30, 30d supply, fill #0
  Filled 2022-06-22: qty 30, 30d supply, fill #1

## 2022-05-05 NOTE — Progress Notes (Signed)
BH MD/PA/NP OP Progress Note Virtual Visit via Video Note  I connected with [name] on 05/05/22 at 11:30 AM EDT by a video enabled telemedicine application and verified that I am speaking with the correct person using two identifiers.  Location: Patient: Work Provider: Clinic   I discussed the limitations of evaluation and management by telemedicine and the availability of in person appointments. The patient expressed understanding and agreed to proceed.  I provided 30 minutes of non-face-to-face time during this encounter.   05/05/2022 11:35 AM [name]  MRN:  [id number]  Chief Complaint: " I am doing pretty good" HPI: 31 year old male seen today for follow up psychiatric evaluation.  Has a psychiatric history of cocaine dependence, borderline personality, alcohol dependence and depression.  Currently he is managed on mirtazapine 30 mg nightly, Effexor 75 mg daily.  He says medications are effective in managing his psychiatric conditions.  Today he is well-groomed, pleasant, cooperative, and engaged in conversation.  He informed Probation officer that he is doing well.  He notes that he recently got a job at a [employer] and is enjoying his work.  At times he notes that he worries about his finances however reports that he is able to cope with it.  Patient notes that since his discharge from the hospital in March he has been sober from alcohol and other illegal substances.  Today provider conducted a GAD-7 and patient scored an 11.  Provider also conducted PHQ-9 and patient scored a 9.  He endorses adequate sleep and appetite.  Today he denies SI/HI/VAH, meaning, paranoia.    No medication changes made today.  Patient agreeable to continue medications as prescribed.  No other concerns noted at this time.  Visit Diagnosis:    ICD-10-CM   1. Mild depression  F32.A mirtazapine (REMERON) 30 MG tablet    venlafaxine XR (EFFEXOR-XR) 75 MG 24 hr capsule    2. Generalized anxiety disorder   F41.1 venlafaxine XR (EFFEXOR-XR) 75 MG 24 hr capsule      Past Psychiatric History: cocaine dependence, borderline personality, alcohol dependence and depression  Past Medical History:  Past Medical History:  Diagnosis Date   Alcohol dependence ([place])    COVID-19    Suicidal behavior with attempted self-injury [facility])    History reviewed. No pertinent surgical history.  Family Psychiatric History: [name] Asperger and auditory processing disorder, [name] schizophrenia, [name] of family anxiety   Family History:  Family History  Problem Relation Age of Onset   Lupus Mother    Hypertension Father     Social History:  Social History   Socioeconomic History   Marital status: Single    Spouse name: Not on file   Number of children: Not on file   Years of education: Not on file   Highest education level: Not on file  Occupational History   Not on file  Tobacco Use   Smoking status: Never   Smokeless tobacco: Never  Vaping Use   Vaping Use: Every day   Substances: Nicotine, Flavoring  Substance and Sexual Activity   Alcohol use: Yes    Alcohol/week: 70.0 standard drinks    Types: 70 Standard drinks or equivalent per week   Drug use: Not Currently    Types: Benzodiazepines, Cocaine   Sexual activity: Not Currently  Other Topics Concern   Not on file  Social History Narrative   Not on file   Social Determinants of Health   Financial Resource Strain: Not on  file  Food Insecurity: Not on file  Transportation Needs: Not on file  Physical Activity: Not on file  Stress: Not on file  Social Connections: Not on file    Allergies:  Allergies  Allergen Reactions   Benadryl [Diphenhydramine] Anxiety and Other (See Comments)    Increased anxiety   Codeine Rash    Metabolic Disorder Labs: No results found for: HGBA1C, MPG No results found for: PROLACTIN No results found for: CHOL, TRIG, HDL, CHOLHDL, VLDL, LDLCALC No results found for: TSH  Therapeutic  Level Labs: No results found for: LITHIUM No results found for: VALPROATE No components found for:  CBMZ  Current Medications: Current Outpatient Medications  Medication Sig Dispense Refill   ASHWAGANDHA PO Take 1 capsule by mouth daily.     chlordiazePOXIDE (LIBRIUM) 25 MG capsule 50mg  PO TID x 1D, then 25-50mg  PO BID X 1D, then 25-50mg  PO QD X 1D 10 capsule 0   ibuprofen (ADVIL) 200 MG tablet Take 200-400 mg by mouth every 6 (six) hours as needed for mild pain or headache.     levocetirizine (XYZAL) 5 MG tablet Take 5 mg by mouth every evening.     mirtazapine (REMERON) 30 MG tablet Take 1 tablet (30 mg total) by mouth at bedtime. 30 tablet 3   venlafaxine XR (EFFEXOR-XR) 75 MG 24 hr capsule Take 1 capsule (75 mg total) by mouth daily with breakfast. 30 capsule 3   No current facility-administered medications for this visit.     Musculoskeletal: Strength & Muscle Tone: within normal limits and telehealth visit Gait & Station: normal, visit Patient leans: N/A  Psychiatric Specialty Exam: Review of Systems  There were no vitals taken for this visit.There is no height or weight on file to calculate BMI.  General Appearance: Well Groomed  Eye Contact:  Good  Speech:  Clear and Coherent and Normal Rate  Volume:  Normal  Mood:  Euthymic  Affect:  Appropriate and Congruent  Thought Process:  Coherent, Goal Directed, and Linear  Orientation:  Full (Time, Place, and Person)  Thought Content: WDL and Logical   Suicidal Thoughts:  No  Homicidal Thoughts:  No  Memory:  Immediate;   Good Recent;   Good Remote;   Good  Judgement:  Good  Insight:  Good  Psychomotor Activity:  Normal  Concentration:  Concentration: Good and Attention Span: Good  Recall:  Good  Fund of Knowledge: Good  Language: Good  Akathisia:  No  Handed:  Right  AIMS (if indicated): not done  Assets:  Communication Skills Desire for Improvement Financial Resources/Insurance [place] Talents/Skills  ADL's:  Intact  Cognition: WNL  Sleep:  Good   Screenings: AIMS    Flowsheet Row Admission (Discharged) from 04/18/2020 in [place] [facility]  AIMS Total Score 0      AUDIT    Flowsheet Row Admission (Discharged) from 04/18/2020 in [place] [facility]  Alcohol Use Disorder Identification Test Final Score (AUDIT) 33      GAD-7    Flowsheet Row Video Visit from 05/05/2022 in [facility] Counselor from 10/27/2021 in [facility] Office Visit from 09/07/2021 in [facility]  Total GAD-7 Score 11 14 14       PHQ2-9    Flowsheet Row Video Visit from 05/05/2022 in [facility] ED from 12/12/2021 in [place] DEPT Counselor from 10/27/2021 in [place]  [facility] Office Visit from 09/07/2021 in [place]  PHQ-2 Total Score 2 2 2 2   PHQ-9 Total Score 9 6 10 10       Flowsheet Row Video Visit from 05/05/2022 in [facility] ED from 03/29/2022 in [place] DEPT ED from 02/11/2022 in [place] DEPT  C-SSRS RISK CATEGORY No Risk No Risk No Risk        Assessment and Plan: Patient reports that he is doing well on his current medication regimen.  No medication changes made today.  Patient agreeable to continue medication as prescribed.  Collaboration of Care: Collaboration of Care: Other provider involved in patient's care AEB counselor  Patient/Guardian was advised Release of Information must be obtained prior to any record release in order to collaborate their care with an outside provider. Patient/Guardian was advised if they have not already done so to contact the registration department to sign all necessary forms in order for [place] to  release information regarding their care.   Consent: Patient/Guardian gives verbal consent for treatment and assignment of benefits for services provided during this visit. Patient/Guardian expressed understanding and agreed to proceed.   Follow-up in 3 months Follow-up with therapy  Salley Slaughter, NP 05/05/2022, 11:35 AM

## 2022-05-16 ENCOUNTER — Emergency Department (HOSPITAL_COMMUNITY): Payer: 59

## 2022-05-16 ENCOUNTER — Emergency Department (HOSPITAL_COMMUNITY)
Admission: EM | Admit: 2022-05-16 | Discharge: 2022-05-16 | Disposition: A | Discharge status: Home / Self Care | Payer: 59 | Attending: Emergency Medicine | Admitting: Emergency Medicine

## 2022-05-16 ENCOUNTER — Encounter (HOSPITAL_COMMUNITY): Payer: Self-pay

## 2022-05-16 ENCOUNTER — Other Ambulatory Visit: Payer: Self-pay

## 2022-05-16 DIAGNOSIS — R002 Palpitations: Secondary | ICD-10-CM | POA: Diagnosis present

## 2022-05-16 DIAGNOSIS — Y904 Blood alcohol level of 80-99 mg/100 ml: Secondary | ICD-10-CM | POA: Insufficient documentation

## 2022-05-16 DIAGNOSIS — R079 Chest pain, unspecified: Secondary | ICD-10-CM | POA: Insufficient documentation

## 2022-05-16 DIAGNOSIS — F10129 Alcohol abuse with intoxication, unspecified: Secondary | ICD-10-CM | POA: Insufficient documentation

## 2022-05-16 LAB — CBC WITH DIFFERENTIAL/PLATELET
Abs Immature Granulocytes: 0.04 10*3/uL (ref 0.00–0.07)
Basophils Absolute: 0.1 10*3/uL (ref 0.0–0.1)
Basophils Relative: 1 %
Eosinophils Absolute: 0.1 10*3/uL (ref 0.0–0.5)
Eosinophils Relative: 2 %
HCT: 44.1 % (ref 39.0–52.0)
Hemoglobin: 15.1 g/dL (ref 13.0–17.0)
Immature Granulocytes: 1 %
Lymphocytes Relative: 29 %
Lymphs Abs: 1.9 10*3/uL (ref 0.7–4.0)
MCH: 30.3 pg (ref 26.0–34.0)
MCHC: 34.2 g/dL (ref 30.0–36.0)
MCV: 88.4 fL (ref 80.0–100.0)
Monocytes Absolute: 0.7 10*3/uL (ref 0.1–1.0)
Monocytes Relative: 11 %
Neutro Abs: 3.7 10*3/uL (ref 1.7–7.7)
Neutrophils Relative %: 56 %
Platelets: 231 10*3/uL (ref 150–400)
RBC: 4.99 MIL/uL (ref 4.22–5.81)
RDW: 12.2 % (ref 11.5–15.5)
WBC: 6.5 10*3/uL (ref 4.0–10.5)
nRBC: 0 % (ref 0.0–0.2)

## 2022-05-16 LAB — RAPID URINE DRUG SCREEN, HOSP PERFORMED
Amphetamines: POSITIVE — AB
Barbiturates: NOT DETECTED
Benzodiazepines: POSITIVE — AB
Cocaine: NOT DETECTED
Opiates: NOT DETECTED
Tetrahydrocannabinol: NOT DETECTED

## 2022-05-16 LAB — COMPREHENSIVE METABOLIC PANEL
ALT: 21 U/L (ref 0–44)
AST: 32 U/L (ref 15–41)
Albumin: 3.9 g/dL (ref 3.5–5.0)
Alkaline Phosphatase: 48 U/L (ref 38–126)
Anion gap: 11 (ref 5–15)
BUN: 9 mg/dL (ref 6–20)
CO2: 23 mmol/L (ref 22–32)
Calcium: 8.9 mg/dL (ref 8.9–10.3)
Chloride: 106 mmol/L (ref 98–111)
Creatinine, Ser: 0.83 mg/dL (ref 0.61–1.24)
GFR, Estimated: >60 mL/min (ref >60)
Glucose, Bld: 94 mg/dL (ref 70–99)
Potassium: 3.7 mmol/L (ref 3.5–5.1)
Sodium: 140 mmol/L (ref 135–145)
Total Bilirubin: 0.9 mg/dL (ref 0.3–1.2)
Total Protein: 7 g/dL (ref 6.5–8.1)

## 2022-05-16 LAB — ETHANOL: Alcohol, Ethyl (B): 91 mg/dL — ABNORMAL HIGH (ref <10)

## 2022-05-16 LAB — SALICYLATE LEVEL: Salicylate Lvl: <7 mg/dL — ABNORMAL LOW (ref 7.0–30.0)

## 2022-05-16 LAB — ACETAMINOPHEN LEVEL: Acetaminophen (Tylenol), Serum: <10 ug/mL — ABNORMAL LOW (ref 10–30)

## 2022-05-16 MED ORDER — THIAMINE HCL 100 MG/ML IJ SOLN
100.0000 mg | Freq: Every day | INTRAMUSCULAR | Status: DC
Start: 2022-05-16 — End: 2022-05-16

## 2022-05-16 MED ORDER — THIAMINE HCL 100 MG PO TABS: 100.0000 mg | ORAL_TABLET | Freq: Every day | ORAL | Status: DC

## 2022-05-16 MED ORDER — SODIUM CHLORIDE 0.9 % IV BOLUS
1000.0000 mL | Freq: Once | INTRAVENOUS | Status: AC
  Administered 2022-05-16: 1000 mL via INTRAVENOUS

## 2022-05-16 MED ORDER — LORAZEPAM 1 MG PO TABS: 0.0000 mg | ORAL_TABLET | Freq: Four times a day (QID) | ORAL | Status: DC

## 2022-05-16 MED ORDER — LORAZEPAM 2 MG/ML IJ SOLN
1.0000 mg | Freq: Once | INTRAMUSCULAR | Status: AC
  Administered 2022-05-16: 1 mg via INTRAVENOUS
  Filled 2022-05-16: qty 1

## 2022-05-16 MED ORDER — CHLORDIAZEPOXIDE HCL 25 MG PO CAPS
ORAL_CAPSULE | ORAL | 0 refills | Status: DC
Start: 2022-05-16 — End: 2022-06-13

## 2022-05-16 MED ORDER — LORAZEPAM 2 MG/ML IJ SOLN
0.0000 mg | Freq: Four times a day (QID) | INTRAMUSCULAR | Status: DC
  Administered 2022-05-16: 2 mg via INTRAVENOUS
  Filled 2022-05-16: qty 1

## 2022-05-16 MED ORDER — LORAZEPAM 2 MG/ML IJ SOLN: 0.0000 mg | Freq: Two times a day (BID) | INTRAMUSCULAR | Status: DC

## 2022-05-16 MED ORDER — LORAZEPAM 1 MG PO TABS: 0.0000 mg | ORAL_TABLET | Freq: Two times a day (BID) | ORAL | Status: DC

## 2022-05-16 NOTE — ED Triage Notes (Signed)
Ambulatory to ED with c/o ETOH withdrawals and possible ingestion. States he's been on a bender for the last week after being sober for a month, then tonight thought he possibly got his drink laced with something at a party. States last drink was at 2100 after drinking approx. 14 drinks today.

## 2022-05-16 NOTE — ED Provider Notes (Signed)
[facility]-EMERGENCY DEPT [facility] Emergency Department Provider Note MRN:  [id number]  Arrival date & time: 05/16/22     Chief Complaint   Withdrawal and Ingestion   History of Present Illness   [name] is a 31 y.o. year-old male presents to the ED with chief complaint of alcohol withdrawal.  He states that he had a proximately 14 drinks earlier today, last drink was at 9 PM.  He thinks that he is withdrawing or that his drinks were laced with something such as cocaine.  He states that he has central chest pressure and palpitations.  States that he had been sober for the past month, but that over the past week he has been drinking heavily.  History provided by patient.   Review of Systems  Pertinent review of systems noted in HPI.    Physical Exam   Vitals:   05/16/22 0500 05/16/22 0510  BP: (!) 159/115 (!) 159/115  Pulse: (!) 104 (!) 104  Resp: 19   Temp:    SpO2: 96%     CONSTITUTIONAL:  anxious-appearing, NAD NEURO:  Alert and oriented x 3, CN 3-12 grossly intact, mildly tremulous  EYES:  eyes equal and reactive ENT/NECK:  Supple, no stridor  CARDIO:  tachycardic, regular rhythm, appears well-perfused  PULM:  No respiratory distress, CTAB GI/GU:  non-distended,  MSK/SPINE:  No gross deformities, no edema, moves all extremities  SKIN:  no rash, atraumatic   *Additional and/or pertinent findings included in MDM below  Diagnostic and Interventional Summary    EKG Interpretation  Date/Time:    Ventricular Rate:    PR Interval:    QRS Duration:   QT Interval:    QTC Calculation:   R Axis:     Text Interpretation:         Labs Reviewed  SALICYLATE LEVEL - Abnormal; Notable for the following components:      Result Value   Salicylate Lvl <7.0 (*)    All other components within normal limits  ACETAMINOPHEN LEVEL - Abnormal; Notable for the following components:   Acetaminophen (Tylenol), Serum <10 (*)    All other components within normal limits   ETHANOL - Abnormal; Notable for the following components:   Alcohol, Ethyl (B) 91 (*)    All other components within normal limits  RAPID URINE DRUG SCREEN, HOSP PERFORMED - Abnormal; Notable for the following components:   Benzodiazepines POSITIVE (*)    Amphetamines POSITIVE (*)    All other components within normal limits  COMPREHENSIVE METABOLIC PANEL  CBC WITH DIFFERENTIAL/PLATELET  CBG MONITORING, ED    DG Chest Port 1 View  Final Result      Medications  LORazepam (ATIVAN) injection 0-4 mg (2 mg Intravenous Given 05/16/22 0340)    Or  LORazepam (ATIVAN) tablet 0-4 mg ( Oral See Alternative 05/16/22 0340)  LORazepam (ATIVAN) injection 0-4 mg (has no administration in time range)    Or  LORazepam (ATIVAN) tablet 0-4 mg (has no administration in time range)  thiamine tablet 100 mg (has no administration in time range)    Or  thiamine (B-1) injection 100 mg (has no administration in time range)  sodium chloride 0.9 % bolus 1,000 mL (0 mLs Intravenous Stopped 05/16/22 0505)  LORazepam (ATIVAN) injection 1 mg (1 mg Intravenous Given 05/16/22 0511)  sodium chloride 0.9 % bolus 1,000 mL (1,000 mLs Intravenous New Bag/Given 05/16/22 0511)     Procedures  /  Critical Care Procedures  ED Course and Medical  Decision Making  I have reviewed the triage vital signs, the nursing notes, and pertinent available records from the EMR.  Social Determinants Affecting Complexity of Care: Patient suffers from drug abuse/addiction.   ED Course: Clinical Course as of 05/16/22 0556  Sun May 16, 2022  0335 CIWA score 15 [RB]  0513 CIWA score now 6. [RB]    Clinical Course User Index [RB] Roxy Horseman, PA-C   Patient here with palpitations.  Top differential diagnoses include withdrawal, substance abuse. Medical Decision Making Patient here with palpitations.  He thinks that he might be experiencing alcohol withdrawal, but last drink was at about 9 PM.  States that he drinks about 14  drinks per day.  He thinks that one of his drinks might of been laced with something else.  Amount and/or Complexity of Data Reviewed Labs: ordered.    Details: Labs are fairly reassuring.  UDS shows amphetamines. Radiology: ordered.    Details: Chest x-ray shows no obvious abnormality. ECG/medicine tests: ordered and independent interpretation performed.    Details: Sinus tachycardia  Risk OTC drugs. Prescription drug management.     Consultants: No consultations were needed in caring for this patient.   Treatment and Plan: I considered admission due to patient's initial presentation, but after considering the examination and diagnostic results, patient will not require admission and can be discharged with outpatient follow-up.    Final Clinical Impressions(s) / ED Diagnoses     ICD-10-CM   1. Palpitations  R00.2       ED Discharge Orders          Ordered    chlordiazePOXIDE (LIBRIUM) 25 MG capsule        05/16/22 0553              Discharge Instructions Discussed with and Provided to Patient:     Discharge Instructions      Your urine drug screen was positive for amphetamines.  This could contribute to the symptoms that you are experiencing.  Take medications as prescribed.       Roxy Horseman, PA-C 05/16/22 0557    Marily Memos, MD 05/16/22 [phone]

## 2022-05-16 NOTE — Discharge Instructions (Addendum)
Your urine drug screen was positive for amphetamines.  This could contribute to the symptoms that you are experiencing.  Take medications as prescribed.

## 2022-05-24 ENCOUNTER — Other Ambulatory Visit: Payer: Self-pay

## 2022-06-13 ENCOUNTER — Encounter (HOSPITAL_COMMUNITY): Payer: Self-pay

## 2022-06-13 ENCOUNTER — Emergency Department (HOSPITAL_COMMUNITY)
Admission: EM | Admit: 2022-06-13 | Discharge: 2022-06-13 | Disposition: A | Discharge status: Home / Self Care | Payer: 59 | Attending: Emergency Medicine | Admitting: Emergency Medicine

## 2022-06-13 ENCOUNTER — Other Ambulatory Visit: Payer: Self-pay

## 2022-06-13 DIAGNOSIS — F10130 Alcohol abuse with withdrawal, uncomplicated: Secondary | ICD-10-CM | POA: Insufficient documentation

## 2022-06-13 DIAGNOSIS — Y900 Blood alcohol level of less than 20 mg/100 ml: Secondary | ICD-10-CM | POA: Insufficient documentation

## 2022-06-13 DIAGNOSIS — R7401 Elevation of levels of liver transaminase levels: Secondary | ICD-10-CM | POA: Insufficient documentation

## 2022-06-13 DIAGNOSIS — I10 Essential (primary) hypertension: Secondary | ICD-10-CM | POA: Diagnosis not present

## 2022-06-13 DIAGNOSIS — Z79899 Other long term (current) drug therapy: Secondary | ICD-10-CM | POA: Insufficient documentation

## 2022-06-13 DIAGNOSIS — F10930 Alcohol use, unspecified with withdrawal, uncomplicated: Secondary | ICD-10-CM

## 2022-06-13 LAB — COMPREHENSIVE METABOLIC PANEL
ALT: 26 U/L (ref 0–44)
AST: 56 U/L — ABNORMAL HIGH (ref 15–41)
Albumin: 4 g/dL (ref 3.5–5.0)
Alkaline Phosphatase: 49 U/L (ref 38–126)
Anion gap: 9 (ref 5–15)
BUN: 11 mg/dL (ref 6–20)
CO2: 25 mmol/L (ref 22–32)
Calcium: 8.8 mg/dL — ABNORMAL LOW (ref 8.9–10.3)
Chloride: 104 mmol/L (ref 98–111)
Creatinine, Ser: 1.18 mg/dL (ref 0.61–1.24)
GFR, Estimated: >60 mL/min (ref >60)
Glucose, Bld: 102 mg/dL — ABNORMAL HIGH (ref 70–99)
Potassium: 4.2 mmol/L (ref 3.5–5.1)
Sodium: 138 mmol/L (ref 135–145)
Total Bilirubin: 1 mg/dL (ref 0.3–1.2)
Total Protein: 7.4 g/dL (ref 6.5–8.1)

## 2022-06-13 LAB — CBC WITH DIFFERENTIAL/PLATELET
Abs Immature Granulocytes: 0.01 10*3/uL (ref 0.00–0.07)
Basophils Absolute: 0 10*3/uL (ref 0.0–0.1)
Basophils Relative: 1 %
Eosinophils Absolute: 0.1 10*3/uL (ref 0.0–0.5)
Eosinophils Relative: 1 %
HCT: 42.5 % (ref 39.0–52.0)
Hemoglobin: 14.8 g/dL (ref 13.0–17.0)
Immature Granulocytes: 0 %
Lymphocytes Relative: 24 %
Lymphs Abs: 1.4 10*3/uL (ref 0.7–4.0)
MCH: 30.6 pg (ref 26.0–34.0)
MCHC: 34.8 g/dL (ref 30.0–36.0)
MCV: 88 fL (ref 80.0–100.0)
Monocytes Absolute: 0.4 10*3/uL (ref 0.1–1.0)
Monocytes Relative: 7 %
Neutro Abs: 3.9 10*3/uL (ref 1.7–7.7)
Neutrophils Relative %: 67 %
Platelets: 179 10*3/uL (ref 150–400)
RBC: 4.83 MIL/uL (ref 4.22–5.81)
RDW: 12 % (ref 11.5–15.5)
WBC: 5.8 10*3/uL (ref 4.0–10.5)
nRBC: 0 % (ref 0.0–0.2)

## 2022-06-13 LAB — ETHANOL: Alcohol, Ethyl (B): 15 mg/dL — ABNORMAL HIGH (ref <10)

## 2022-06-13 LAB — LIPASE, BLOOD: Lipase: 32 U/L (ref 11–51)

## 2022-06-13 MED ORDER — LORAZEPAM 2 MG/ML IJ SOLN: 0.0000 mg | Freq: Four times a day (QID) | INTRAMUSCULAR | Status: DC

## 2022-06-13 MED ORDER — THIAMINE HCL 100 MG/ML IJ SOLN: 100.0000 mg | Freq: Every day | INTRAMUSCULAR | Status: DC

## 2022-06-13 MED ORDER — THIAMINE HCL 100 MG PO TABS
100.0000 mg | ORAL_TABLET | Freq: Every day | ORAL | Status: DC
  Administered 2022-06-13: 100 mg via ORAL
  Filled 2022-06-13: qty 1

## 2022-06-13 MED ORDER — LORAZEPAM 1 MG PO TABS
0.0000 mg | ORAL_TABLET | Freq: Four times a day (QID) | ORAL | Status: DC
  Administered 2022-06-13: 1 mg via ORAL
  Filled 2022-06-13: qty 1

## 2022-06-13 MED ORDER — LORAZEPAM 1 MG PO TABS: 0.0000 mg | ORAL_TABLET | Freq: Two times a day (BID) | ORAL | Status: DC

## 2022-06-13 MED ORDER — LORAZEPAM 1 MG PO TABS
1.0000 mg | ORAL_TABLET | Freq: Once | ORAL | Status: AC
  Administered 2022-06-13: 1 mg via ORAL
  Filled 2022-06-13: qty 1

## 2022-06-13 MED ORDER — ONDANSETRON 4 MG PO TBDP: 4.0000 mg | ORAL_TABLET | Freq: Three times a day (TID) | ORAL | 0 refills | Status: DC | PRN

## 2022-06-13 MED ORDER — LORAZEPAM 2 MG/ML IJ SOLN: 0.0000 mg | Freq: Two times a day (BID) | INTRAMUSCULAR | Status: DC

## 2022-06-13 MED ORDER — CHLORDIAZEPOXIDE HCL 25 MG PO CAPS: ORAL_CAPSULE | ORAL | 0 refills | Status: DC

## 2022-06-13 NOTE — ED Triage Notes (Signed)
Pt presents with c/o probable alcohol withdrawal. Pt reports he is currently in recovery for alcohol withdrawal but he started drinking approx 2 weeks ago, last drink 14 hours ago. Pt reports some mild abdominal pain at this time. Denies SI.

## 2022-06-13 NOTE — ED Provider Triage Note (Signed)
Emergency Medicine Provider Triage Evaluation Note  [name] , a 31 y.o. male  was evaluated in triage.  Pt complains of EtOH withdrawal. Reports recently has been drinking 10-12 beers daily, last drink approximately 14 hours PTA. Having problems with nausea, vomiting, dry heaves, anxiety, and shakiness.  Review of Systems  Positive: Per above Negative: Hallucinations, seizure activity, SI, HI  Physical Exam  BP (!) 163/107 (BP Location: Right Arm)   Pulse (!) 109   Temp 98.6 F (37 C) (Oral)   Resp 16   Ht 6\' 1"  (1.854 m)   Wt 91.2 kg   SpO2 99%   BMI 26.52 kg/m  Gen:   Awake, no distress   Resp:  Normal effort  MSK:   Moves extremities without difficulty   Medical Decision Making  Medically screening exam initiated at 7:26 AM.  Appropriate orders placed.  [name] was informed that the remainder of the evaluation will be completed by another provider, this initial triage assessment does not replace that evaluation, and the importance of remaining in the ED until their evaluation is complete.  EtOH withdrawal.  CIWA initiated. Will check labs in the setting of vomiting.    [name], PA-C 06/13/22 0730

## 2022-06-13 NOTE — Discharge Instructions (Signed)
Please take Librium as prescribed to help with alcohol withdrawal, symptoms.  Please take Zofran additionally to help as needed for nausea and vomiting.  We have prescribed you new medication(s) today. Discuss the medications prescribed today with your pharmacist as they can have adverse effects and interactions with your other medicines including over the counter and prescribed medications. Seek medical evaluation if you start to experience new or abnormal symptoms after taking one of these medicines, seek care immediately if you start to experience difficulty breathing, feeling of your throat closing, facial swelling, or rash as these could be indications of a more serious allergic reaction  Please follow-up with attached resources.  Return to the ER for any new or worsening symptoms including but not limited to new or worsening pain, fever, inability to keep fluids down, seizure activity, hallucinations, or any other concerns.

## 2022-06-13 NOTE — ED Provider Notes (Signed)
[facility]-EMERGENCY DEPT Provider Note   CSN: [id number] Arrival date & time: 06/13/22  1751     History  Chief Complaint  Patient presents with   Withdrawal    [name] is a 31 y.o. male with a hx of depression, anxiety, & alcohol dependence who presents to the ED with concern for alcohol withdrawal. Patient reports he drinks daily approximately 10-12 beers per day, last drink was approximately 14 hours prior to arrival.  Patient reports he is having problems with anxiety, tremors, nausea, vomiting, dry heaves, and shakiness.  No alleviating or aggravating factors.  He reports he is trying to quit drinking.  He denies SI, HI, hallucinations, seizure activity, chest pain, or shortness of breath.  HPI     Home Medications Prior to Admission medications   Medication Sig Start Date End Date Taking? Authorizing Provider  chlordiazePOXIDE (LIBRIUM) 25 MG capsule 50mg  PO TID x 1D, then 25-50mg  PO BID X 1D, then 25-50mg  PO QD X 1D 05/16/22   07/16/22, PA-C  fluticasone (FLONASE) 50 MCG/ACT nasal spray Place 1 spray into both nostrils 2 (two) times daily as needed for allergies.    [provider]  ibuprofen (ADVIL) 200 MG tablet Take 400 mg by mouth daily.    [provider]  levocetirizine (XYZAL) 5 MG tablet Take 5 mg by mouth every evening.    [provider]  mirtazapine (REMERON) 30 MG tablet Take 1 tablet (30 mg total) by mouth at bedtime. 05/05/22   05/07/22, NP  venlafaxine XR (EFFEXOR-XR) 75 MG 24 hr capsule Take 1 capsule (75 mg total) by mouth daily with breakfast. 05/05/22   05/07/22, NP      Allergies    Benadryl [diphenhydramine] and Codeine    Review of Systems   Review of Systems  Constitutional:  Negative for fever.  Respiratory:  Negative for shortness of breath.   Cardiovascular:  Negative for chest pain.  Gastrointestinal:  Positive for nausea and vomiting. Negative for abdominal  pain.  Neurological:  Positive for tremors. Negative for seizures and syncope.  Psychiatric/Behavioral:  Negative for hallucinations and suicidal ideas. The patient is nervous/anxious.   All other systems reviewed and are negative.   Physical Exam Updated Vital Signs BP (!) 149/108 (BP Location: Left Arm)   Pulse 79   Temp 98.6 F (37 C) (Oral)   Resp 17   Ht 6\' 1"  (1.854 m)   Wt 91.2 kg   SpO2 99%   BMI 26.52 kg/m  Physical Exam Vitals and nursing note reviewed.  Constitutional:      General: He is not in acute distress.    Appearance: He is well-developed. He is not toxic-appearing.  HENT:     Head: Normocephalic and atraumatic.  Eyes:     General:        Right eye: No discharge.        Left eye: No discharge.     Conjunctiva/sclera: Conjunctivae normal.  Cardiovascular:     Rate and Rhythm: Normal rate and regular rhythm.  Pulmonary:     Effort: No respiratory distress.     Breath sounds: Normal breath sounds. No wheezing or rales.  Abdominal:     General: There is no distension.     Palpations: Abdomen is soft.     Tenderness: There is no abdominal tenderness. There is no guarding or rebound.  Musculoskeletal:     Cervical back: Neck supple.  Skin:  General: Skin is warm and dry.  Neurological:     Mental Status: He is alert.     Comments: Clear speech.  No tremors currently.  Psychiatric:        Mood and Affect: Mood normal.        Behavior: Behavior normal.     Comments: Calm, cooperative.    ED Results / Procedures / Treatments   Labs (all labs ordered are listed, but only abnormal results are displayed) Labs Reviewed  COMPREHENSIVE METABOLIC PANEL - Abnormal; Notable for the following components:      Result Value   Glucose, Bld 102 (*)    Calcium 8.8 (*)    AST 56 (*)    All other components within normal limits  ETHANOL - Abnormal; Notable for the following components:   Alcohol, Ethyl (B) 15 (*)    All other components within normal limits   CBC WITH DIFFERENTIAL/PLATELET  LIPASE, BLOOD    EKG None  Radiology No results found.  Procedures Procedures    Medications Ordered in ED Medications  LORazepam (ATIVAN) injection 0-4 mg ( Intravenous See Alternative 06/13/22 0756)    Or  LORazepam (ATIVAN) tablet 0-4 mg (1 mg Oral Given 06/13/22 0756)  LORazepam (ATIVAN) injection 0-4 mg (has no administration in time range)    Or  LORazepam (ATIVAN) tablet 0-4 mg (has no administration in time range)  thiamine tablet 100 mg (has no administration in time range)    Or  thiamine (B-1) injection 100 mg (has no administration in time range)    ED Course/ Medical Decision Making/ A&P                           Medical Decision Making Amount and/or Complexity of Data Reviewed Labs: ordered.  Risk OTC drugs. Prescription drug management.   Patient presents to the ED with complaints of alcohol withdrawal, this involves an extensive number of treatment options, and is a complaint that carries with it a high risk of complications and morbidity. Nontoxic, vitals hypertension and tachycardia on arrival, improved on repeat vital signs as well as my exam.   Additional history obtained:  Chart/nursing notes reviewed  Lab Tests:  I viewed & interpreted labs including:  CBC: Unremarkable CMP: Mild AST elevation Lipase: Within normal limits Ethanol level: Minimally elevated  ED Course:  Labs overall reassuring.  No critical electrolyte derangement or renal dysfunction.  Mildly elevated AST.  Abdominal exam without peritoneal signs, low suspicion for acute surgical process.  Likely symptomatic from alcohol withdrawal.  Initial CIWA 9, given Ativan and states he does feel somewhat improved.  Does not appear to be in DTs.  We discussed options of care including IV placement with fluids and IV medications versus oral medications and subsequent discharge, patient would prefer to receive oral medications.  We will give a dose of Ativan  in the ED additionally discharged home with Librium taper and resources. I discussed results, treatment plan, need for follow-up, and return precautions with the patient. Provided opportunity for questions, patient confirmed understanding and is in agreement with plan.    Based on patient's chief complaint, I considered admission might be necessary, however after reassuring ED workup feel patient is reasonable for discharge.   Portions of this note were generated with Scientist, clinical (histocompatibility and immunogenetics). Dictation errors may occur despite best attempts at proofreading.         Final Clinical Impression(s) / ED Diagnoses Final diagnoses:  Alcohol withdrawal syndrome without complication ([place])    Rx / DC Orders ED Discharge Orders          Ordered    chlordiazePOXIDE (LIBRIUM) 25 MG capsule        06/13/22 1145    ondansetron (ZOFRAN-ODT) 4 MG disintegrating tablet  Every 8 hours PRN        06/13/22 1146              Isaih Bulger, Zaleski R, PA-C 06/13/22 1413    [place], Quita Skye, DO 06/13/22 1440

## 2022-06-15 ENCOUNTER — Encounter (HOSPITAL_COMMUNITY): Payer: Self-pay

## 2022-06-15 ENCOUNTER — Emergency Department (HOSPITAL_COMMUNITY)
Admission: EM | Admit: 2022-06-15 | Discharge: 2022-06-15 | Disposition: A | Discharge status: Home / Self Care | Payer: 59 | Attending: Emergency Medicine | Admitting: Emergency Medicine

## 2022-06-15 ENCOUNTER — Other Ambulatory Visit: Payer: Self-pay

## 2022-06-15 DIAGNOSIS — F10939 Alcohol use, unspecified with withdrawal, unspecified: Secondary | ICD-10-CM

## 2022-06-15 DIAGNOSIS — F10930 Alcohol use, unspecified with withdrawal, uncomplicated: Secondary | ICD-10-CM | POA: Diagnosis not present

## 2022-06-15 DIAGNOSIS — R251 Tremor, unspecified: Secondary | ICD-10-CM | POA: Diagnosis present

## 2022-06-15 MED ORDER — CHLORDIAZEPOXIDE HCL 25 MG PO CAPS: 25.0000 mg | ORAL_CAPSULE | Freq: Three times a day (TID) | ORAL | 0 refills | Status: DC | PRN

## 2022-06-15 NOTE — ED Triage Notes (Signed)
Pt reports being seen on Sunday for alcohol withdrawals. Pt states that he is taking the medications prescribed but he woke up today with a headache, tremors, and anxiety.

## 2022-06-15 NOTE — ED Notes (Signed)
Pt ambulatory without assistance.  

## 2022-06-15 NOTE — Discharge Instructions (Addendum)
You were seen today for alcohol withdrawal symptoms. I have extended your librium prescription for an additional 3 days. You may take the medication as needed up to 3 times daily. Please continue to use your outpatient resources for your addiction treatment.

## 2022-06-15 NOTE — ED Provider Notes (Signed)
[facility]-EMERGENCY DEPT Provider Note   CSN: [id number] Arrival date & time: 06/15/22  1540     History  Chief Complaint  Patient presents with   Alcohol Problem    [name] is a 31 y.o. male.  Patient presents to the hospital this morning complaining of alcohol withdrawal symptoms.  Patient was seen at this ER on July night and prescribed a 2-day taper of Librium.  Patient states he has used a Librium taper but continues to have tremors and believes he needs an extension of the prescription.  Patient states that since coming Sunday he has gone to 2 Alcoholics Anonymous meetings, has scheduled a meeting with his addiction counselor at behavioral health urgent care.  Patient's main symptom is anxiety and mild tremors.  Patient also endorses mild nausea.  Denies abdominal pain, vomiting, shortness of breath, seizures.  Past medical history significant for alcohol dependence, history of suicidal behavior with attempted self injury, generalized anxiety disorder, major depressive disorder  HPI     Home Medications Prior to Admission medications   Medication Sig Start Date End Date Taking? Authorizing Provider  chlordiazePOXIDE (LIBRIUM) 25 MG capsule Take 1 capsule (25 mg total) by mouth 3 (three) times daily as needed for up to 3 days for anxiety. 06/15/22 06/18/22 Yes Cynitha Berte, Dorisann Frames, PA-C  fluticasone (FLONASE) 50 MCG/ACT nasal spray Place 1 spray into both nostrils 2 (two) times daily as needed for allergies.    [provider]  ibuprofen (ADVIL) 200 MG tablet Take 400 mg by mouth daily.    [provider]  levocetirizine (XYZAL) 5 MG tablet Take 5 mg by mouth every evening.    [provider]  mirtazapine (REMERON) 30 MG tablet Take 1 tablet (30 mg total) by mouth at bedtime. 05/05/22   Shanna Cisco, NP  ondansetron (ZOFRAN-ODT) 4 MG disintegrating tablet Take 1 tablet (4 mg total) by mouth every 8 (eight) hours as needed for  nausea or vomiting. 06/13/22   Petrucelli, Pleas Koch, PA-C  venlafaxine XR (EFFEXOR-XR) 75 MG 24 hr capsule Take 1 capsule (75 mg total) by mouth daily with breakfast. 05/05/22   Shanna Cisco, NP      Allergies    Benadryl [diphenhydramine] and Codeine    Review of Systems   Review of Systems  Constitutional:  Negative for fever.  Respiratory:  Negative for shortness of breath.   Cardiovascular:  Negative for chest pain.  Gastrointestinal:  Positive for nausea. Negative for abdominal pain and vomiting.  Neurological:  Positive for tremors.    Physical Exam Updated Vital Signs BP (!) 158/110   Pulse (!) 110   Temp 98 F (36.7 C) (Oral)   Resp 16   Ht 6\' 1"  (1.854 m)   Wt 90.7 kg   SpO2 100%   BMI 26.39 kg/m  Physical Exam Vitals and nursing note reviewed.  Constitutional:      General: He is not in acute distress. HENT:     Head: Normocephalic and atraumatic.     Mouth/Throat:     Mouth: Mucous membranes are moist.  Eyes:     Conjunctiva/sclera: Conjunctivae normal.  Cardiovascular:     Rate and Rhythm: Normal rate and regular rhythm.     Pulses: Normal pulses.     Heart sounds: Normal heart sounds.  Pulmonary:     Effort: Pulmonary effort is normal.     Breath sounds: Normal breath sounds.  Abdominal:     Palpations:  Abdomen is soft.     Tenderness: There is no abdominal tenderness.  Musculoskeletal:        General: Normal range of motion.     Cervical back: Normal range of motion.  Skin:    General: Skin is warm and dry.  Neurological:     Mental Status: He is alert.     Motor: Tremor present.  Psychiatric:     Comments: Patient appears anxious     ED Results / Procedures / Treatments   Labs (all labs ordered are listed, but only abnormal results are displayed) Labs Reviewed - No data to display   EKG None  Radiology No results found.  Procedures Procedures    Medications Ordered in ED Medications - No data to display  ED Course/  Medical Decision Making/ A&P                           Medical Decision Making Amount and/or Complexity of Data Reviewed Labs: ordered.   Patient presents with a chief complaint of tremors secondary to alcohol withdrawal symptoms.  The patient was prescribed a two day course of librium on 7/9 which was scheduled to be finished by now. I believe it is reasonable to continue the medication for a 5 day total course.   Plan to discharge patient with 3 additional days of librium to complete a full taper. Patient will continue to utilize outpatient resources for help with addiction         Final Clinical Impression(s) / ED Diagnoses Final diagnoses:  Alcohol withdrawal syndrome with complication [facility])    Rx / DC Orders ED Discharge Orders          Ordered    chlordiazePOXIDE (LIBRIUM) 25 MG capsule  3 times daily PRN        06/15/22 0731              Darrick Grinder, PA-C 06/15/22 0732    Wynetta Fines, MD 06/18/22 [phone]

## 2022-06-22 ENCOUNTER — Other Ambulatory Visit: Payer: Self-pay

## 2022-07-01 ENCOUNTER — Encounter (HOSPITAL_COMMUNITY): Payer: Self-pay

## 2022-07-01 ENCOUNTER — Other Ambulatory Visit: Payer: Self-pay

## 2022-07-01 ENCOUNTER — Emergency Department (HOSPITAL_COMMUNITY)
Admission: EM | Admit: 2022-07-01 | Discharge: 2022-07-01 | Disposition: A | Discharge status: Home / Self Care | Payer: 59 | Source: Home / Self Care | Attending: Emergency Medicine | Admitting: Emergency Medicine

## 2022-07-01 ENCOUNTER — Inpatient Hospital Stay (HOSPITAL_COMMUNITY)
Admission: EM | Admit: 2022-07-01 | Discharge: 2022-07-04 | DRG: 897 | Disposition: A | Discharge status: Home / Self Care | Payer: 59 | Attending: Internal Medicine | Admitting: Internal Medicine

## 2022-07-01 DIAGNOSIS — R112 Nausea with vomiting, unspecified: Secondary | ICD-10-CM | POA: Insufficient documentation

## 2022-07-01 DIAGNOSIS — Z79899 Other long term (current) drug therapy: Secondary | ICD-10-CM | POA: Insufficient documentation

## 2022-07-01 DIAGNOSIS — Z888 Allergy status to other drugs, medicaments and biological substances status: Secondary | ICD-10-CM

## 2022-07-01 DIAGNOSIS — R61 Generalized hyperhidrosis: Secondary | ICD-10-CM | POA: Insufficient documentation

## 2022-07-01 DIAGNOSIS — F419 Anxiety disorder, unspecified: Secondary | ICD-10-CM | POA: Insufficient documentation

## 2022-07-01 DIAGNOSIS — F329 Major depressive disorder, single episode, unspecified: Secondary | ICD-10-CM | POA: Diagnosis present

## 2022-07-01 DIAGNOSIS — Y903 Blood alcohol level of 60-79 mg/100 ml: Secondary | ICD-10-CM | POA: Insufficient documentation

## 2022-07-01 DIAGNOSIS — J309 Allergic rhinitis, unspecified: Secondary | ICD-10-CM | POA: Diagnosis present

## 2022-07-01 DIAGNOSIS — F1010 Alcohol abuse, uncomplicated: Secondary | ICD-10-CM | POA: Diagnosis not present

## 2022-07-01 DIAGNOSIS — F411 Generalized anxiety disorder: Secondary | ICD-10-CM | POA: Diagnosis present

## 2022-07-01 DIAGNOSIS — Z885 Allergy status to narcotic agent status: Secondary | ICD-10-CM

## 2022-07-01 DIAGNOSIS — R7401 Elevation of levels of liver transaminase levels: Secondary | ICD-10-CM | POA: Insufficient documentation

## 2022-07-01 DIAGNOSIS — R519 Headache, unspecified: Secondary | ICD-10-CM | POA: Insufficient documentation

## 2022-07-01 DIAGNOSIS — F10231 Alcohol dependence with withdrawal delirium: Principal | ICD-10-CM | POA: Diagnosis present

## 2022-07-01 DIAGNOSIS — F10930 Alcohol use, unspecified with withdrawal, uncomplicated: Secondary | ICD-10-CM | POA: Diagnosis not present

## 2022-07-01 DIAGNOSIS — R251 Tremor, unspecified: Secondary | ICD-10-CM | POA: Insufficient documentation

## 2022-07-01 DIAGNOSIS — R030 Elevated blood-pressure reading, without diagnosis of hypertension: Secondary | ICD-10-CM | POA: Diagnosis present

## 2022-07-01 DIAGNOSIS — F1020 Alcohol dependence, uncomplicated: Principal | ICD-10-CM

## 2022-07-01 DIAGNOSIS — F10239 Alcohol dependence with withdrawal, unspecified: Secondary | ICD-10-CM | POA: Diagnosis present

## 2022-07-01 DIAGNOSIS — F10229 Alcohol dependence with intoxication, unspecified: Secondary | ICD-10-CM | POA: Diagnosis present

## 2022-07-01 DIAGNOSIS — F10939 Alcohol use, unspecified with withdrawal, unspecified: Secondary | ICD-10-CM | POA: Diagnosis present

## 2022-07-01 DIAGNOSIS — Z8616 Personal history of COVID-19: Secondary | ICD-10-CM

## 2022-07-01 DIAGNOSIS — Z8249 Family history of ischemic heart disease and other diseases of the circulatory system: Secondary | ICD-10-CM

## 2022-07-01 LAB — RAPID URINE DRUG SCREEN, HOSP PERFORMED
Amphetamines: NOT DETECTED
Barbiturates: NOT DETECTED
Benzodiazepines: POSITIVE — AB
Cocaine: NOT DETECTED
Opiates: NOT DETECTED
Tetrahydrocannabinol: NOT DETECTED

## 2022-07-01 LAB — BASIC METABOLIC PANEL
Anion gap: 10 (ref 5–15)
BUN: 17 mg/dL (ref 6–20)
CO2: 24 mmol/L (ref 22–32)
Calcium: 8.4 mg/dL — ABNORMAL LOW (ref 8.9–10.3)
Chloride: 105 mmol/L (ref 98–111)
Creatinine, Ser: 1.1 mg/dL (ref 0.61–1.24)
GFR, Estimated: >60 mL/min (ref >60)
Glucose, Bld: 99 mg/dL (ref 70–99)
Potassium: 4.3 mmol/L (ref 3.5–5.1)
Sodium: 139 mmol/L (ref 135–145)

## 2022-07-01 LAB — CBC
HCT: 44.3 % (ref 39.0–52.0)
Hemoglobin: 15 g/dL (ref 13.0–17.0)
MCH: 30.2 pg (ref 26.0–34.0)
MCHC: 33.9 g/dL (ref 30.0–36.0)
MCV: 89.1 fL (ref 80.0–100.0)
Platelets: 232 10*3/uL (ref 150–400)
RBC: 4.97 MIL/uL (ref 4.22–5.81)
RDW: 12.6 % (ref 11.5–15.5)
WBC: 4 10*3/uL (ref 4.0–10.5)
nRBC: 0 % (ref 0.0–0.2)

## 2022-07-01 LAB — ETHANOL
Alcohol, Ethyl (B): 77 mg/dL — ABNORMAL HIGH (ref <10)
Alcohol, Ethyl (B): 228 mg/dL — ABNORMAL HIGH (ref <10)

## 2022-07-01 LAB — HEPATIC FUNCTION PANEL
ALT: 37 U/L (ref 0–44)
AST: 69 U/L — ABNORMAL HIGH (ref 15–41)
Albumin: 3.7 g/dL (ref 3.5–5.0)
Alkaline Phosphatase: 56 U/L (ref 38–126)
Bilirubin, Direct: 0.2 mg/dL (ref 0.0–0.2)
Indirect Bilirubin: 0.9 mg/dL (ref 0.3–0.9)
Total Bilirubin: 1.1 mg/dL (ref 0.3–1.2)
Total Protein: 6.8 g/dL (ref 6.5–8.1)

## 2022-07-01 MED ORDER — LORAZEPAM 2 MG/ML IJ SOLN: 0.0000 mg | Freq: Two times a day (BID) | INTRAMUSCULAR | Status: DC

## 2022-07-01 MED ORDER — ACETAMINOPHEN 325 MG PO TABS: 650.0000 mg | ORAL_TABLET | Freq: Four times a day (QID) | ORAL | Status: DC | PRN

## 2022-07-01 MED ORDER — ACETAMINOPHEN 650 MG RE SUPP: 650.0000 mg | Freq: Four times a day (QID) | RECTAL | Status: DC | PRN

## 2022-07-01 MED ORDER — THIAMINE HCL 100 MG/ML IJ SOLN: 100.0000 mg | Freq: Every day | INTRAMUSCULAR | Status: DC

## 2022-07-01 MED ORDER — LORAZEPAM 1 MG PO TABS
1.0000 mg | ORAL_TABLET | Freq: Once | ORAL | Status: AC
  Administered 2022-07-01: 1 mg via ORAL
  Filled 2022-07-01: qty 1

## 2022-07-01 MED ORDER — LORAZEPAM 1 MG PO TABS
0.0000 mg | ORAL_TABLET | Freq: Two times a day (BID) | ORAL | Status: DC
  Administered 2022-07-04: 2 mg via ORAL
  Filled 2022-07-01: qty 2

## 2022-07-01 MED ORDER — LORAZEPAM 1 MG PO TABS
0.0000 mg | ORAL_TABLET | Freq: Four times a day (QID) | ORAL | Status: AC
  Administered 2022-07-01 – 2022-07-02 (×3): 1 mg via ORAL
  Administered 2022-07-02: 2 mg via ORAL
  Administered 2022-07-02: 1 mg via ORAL
  Administered 2022-07-03 (×2): 2 mg via ORAL
  Filled 2022-07-01: qty 1
  Filled 2022-07-01 (×2): qty 2
  Filled 2022-07-01 (×2): qty 1
  Filled 2022-07-01: qty 2
  Filled 2022-07-01: qty 1

## 2022-07-01 MED ORDER — PROSIGHT PO TABS
1.0000 | ORAL_TABLET | Freq: Every day | ORAL | Status: DC
  Administered 2022-07-02 – 2022-07-04 (×3): 1 via ORAL
  Filled 2022-07-01 (×3): qty 1

## 2022-07-01 MED ORDER — FOLIC ACID 1 MG PO TABS
1.0000 mg | ORAL_TABLET | Freq: Every day | ORAL | Status: DC
  Administered 2022-07-02 – 2022-07-04 (×3): 1 mg via ORAL
  Filled 2022-07-01 (×3): qty 1

## 2022-07-01 MED ORDER — LORAZEPAM 2 MG/ML IJ SOLN
0.0000 mg | Freq: Four times a day (QID) | INTRAMUSCULAR | Status: AC
  Administered 2022-07-02: 2 mg via INTRAVENOUS
  Filled 2022-07-01: qty 1

## 2022-07-01 MED ORDER — THIAMINE HCL 100 MG/ML IJ SOLN
Freq: Once | INTRAVENOUS | Status: AC
  Filled 2022-07-01: qty 1000

## 2022-07-01 MED ORDER — THIAMINE HCL 100 MG PO TABS
100.0000 mg | ORAL_TABLET | Freq: Every day | ORAL | Status: DC
  Administered 2022-07-01 – 2022-07-04 (×4): 100 mg via ORAL
  Filled 2022-07-01 (×4): qty 1

## 2022-07-01 MED ORDER — LORAZEPAM 2 MG/ML IJ SOLN
1.0000 mg | Freq: Once | INTRAMUSCULAR | Status: AC
  Administered 2022-07-01: 1 mg via INTRAVENOUS
  Filled 2022-07-01: qty 1

## 2022-07-01 NOTE — ED Notes (Signed)
Reviewed written and verbal d/c instructions. Pt hypertensive at time of d/c. Dr. Stevie Kern aware of elevated BP. Pt cleared for d/c per Dr. Stevie Kern orders.

## 2022-07-01 NOTE — H&P (Signed)
History and Physical    PLEASE NOTE THAT DRAGON DICTATION SOFTWARE WAS USED IN THE CONSTRUCTION OF THIS NOTE.   [name] JKK:[id number] DOB: 03-Nov-1991 DOA: 07/01/2022  PCP: Pcp, No (will further assess Patient coming from: home   I have personally briefly reviewed patient's old medical records in [facility]  Chief Complaint: anxiety  HPI: [name] is a 31 y.o. male with medical history significant for chronic alcohol abuse who is admitted to [facility] on 07/01/2022 with acute alcohol withdrawal after presenting from home to [facility] ED complaining of anxiety.   The patient endorses a history of chronic alcohol abuse, noting that he typically consumes at least six to severe 8.5 oz malt beverages per day, with each malt beverage containing approximately 16% alcohol by volume.  He notes that he has been consuming alcohol on a daily basis at this volume for well over a year.  He conveys that he has attempted multiple times to stop drinking in the past, and notes that he has experienced alcohol withdrawal seizures with these attempts, most recently approximately 1 year ago.   He had presented to [facility] emergency department earlier in the day on 07/01/2022 with concern regarding early alcohol withdrawal symptoms and conveying his desire to completely stop alcohol consumption.  He received 2 doses of Ativan and was subsequently discharged home from the ED. patient conveys that he was not discharged on any prn or scheduled benzodiazepines.   He continues to conveys desire to stop alcohol consumption, but also conveys anxiety regarding his increased risk for alcohol withdrawal seizures, particularly given a history of such.  Denies that after he was discharged to home from his Emergency room earlier in the day, he did he consumed malt beverages at the above volume, with most recent alcohol consumption occurring on the early afternoon of 07/01/2022.  He reports feeling slightly  "shaky" at this time.  Denies any associated chest pain, shortness of breath, dizziness, presyncope, syncope.  No associated nausea/vomiting nor any recent abdominal pain, diarrhea.  Denies any visual or auditory hallucinations at this time.     [facility] ED Course:  Vital signs in the ED were notable for the following: Afebrile; heart rate 73-83; blood pressure 138/107, respiratory rate 14-19, oxygen saturation 95 to 100% room air.  Labs were notable for the following: CMP notable for the following: Creatinine 1.1, AST 69, ALT 37, alkaline phosphatase/T. bili within normal limits.  CBC notable for white blood cell count 4000.  Serum ethanol level 228.  Urinary drug screen positive for benzodiazepines.  While in the ED, the following were administered: Ativan 1 mg p.o. x1, thiamine 100 mg p.o. x1.   Subsequently, the patient was admitted for further evaluation and management of early alcohol withdrawal in this patient with a history of chronic alcohol abuse to verbalizes his desire to stop drinking.   Review of Systems: As per HPI otherwise 10 point review of systems negative.   Past Medical History:  Diagnosis Date   Alcohol dependence (HCC)    COVID-19    Suicidal behavior with attempted self-injury [facility])     History reviewed. No pertinent surgical history.  Social History:  reports that he has never smoked. He has never used smokeless tobacco. He reports current alcohol use of about 70.0 standard drinks of alcohol per week. He reports that he does not currently use drugs after having used the following drugs: Benzodiazepines and Cocaine.   Allergies  Allergen Reactions  Benadryl [Diphenhydramine] Anxiety and Other (See Comments)    Increased anxiety   Codeine Nausea And Vomiting and Rash    Family History  Problem Relation Age of Onset   Lupus Mother    Hypertension Father     Family history reviewed and not pertinent    Prior to Admission medications   Medication  Sig Start Date End Date Taking? Authorizing Provider  chlordiazePOXIDE (LIBRIUM) 25 MG capsule Take 1 capsule (25 mg total) by mouth 3 (three) times daily as needed for up to 3 days for anxiety. Patient not taking: Reported on 07/01/2022 06/15/22 06/18/22  Barrie Dunker B, PA-C  fluticasone [facility]) 50 MCG/ACT nasal spray Place 1 spray into both nostrils 2 (two) times daily as needed for allergies.    [provider]  ibuprofen (ADVIL) 200 MG tablet Take 400 mg by mouth daily.    [provider]  levocetirizine (XYZAL) 5 MG tablet Take 5 mg by mouth every evening.    [provider]  mirtazapine (REMERON) 30 MG tablet Take 1 tablet (30 mg total) by mouth at bedtime. 05/05/22   Shanna Cisco, NP  ondansetron (ZOFRAN-ODT) 4 MG disintegrating tablet Take 1 tablet (4 mg total) by mouth every 8 (eight) hours as needed for nausea or vomiting. 06/13/22   Petrucelli, Pleas Koch, PA-C  venlafaxine XR (EFFEXOR-XR) 75 MG 24 hr capsule Take 1 capsule (75 mg total) by mouth daily with breakfast. 05/05/22   Shanna Cisco, NP     Objective    Physical Exam: Vitals:   07/01/22 1914 07/01/22 2000 07/01/22 2123  BP: (!) 158/118 (!) 153/113 (!) 138/107  Pulse: (!) 106 95 88  Resp: 19  16  Temp: 99.2 F (37.3 C)  98 F (36.7 C)  TempSrc: Oral  Oral  SpO2: 100%  97%    General: appears to be stated age; alert, oriented Skin: warm, dry, no rash Head:  AT/ Mouth:  Oral mucosa membranes appear moist, normal dentition Neck: supple; trachea midline Heart:  RRR; did not appreciate any M/R/G Lungs: CTAB, did not appreciate any wheezes, rales, or rhonchi Abdomen: + BS; soft, ND, NT Vascular: 2+ pedal pulses b/l; 2+ radial pulses b/l Extremities: no peripheral edema, no muscle wasting Neuro: strength and sensation intact in upper and lower extremities b/l      Labs on Admission: I have personally reviewed following labs and imaging studies  CBC: Recent Labs  Lab  07/01/22 0741  WBC 4.0  HGB 15.0  HCT 44.3  MCV 89.1  PLT 232   Basic Metabolic Panel: Recent Labs  Lab 07/01/22 0741  NA 139  K 4.3  CL 105  CO2 24  GLUCOSE 99  BUN 17  CREATININE 1.10  CALCIUM 8.4*   GFR: CrCl cannot be calculated (Unknown ideal weight.). Liver Function Tests: Recent Labs  Lab 07/01/22 0831  AST 69*  ALT 37  ALKPHOS 56  BILITOT 1.1  PROT 6.8  ALBUMIN 3.7   No results for input(s): "LIPASE", "AMYLASE" in the last 168 hours. No results for input(s): "AMMONIA" in the last 168 hours. Coagulation Profile: No results for input(s): "INR", "PROTIME" in the last 168 hours. Cardiac Enzymes: No results for input(s): "CKTOTAL", "CKMB", "CKMBINDEX", "TROPONINI" in the last 168 hours. BNP (last 3 results) No results for input(s): "PROBNP" in the last 8760 hours. HbA1C: No results for input(s): "HGBA1C" in the last 72 hours. CBG: No results for input(s): "GLUCAP" in the last 168 hours. Lipid Profile: No  results for input(s): "CHOL", "HDL", "LDLCALC", "TRIG", "CHOLHDL", "LDLDIRECT" in the last 72 hours. Thyroid Function Tests: No results for input(s): "TSH", "T4TOTAL", "FREET4", "T3FREE", "THYROIDAB" in the last 72 hours. Anemia Panel: No results for input(s): "VITAMINB12", "FOLATE", "FERRITIN", "TIBC", "IRON", "RETICCTPCT" in the last 72 hours. Urine analysis: No results found for: "COLORURINE", "APPEARANCEUR", "LABSPEC", "PHURINE", "GLUCOSEU", "HGBUR", "BILIRUBINUR", "KETONESUR", "PROTEINUR", "UROBILINOGEN", "NITRITE", "LEUKOCYTESUR"  Radiological Exams on Admission: No results found.    Assessment/Plan    Principal Problem:   Alcohol withdrawal (HCC) Active Problems:   Allergic rhinitis   Chronic alcohol abuse       #) Alcohol withdrawal: In the setting of a history of chronic alcohol abuse, with daily alcohol consumption with typical volume as quantified above, the patient presents to the emergency department verbalizing his desire to  stop drinking, with clinical evidence to suggest early alcohol withdrawal in the form of patient's anxiety, mild hypertension, evidence of tremors, now noting most recent alcohol consumed on the afternoon of 07/01/2022.  History notable for prior alcohol withdrawal seizures with previous attempts to stop drinking, most recently approximately 1 year ago.  No evidence of seizures this evening.  Given this history, patient certainly at risk for ensuing development of DT's. Will closely monitor for evidence of further progression of alcohol withdrawal/DTS with initiation of CIWA protocol and along with provision of additional resources via transitional care consult, as further detailed below.  Status post dose of thiamine administered in the ED this evening.  Plan: counseled the patient on the importance of reduction in alcohol consumption. Consult to transition of care team placed. Close monitoring of ensuing BP and HR via routine VS. Symptoms-based CIWA protocol with prn Ativan ordered. Seizure precautions. Telemetry. Add-on serum Mg level. Check serum phosphorus level. Repeat CMP in the morning. Check INR. Banana bag x1 followed by initiation of daily oral supplementation via multivitamin, folic acid, and thiamine.         #) Allergic Rhinitis: documented h/o such, on scheduled Xyzal and prn intranasal Flonase as outpatient.    Plan: Continue home Xyzal, along with consideration for scheduling intranasal Flonase as the latter represents first-line intervention for allergic rhinitis.        DVT prophylaxis: SCD's   Code Status: Full code Family Communication: none Disposition Plan: Per Rounding Team Consults called: none;  Admission status: Observation   PLEASE NOTE THAT DRAGON DICTATION SOFTWARE WAS USED IN THE CONSTRUCTION OF THIS NOTE.   Chaney Born Tenecia Ignasiak DO [facility] From 7PM - 7AM   07/01/2022, 10:15 PM

## 2022-07-01 NOTE — ED Provider Notes (Signed)
[facility]-EMERGENCY DEPT Provider Note   CSN: [id number] Arrival date & time: 07/01/22  1851     History  Chief Complaint  Patient presents with   Alcohol Problem    [name] is a 31 y.o. male who presents to the emergency department with a chief complaint of alcohol dependence.  Patient was seen earlier today.  He denies any suicidal complaints.  Patient states that he has been drinking very heavily daily for approximately 6 months.  Drinks at least 6 or 7 high alcohol per volume malt liquor daily and states that he drinks in between.  He drinks pretty much all day and states that when he stops drinking he goes into withdrawal.  Patient went to the emergency room earlier today.  Was given a single dose of Ativan and discharged without any kind of taper.  Patient is actively trying to stop drinking.  He states that once the Ativan wore off he became tremulous, agitated and confused.  Patient states that every time he tries to come off of alcohol when he has been drinking this heavily he has had a seizure.  He is actively trying to stop drinking.  Patient had to ingest alcohol when leaving earlier from the ER to the stave off withdrawal symptoms.   Alcohol Problem       Home Medications Prior to Admission medications   Medication Sig Start Date End Date Taking? Authorizing Provider  chlordiazePOXIDE (LIBRIUM) 25 MG capsule Take 1 capsule (25 mg total) by mouth 3 (three) times daily as needed for up to 3 days for anxiety. Patient not taking: Reported on 07/01/2022 06/15/22 06/18/22  Barrie Dunker B, PA-C  fluticasone [facility]) 50 MCG/ACT nasal spray Place 1 spray into both nostrils 2 (two) times daily as needed for allergies.    [provider]  ibuprofen (ADVIL) 200 MG tablet Take 400 mg by mouth daily.    [provider]  levocetirizine (XYZAL) 5 MG tablet Take 5 mg by mouth every evening.    [provider]  mirtazapine (REMERON)  30 MG tablet Take 1 tablet (30 mg total) by mouth at bedtime. 05/05/22   Shanna Cisco, NP  ondansetron (ZOFRAN-ODT) 4 MG disintegrating tablet Take 1 tablet (4 mg total) by mouth every 8 (eight) hours as needed for nausea or vomiting. 06/13/22   Petrucelli, Pleas Koch, PA-C  venlafaxine XR (EFFEXOR-XR) 75 MG 24 hr capsule Take 1 capsule (75 mg total) by mouth daily with breakfast. 05/05/22   Shanna Cisco, NP      Allergies    Benadryl [diphenhydramine] and Codeine    Review of Systems   Review of Systems  Physical Exam Updated Vital Signs BP (!) 153/113   Pulse 95   Temp 99.2 F (37.3 C) (Oral)   Resp 19   SpO2 100%  Physical Exam Vitals and nursing note reviewed.  Constitutional:      General: He is not in acute distress.    Appearance: He is well-developed. He is not diaphoretic.  HENT:     Head: Normocephalic and atraumatic.  Eyes:     General: No scleral icterus.    Conjunctiva/sclera: Conjunctivae normal.  Cardiovascular:     Rate and Rhythm: Normal rate and regular rhythm.     Heart sounds: Normal heart sounds.  Pulmonary:     Effort: Pulmonary effort is normal. No respiratory distress.     Breath sounds: Normal breath sounds.  Abdominal:  Palpations: Abdomen is soft.     Tenderness: There is no abdominal tenderness.  Musculoskeletal:     Cervical back: Normal range of motion and neck supple.  Skin:    General: Skin is warm and dry.  Neurological:     Mental Status: He is alert.     Comments: Speech is slurred demeanor is slowed and compatible with alcohol intoxication.  Patient appears agitated, anxious and tremulous  Psychiatric:        Behavior: Behavior normal.     ED Results / Procedures / Treatments   Labs (all labs ordered are listed, but only abnormal results are displayed) Labs Reviewed  ETHANOL - Abnormal; Notable for the following components:      Result Value   Alcohol, Ethyl (B) 228 (*)    All other components within normal  limits    EKG None  Radiology No results found.  Procedures Procedures    Medications Ordered in ED Medications  LORazepam (ATIVAN) injection 0-4 mg ( Intravenous See Alternative 07/01/22 2007)    Or  LORazepam (ATIVAN) tablet 0-4 mg (1 mg Oral Given 07/01/22 2007)  LORazepam (ATIVAN) injection 0-4 mg (has no administration in time range)    Or  LORazepam (ATIVAN) tablet 0-4 mg (has no administration in time range)  thiamine (VITAMIN B1) tablet 100 mg (100 mg Oral Given 07/01/22 2007)    Or  thiamine (VITAMIN B1) injection 100 mg ( Intravenous See Alternative 07/01/22 2007)    ED Course/ Medical Decision Making/ A&P                           Medical Decision Making This patient well-known to the ER system for his severe alcohol dependence frequent alcohol intoxication visits.  Patient trying to get off of alcohol.  Patient states that whenever he drinks this heavily he has seizures coming off of alcohol.  Seen earlier today, given 1 dose of Ativan at discharge.  Patient states that when the Ativan wore off he began having severe symptoms of withdrawal and began drinking again to stave off withdrawal treatments but would really like to stop.  Patient has been to rehab in the past and is trying to get back in.  He does not feel safe detoxing at home.  Patient presented today with an alcohol of 228 and a CIWA score of 8 at that level.  Patient given Ativan with improvement in his symptoms but remains hypertensive and agitated.  Case discussed with Dr. Arlean Hopping is a feel unsafe discharging this patient on Librium taper.  He is in agreement and we will medically admit the patient for safe detox from alcohol.  Patient is not SI or HI.  Labs were drawn earlier this morning and reviewed.  Repeat ethanol level is 228.  Amount and/or Complexity of Data Reviewed Labs: ordered.  Risk OTC drugs. Prescription drug management. Decision regarding hospitalization.           Final Clinical  Impression(s) / ED Diagnoses Final diagnoses:  None    Rx / DC Orders ED Discharge Orders     None         Arthor Captain, PA-C 07/02/22 Dwain Sarna, MD 07/05/22 2216

## 2022-07-01 NOTE — ED Triage Notes (Signed)
Pt reports feeling n/v x 3 days. Pt also endorses feeling increasingly shaky and anxious. Pt states, " I felt like this last time I went through withdrawals".  Pt's last drink was 19:00 yesterday. Pt currently in out patient rehab via [facility].

## 2022-07-01 NOTE — ED Notes (Signed)
Light green and lavender tube has been collected and sent down to lab.

## 2022-07-01 NOTE — ED Provider Triage Note (Signed)
Emergency Medicine Provider Triage Evaluation Note  [name] , a 31 y.o. male  was evaluated in triage.  Pt complains of alcohol withdrawal.  Patient was seen earlier today at [facility].  He has severe alcohol dependence.  Patient has been drinking fairly steadily for the last 6 months.  He admits to drinking at least 6 or 7 high alcohol concentration (14% alcohol by volume) drinks daily and is drinking other alcoholic beverages in between.  Patient states that when he does not drink he gets the shakes.  He states that when he has been drinking like this he has had a seizure pretty much every time he is trying to come off alcohol.  He was seen earlier today given a single dose of Ativan and told he was stable.  He did not get a Librium detox.  Patient is trying to go back to rehab and feels he may need a medical admission for alcohol dependence..  Patient states that when the Ativan 1 wore off this morning he began getting extremely agitated felt like he could not think clearly was shaky and had a racing heart so drank more alcohol and then returned with his mother.  Review of Systems  Positive: Anxiety and tremulousness Negative: Fever  Physical Exam  BP (!) 158/118 (BP Location: Left Arm)   Pulse (!) 106   Temp 99.2 F (37.3 C) (Oral)   Resp 19   SpO2 100%  Gen:   Awake, no distress   Resp:  Normal effort  MSK:   Moves extremities without difficulty  Other:  Patient is slurring speech, shaky  Medical Decision Making  Medically screening exam initiated at 7:46 PM.  Appropriate orders placed.  KINSTON MAGNAN was informed that the remainder of the evaluation will be completed by another provider, this initial triage assessment does not replace that evaluation, and the importance of remaining in the ED until their evaluation is complete.   Patient case concerning for need for medical admission for alcohol dependence.  We will calculate a CIWA score and get a repeat ethanol level as labs  were done earlier today.   Arthor Captain, PA-C 07/01/22 1949

## 2022-07-01 NOTE — ED Triage Notes (Addendum)
Patient was seen at [facility] earlier, stated he did not get the care he thought he deserved. He wants to get inpatient treatment for alcohol detox. Patient drank 4 boot leggers when he left the hospital today. He said he drinks 8-12 beers a day for about 10 years. He said he is anxious, hands and feet are sweaty. He said he drinks enough to make the tremors go away. He said when he left [facility] earlier, he felt like he could not function or focus on anything. Had a headache.

## 2022-07-01 NOTE — ED Notes (Signed)
Pt reports feeling better. Pt states "I want to continue to follow up outpatient. I don't want inpatient tx"

## 2022-07-01 NOTE — ED Provider Notes (Addendum)
[facility] EMERGENCY DEPARTMENT Provider Note   CSN: [id number] Arrival date & time: 07/01/22  2637     History  Chief Complaint  Patient presents with   Delirium Tremens (DTS)    [name] is a 31 y.o. male with history of alcohol dependence, alcohol withdrawal, major depressive disorder, and generalized anxiety disorder that presents with 1 day of nausea and vomiting that developed after his last drink of alcohol yesterday at 1900.  Patient is also complaining of tremors, anxiousness, intermittent diaphoresis, and frontal headache that began at the same time.  He also states that certain sounds feel louder to him normal for the past day.  The patient states that the symptoms are similar to previous episodes of alcohol withdrawal.  He states that he is currently enrolled in Alcoholics Anonymous and has an Management consultant here at [employer].   Patient denies fever, abdominal pain, numbness, tingling, burning, sensation of bugs crawling on him, visual disturbance.   The history is provided by the patient.       Home Medications Prior to Admission medications   Medication Sig Start Date End Date Taking? Authorizing Provider  chlordiazePOXIDE (LIBRIUM) 25 MG capsule Take 1 capsule (25 mg total) by mouth 3 (three) times daily as needed for up to 3 days for anxiety. 06/15/22 06/18/22  Darrick Grinder, PA-C  fluticasone (FLONASE) 50 MCG/ACT nasal spray Place 1 spray into both nostrils 2 (two) times daily as needed for allergies.    [provider]  ibuprofen (ADVIL) 200 MG tablet Take 400 mg by mouth daily.    [provider]  levocetirizine (XYZAL) 5 MG tablet Take 5 mg by mouth every evening.    [provider]  mirtazapine (REMERON) 30 MG tablet Take 1 tablet (30 mg total) by mouth at bedtime. 05/05/22   Shanna Cisco, NP  ondansetron (ZOFRAN-ODT) 4 MG disintegrating tablet Take 1 tablet (4 mg total) by mouth every 8 (eight) hours  as needed for nausea or vomiting. 06/13/22   Petrucelli, Pleas Koch, PA-C  venlafaxine XR (EFFEXOR-XR) 75 MG 24 hr capsule Take 1 capsule (75 mg total) by mouth daily with breakfast. 05/05/22   Shanna Cisco, NP      Allergies    Benadryl [diphenhydramine] and Codeine    Review of Systems   Review of Systems  Constitutional:  Positive for diaphoresis. Negative for fever.  Eyes:  Negative for visual disturbance.  Gastrointestinal:  Positive for nausea and vomiting. Negative for abdominal distention.  Neurological:  Positive for tremors and headaches. Negative for numbness.  Psychiatric/Behavioral:  The patient is nervous/anxious.        Endorses auditory disturbances.     Physical Exam Updated Vital Signs BP (!) 147/113 (BP Location: Right Arm)   Pulse 89   Temp 98.3 F (36.8 C) (Oral)   Resp 18   SpO2 100%  Physical Exam Constitutional:      Appearance: He is not ill-appearing.  HENT:     Mouth/Throat:     Mouth: Mucous membranes are moist.  Cardiovascular:     Rate and Rhythm: Normal rate and regular rhythm.     Heart sounds: No murmur heard.    No friction rub. No gallop.  Pulmonary:     Effort: Pulmonary effort is normal.     Breath sounds: No wheezing, rhonchi or rales.  Abdominal:     General: Abdomen is flat. There is no distension.     Tenderness: There  is no abdominal tenderness. There is no guarding.  Musculoskeletal:     Right lower leg: No edema.     Left lower leg: No edema.  Skin:    General: Skin is warm and dry.  Neurological:     Mental Status: He is alert.  Psychiatric:     Comments: Appears anxious. Mild tremors present.     ED Results / Procedures / Treatments   Labs (all labs ordered are listed, but only abnormal results are displayed) Labs Reviewed  BASIC METABOLIC PANEL - Abnormal; Notable for the following components:      Result Value   Calcium 8.4 (*)    All other components within normal limits  ETHANOL - Abnormal; Notable for  the following components:   Alcohol, Ethyl (B) 77 (*)    All other components within normal limits  CBC  RAPID URINE DRUG SCREEN, HOSP PERFORMED    EKG None  Radiology No results found.  Procedures Procedures    Medications Ordered in ED Medications - No data to display  ED Course/ Medical Decision Making/ A&P                           Medical Decision Making [name] is a 31 y.o. male with history of alcohol dependence, alcohol withdrawal, major depressive disorder, and generalized anxiety disorder that presents with 1 day of nausea and vomiting, tremors, anxiousness, intermittent diaphoresis, and frontal headache.  After reviewing the patient's chart, the patient has been seen in the emergency department for alcohol withdrawal several times in the last year.  Differential diagnosis includes but is not limited to alcohol withdrawal, delirium tremens. My suspicion for delirium tremens is low given the timing of the patients last drink as well as his clinical presentation. Will order CBC, BMP, and hepatic function panel to assess cell counts, electrolytes, renal function, liver function. Will order ethanol level and rapid drug screen. Will order lorazepam and reassess the patient.  Patient discussed with Dr. Stevie Kern.    Amount and/or Complexity of Data Reviewed Labs: ordered.  Risk Prescription drug management.  9:15 AM The patient's BMP is unremarkable.  Patient CBC is unremarkable.  Patient's ethanol level is elevated at 77.  Patient's urine toxicology was positive for amphetamines and benzodiazepines.   10:30 AM Patient states that he is feeling better after receiving lorazepam.  11:35 AM The patients hepatic function panel is unremarkable with the exception of mildly elevated AST of 69. I updated the patient on the results of his labs. Patient is beginning to feel anxious again. Discussed plan to order another dose of lorazepam and reassess.  1:05 PM Patient is  feeling better after receiving his second dose of lorazepam. Patient is comfortable going home at this point. Discussed with the patient that he should not drive home after receiving lorazepam and should get a ride home from another individual. Discussed with patient plan to follow-up with his primary care doctor for his symptoms. Discussed with patient plan to follow up with his alcoholics anonymous group as well as his addiction counselor.  Discussed with patient plan to return to the emergency department if he develops new or worsening symptoms including but not limited to vomiting, tremors, headache, seizures.  Patient agrees with the plan.        Final Clinical Impression(s) / ED Diagnoses Final diagnoses:  None    Rx / DC Orders ED Discharge Orders     None  Karoline Caldwell, MD 07/01/22 1155    Karoline Caldwell, MD 07/01/22 1307    Milagros Loll, MD 07/02/22 [phone]

## 2022-07-01 NOTE — Discharge Instructions (Addendum)
We discussed your diagnosis of alcohol withdrawal.  Please follow-up with your primary care doctor for your symptoms.  Please continue to follow up with your alcoholics anonymous group as well as your addiction counselor. Please return to the emergency department if you develop new or worsening symptoms including but not limited to vomiting, headache, tremors.

## 2022-07-02 ENCOUNTER — Encounter (HOSPITAL_COMMUNITY): Payer: Self-pay | Admitting: Internal Medicine

## 2022-07-02 DIAGNOSIS — Z8249 Family history of ischemic heart disease and other diseases of the circulatory system: Secondary | ICD-10-CM | POA: Diagnosis not present

## 2022-07-02 DIAGNOSIS — Z888 Allergy status to other drugs, medicaments and biological substances status: Secondary | ICD-10-CM | POA: Diagnosis not present

## 2022-07-02 DIAGNOSIS — F1020 Alcohol dependence, uncomplicated: Secondary | ICD-10-CM | POA: Diagnosis not present

## 2022-07-02 DIAGNOSIS — R030 Elevated blood-pressure reading, without diagnosis of hypertension: Secondary | ICD-10-CM | POA: Diagnosis present

## 2022-07-02 DIAGNOSIS — F10229 Alcohol dependence with intoxication, unspecified: Secondary | ICD-10-CM | POA: Diagnosis present

## 2022-07-02 DIAGNOSIS — F10230 Alcohol dependence with withdrawal, uncomplicated: Secondary | ICD-10-CM

## 2022-07-02 DIAGNOSIS — F10231 Alcohol dependence with withdrawal delirium: Secondary | ICD-10-CM | POA: Diagnosis present

## 2022-07-02 DIAGNOSIS — Z79899 Other long term (current) drug therapy: Secondary | ICD-10-CM | POA: Diagnosis not present

## 2022-07-02 DIAGNOSIS — F329 Major depressive disorder, single episode, unspecified: Secondary | ICD-10-CM | POA: Diagnosis present

## 2022-07-02 DIAGNOSIS — J309 Allergic rhinitis, unspecified: Secondary | ICD-10-CM | POA: Diagnosis present

## 2022-07-02 DIAGNOSIS — F411 Generalized anxiety disorder: Secondary | ICD-10-CM | POA: Diagnosis present

## 2022-07-02 DIAGNOSIS — F1010 Alcohol abuse, uncomplicated: Secondary | ICD-10-CM | POA: Diagnosis present

## 2022-07-02 DIAGNOSIS — Y903 Blood alcohol level of 60-79 mg/100 ml: Secondary | ICD-10-CM | POA: Diagnosis present

## 2022-07-02 DIAGNOSIS — F10930 Alcohol use, unspecified with withdrawal, uncomplicated: Secondary | ICD-10-CM | POA: Diagnosis not present

## 2022-07-02 DIAGNOSIS — F10239 Alcohol dependence with withdrawal, unspecified: Secondary | ICD-10-CM | POA: Diagnosis present

## 2022-07-02 DIAGNOSIS — Z8616 Personal history of COVID-19: Secondary | ICD-10-CM | POA: Diagnosis not present

## 2022-07-02 DIAGNOSIS — Z885 Allergy status to narcotic agent status: Secondary | ICD-10-CM | POA: Diagnosis not present

## 2022-07-02 LAB — COMPREHENSIVE METABOLIC PANEL
ALT: 39 U/L (ref 0–44)
AST: 69 U/L — ABNORMAL HIGH (ref 15–41)
Albumin: 3.3 g/dL — ABNORMAL LOW (ref 3.5–5.0)
Alkaline Phosphatase: 54 U/L (ref 38–126)
Anion gap: 7 (ref 5–15)
BUN: 14 mg/dL (ref 6–20)
CO2: 25 mmol/L (ref 22–32)
Calcium: 8.1 mg/dL — ABNORMAL LOW (ref 8.9–10.3)
Chloride: 108 mmol/L (ref 98–111)
Creatinine, Ser: 1.15 mg/dL (ref 0.61–1.24)
GFR, Estimated: >60 mL/min (ref >60)
Glucose, Bld: 78 mg/dL (ref 70–99)
Potassium: 3.8 mmol/L (ref 3.5–5.1)
Sodium: 140 mmol/L (ref 135–145)
Total Bilirubin: 1.1 mg/dL (ref 0.3–1.2)
Total Protein: 6.3 g/dL — ABNORMAL LOW (ref 6.5–8.1)

## 2022-07-02 LAB — CBC WITH DIFFERENTIAL/PLATELET
Abs Immature Granulocytes: 0.01 10*3/uL (ref 0.00–0.07)
Basophils Absolute: 0 10*3/uL (ref 0.0–0.1)
Basophils Relative: 1 %
Eosinophils Absolute: 0.1 10*3/uL (ref 0.0–0.5)
Eosinophils Relative: 2 %
HCT: 41.1 % (ref 39.0–52.0)
Hemoglobin: 14 g/dL (ref 13.0–17.0)
Immature Granulocytes: 0 %
Lymphocytes Relative: 55 %
Lymphs Abs: 2.7 10*3/uL (ref 0.7–4.0)
MCH: 30.2 pg (ref 26.0–34.0)
MCHC: 34.1 g/dL (ref 30.0–36.0)
MCV: 88.8 fL (ref 80.0–100.0)
Monocytes Absolute: 0.5 10*3/uL (ref 0.1–1.0)
Monocytes Relative: 10 %
Neutro Abs: 1.5 10*3/uL — ABNORMAL LOW (ref 1.7–7.7)
Neutrophils Relative %: 32 %
Platelets: 209 10*3/uL (ref 150–400)
RBC: 4.63 MIL/uL (ref 4.22–5.81)
RDW: 12.5 % (ref 11.5–15.5)
WBC: 4.8 10*3/uL (ref 4.0–10.5)
nRBC: 0 % (ref 0.0–0.2)

## 2022-07-02 LAB — PHOSPHORUS: Phosphorus: 2.9 mg/dL (ref 2.5–4.6)

## 2022-07-02 LAB — MAGNESIUM
Magnesium: 2.3 mg/dL (ref 1.7–2.4)
Magnesium: 2.5 mg/dL — ABNORMAL HIGH (ref 1.7–2.4)

## 2022-07-02 MED ORDER — PNEUMOCOCCAL 20-VAL CONJ VACC 0.5 ML IM SUSY
0.5000 mL | PREFILLED_SYRINGE | INTRAMUSCULAR | Status: DC
  Filled 2022-07-02: qty 0.5

## 2022-07-02 MED ORDER — LEVOCETIRIZINE DIHYDROCHLORIDE 5 MG PO TABS: 5.0000 mg | ORAL_TABLET | Freq: Every evening | ORAL | Status: DC

## 2022-07-02 MED ORDER — MIRTAZAPINE 15 MG PO TABS
30.0000 mg | ORAL_TABLET | Freq: Every day | ORAL | Status: DC
  Administered 2022-07-02 – 2022-07-03 (×2): 30 mg via ORAL
  Filled 2022-07-02 (×2): qty 2

## 2022-07-02 MED ORDER — FLUTICASONE PROPIONATE 50 MCG/ACT NA SUSP: 1.0000 | Freq: Two times a day (BID) | NASAL | Status: DC | PRN

## 2022-07-02 MED ORDER — VENLAFAXINE HCL ER 75 MG PO CP24
75.0000 mg | ORAL_CAPSULE | Freq: Every day | ORAL | Status: DC
  Administered 2022-07-03 – 2022-07-04 (×2): 75 mg via ORAL
  Filled 2022-07-02 (×2): qty 1

## 2022-07-02 MED ORDER — LORATADINE 10 MG PO TABS
10.0000 mg | ORAL_TABLET | Freq: Every day | ORAL | Status: DC
  Administered 2022-07-02 – 2022-07-04 (×3): 10 mg via ORAL
  Filled 2022-07-02 (×3): qty 1

## 2022-07-02 NOTE — Progress Notes (Signed)
The patient arrived to the unit. He is alert, oriented x4, ambulatory without assistance. Skin is intact. Tele applied.

## 2022-07-02 NOTE — Discharge Instructions (Signed)
Outpatient Substance Use Treatment Services   [facility]  Chemical Dependence Intensive Outpatient Program [address] [place], [place] [address]  [phone] Private insurance, Medicare A&B, and [facility]   ADS (Alcohol and Drug Services)  [address],  [place], [place] [address] [phone] Medicaid, Self Pay   [facility]      [address] Leonard Schwartz  [place], [place] [phone] Medicaid and [facility], Self Pay   [facility] [address]  [place], [place]  [phone] [facility], and Self Pay  [facility] [place]      [address]    [place], [place] [address]  [phone] or [phone] Private Insurance Only                 [facility] [address]  [place], [place] [address] [phone] Medicaid, Medicare, Private Insurance  [facility] Counseling Services at the [facility] [address]  [place], [place] [address] [phone] Services are free or reduced  [facility]  [address] [phone]  Self Pay only, sliding scale  [facility]  [address]  [place], [place] [address] [phone] ([facility]) Self Pay, Medicaid Only   [facility] [address], [place] [address] [phone] Medicaid, Medicare, Private Insurance                     Adolescent Substance Use Treatment Services    [facility] [address]  [place], [place]  [phone] Self Pay Offer scholarships from the [facility] to help pay for treatment  Website: http://www.[place].com/  [facility] Adolescent Substance use Program Males ages: 36-17 Adolescent Substance use Program Females: 12-17  [facility] [address]  [place], [place] [address] (ph) [phone]  (fax) [phone]  [facility]  [address]  [place], [place] [address] (ph) [phone]  (fax) [phone]  [facility] [address]  [place], [place] [address] (ph) [phone]  (fax) [phone]  [facility] [address], [place], [place] [address] (ph) [phone]   (fax) [phone]  Website: [email or web address].[email or web address]/         [facility] Substance Abuse Intensive Outpatient Program for Adolescents Phone: [phone] Address: [address], [place], [place] Website: https://wilkins.info/    Residential Substance Use Engineer, materials ([facility]  [address]  [place], [place] [address]  [phone] or [phone] Detox (Medicare, Medicaid, private insurance, and self pay)  Residential Rehab 14 days (Medicare, Medicaid, private insurance, and self pay)   [facility] (Residential Treatment Services)  [address] [facility] [place], [place]  [phone]  Male and Male Detox (Self Pay and Medicaid limited availability)  Rehab only Male (Medicaid and self pay only)   [facility] [address]      [address]  [place], [place] [address]  [phone] or [phone] Detox and Residential Treatment Private Insurance Only   [facility] Residential Treatment Facility  [address]  [place], [place] [address]  [phone]  Treatment Only, must make assessment appointment, and must be sober for assessment appointment.  Self Pay Only, Medicare A&B, [facility] [place] ID only! Youth worker offered from [employer]     [address] [place], [place] [address] Walk in interviews M-Sat 8-4p No  pending legal charges [phone]     ADATC:  [facility] Referral  [address] [place], [place] [phone] (Self Pay, [facility])  [facility] [address] [place], [place] [address] [phone] Detox and Residential Treatment Medicare and Private Insurance  [facility] [address]  [place], [place] [address] 28 Day Women's Facility: [phone] 28 Day Men's Facility: [phone] Long-term Residential Program:  [phone] Males 25 and Over (No Insurance, upfront fee)  [facility]  [address] [place], [place] [address] [phone] Private Insurance with [place], Private Pay  [facility] [address] [place], [place] [address] Local [phone] Private Insurance Only  [facility] [address]  [place], [place] [address]  [phone] (Males, upfront fee)  [facility] [address]  [place], [address] [phone] Private Insurance      [facility] Locations  [facility]  [address]  [place], [place]  [phone] [facility] Based Program for individuals experiencing homelessness Self Pay, No insurance  [facility]  Men's program: [facility] [address] [place], [place] [address] [phone]  [facility] Women's program: [facility] [address] [place], [place] [address] [phone] Christian Based Program for individuals experiencing homelessness Self Pay, No insurance  [facility] Men's Division [address] [place], [place] [address]  [phone] [facility] Based Program for individuals experiencing homelessness Self Pay, No insurance  [facility] Women's Division [address] [place], [place] [address] [phone] [facility] Based Program for individuals experiencing homelessness Self Pay, No insurance  [facility] [address] [place], [place] [phone] [facility] Based Program for males experiencing homelessness Self Pay, No insurance   Additional Discharge Instructions   Please get your medications reviewed and adjusted by your Primary MD.  Please  request your Primary MD to go over all Hospital Tests and Procedure/Radiological results at the follow up, please get all Hospital records sent to your Prim MD by signing hospital release before you go home.  If you had Pneumonia of Lung problems at the Hospital: Please get a 2 view Chest X ray done in approximately 4 weeks after hospital discharge or sooner if instructed by your Primary MD.  If you have Congestive Heart Failure: Please call your Cardiologist or Primary MD anytime you have any of the following symptoms:  1) 3 pound weight gain in 24 hours or 5 pounds in 1 week  2) shortness of breath, with or without a dry hacking cough  3) swelling in the hands, feet or stomach  4) if you have to sleep on extra pillows at night in order to breathe  Follow cardiac low salt diet and 1.5 lit/day fluid restriction.  If you have diabetes Accuchecks 4 times/day, Once in AM empty stomach and then before each meal. Log in all results and show them to your primary doctor at your next visit. If any glucose reading is under 80 or above 300 call your primary MD immediately.  If you have Seizure/Convulsions/Epilepsy: Please do not drive, operate heavy machinery, participate in activities at heights or participate in high speed sports until you have seen by Primary MD or a Neurologist and advised to do so again. Per [facility] statutes, patients with seizures are not allowed to drive until they have been seizure-free for six months.  Use caution when using heavy equipment or power tools. Avoid working on ladders or at heights. Take showers instead of  baths. Ensure the water temperature is not too high on the home water heater. Do not go swimming alone. Do not lock yourself in a room alone (i.e. bathroom). When caring for infants or small children, sit down when holding, feeding, or changing them to minimize risk of injury to the child in the event you have a seizure. Maintain good sleep hygiene.  Avoid alcohol.   If you had Gastrointestinal Bleeding: Please ask your Primary MD to check a complete blood count within one week of discharge or at your next visit. Your endoscopic/colonoscopic biopsies that are pending at the time of discharge, will also need to followed by your Primary MD.  Get Medicines reviewed and adjusted. Please take all your medications with you for your next visit with your Primary MD  Please request your Primary MD to go over all hospital tests and procedure/radiological results at the follow up, please ask your Primary MD to get all Hospital records sent to his/her office.  If you experience worsening of your admission symptoms, develop shortness of breath, life threatening emergency, suicidal or homicidal thoughts you must seek medical attention immediately by calling 911 or calling your MD immediately  if symptoms less severe.  You must read complete instructions/literature along with all the possible adverse reactions/side effects for all the Medicines you take and that have been prescribed to you. Take any new Medicines after you have completely understood and accpet all the possible adverse reactions/side effects.   Do not drive or operate heavy machinery when taking Pain medications.   Do not take more than prescribed Pain, Sleep and Anxiety Medications  Special Instructions: If you have smoked or chewed Tobacco  in the last 2 yrs please stop smoking, stop any regular Alcohol  and or any Recreational drug use.  Wear Seat belts while driving.  Please note You were cared for by a hospitalist during your hospital stay. If you have any questions about your discharge medications or the care you received while you were in the hospital after you are discharged, you can call the unit and asked to speak with the hospitalist on call if the hospitalist that took care of you is not available. Once you are discharged, your primary care physician will handle any further  medical issues. Please note that NO REFILLS for any discharge medications will be authorized once you are discharged, as it is imperative that you return to your primary care physician (or establish a relationship with a primary care physician if you do not have one) for your aftercare needs so that they can reassess your need for medications and monitor your lab values.  You can reach the hospitalist office at phone [phone] or fax [phone]   If you do not have a primary care physician, you can call [phone] for a physician referral.

## 2022-07-02 NOTE — TOC Initial Note (Signed)
Transition of Care [facility]) - Initial/Assessment Note    Patient Details  Name: [name] MRN: [id number] Date of Birth: Jul 04, 1991  [facility] of [facility]) CM/SW Contact:    Lanier Clam, RN Phone Number: 07/02/2022, 2:35 PM  Clinical Narrative: Referral for Substance Abuse resources-patient agreed to resources-added to d/c instructions. No further CM needs.                  Expected Discharge Plan: Home/Self Care Barriers to Discharge: Continued Medical Work up   Patient Goals and CMS Choice Patient states their goals for this hospitalization and ongoing recovery are:: Home CMS Medicare.gov Compare Post Acute Care list provided to:: Patient Choice offered to / list presented to : Patient  Expected Discharge Plan and Services Expected Discharge Plan: Home/Self Care   Discharge Planning Services: CM Consult   Living arrangements for the past 2 months: Single Family Home                                      Prior Living Arrangements/Services Living arrangements for the past 2 months: Single Family Home                     Activities of Daily Living      Permission Sought/Granted                  Emotional Assessment              Admission diagnosis:  Alcohol withdrawal (HCC) [F10.939] Patient Active Problem List   Diagnosis Date Noted   Allergic rhinitis 07/02/2022   Chronic alcohol abuse 07/02/2022   Alcohol withdrawal (HCC) 07/01/2022   Alcohol dependence with alcohol-induced mood disorder (HCC)    Generalized anxiety disorder 09/07/2021   Mild depression 09/07/2021   MDD (major depressive disorder), recurrent episode, severe (HCC) 04/18/2020   Alcohol withdrawal, with perceptual disturbance (HCC) 09/15/2018   PCP:  Pcp, No Pharmacy:   [id number] [place], [place] - [address] AT [facility] [address] [place] [address] Phone: [phone] Fax:  [phone]  [id number] [place], [place] - [address] AT [facility] OF [address] & [address] [place] [address] Phone: [phone] Fax: [phone]     Social Determinants of Health (SDOH) Interventions    Readmission Risk Interventions     No data to display

## 2022-07-02 NOTE — Progress Notes (Signed)
PROGRESS NOTE   [name]  GNF:[id number]    DOB: [birth year]    DOA: 07/01/2022  PCP: Pcp, No   I have briefly reviewed patients previous [facility] in [facility].  Chief Complaint  Patient presents with   Alcohol Problem    Brief Narrative:  31 year old male, PMH significant for alcohol dependence, alcohol withdrawal, major depressive disorder, generalized anxiety disorder, presented for the second time in 24 hours to the ED with complaints of nausea, vomiting, tremors, anxiousness, intermittent diaphoresis and frontal headache, symptoms similar to prior episodes of alcohol withdrawal.  Reportedly has been seen in ED for alcohol withdrawal several times in the last year.  Seen in [facility] ED on 7/27 morning, treated with a dose of Ativan and discharged home (notation indicates that he wanted to continue follow-up outpatient and did not want inpatient treatment).  He returned later same evening to [facility] ED wanting inpatient treatment for alcohol detox, reportedly drank for both legs after leaving [facility] ED to avoid withdrawal symptoms.  Admitted for alcohol intoxication, and early withdrawal.   Assessment & Plan:  Principal Problem:   Alcohol withdrawal (HCC) Active Problems:   Allergic rhinitis   Chronic alcohol abuse   Alcohol dependence with intoxication and early withdrawal: History of chronic alcohol dependence.  Exact amount of alcohol consumption unclear but as per H&P, consumes at least 6-7 8.5 ounce malt beverages per day with each malt beverage containing approximately 16% alcohol by volume.  Reportedly has been consuming alcohol on a daily basis like this for well over a year.  BAL in [facility] ED on 7/27: 77 followed [facility] ED [facility] 228.  Admitted to progressive unit.  Treating per CIWA protocol.  Has had CIWA scores of 7-8 thus far.  At risk for DTs and alcohol withdrawal seizures.  Reports that he has a sponsor for alcohol Anonymous but has not followed there lately and reports  also having an addiction counselor through [employer] health.  TOC consulted.  History of alcohol withdrawal seizures:  Reports last seizure approximately 1 year ago.  Seizure precautions and monitor for seizures.  Generalized anxiety disorder and MDD:  Continue prior home dose of venlafaxine XR and Remeron.  Allergic rhinitis: Continue prior home regimen.     DVT prophylaxis: SCDs Start: 07/01/22 2211     Code Status: Full Code:  Family Communication: None at bedside. Disposition:  Status is: Observation The patient will require care spanning > 2 midnights and should be moved to inpatient because: Ongoing alcohol withdrawal, at risk for worsening including DTs and alcohol withdrawal seizures.  CIWA protocol.     Consultants:   None  Procedures:     Antimicrobials:      Subjective:  Seen this morning while in ED.  Overall feels somewhat better with decreased anxiety, tremulousness, no further nausea or vomiting.  Denies any other substance abuse other than alcohol.  Does volunteer to vaping.  Indicates that he has been in inpatient alcohol rehab in the past, may be 5 to 6 years ago.  No pain or other complaints reported.  As per ED RN, no acute issues noted.  Objective:   Vitals:   07/02/22 0600 07/02/22 0830 07/02/22 0920 07/02/22 1001  BP: 125/80 (!) 146/111 (!) 138/96   Pulse: 67 64 98   Resp: (!) 23 13    Temp:    98.4 F (36.9 C)  TempSrc: Oral   Oral  SpO2: 99% 99%  General exam: Young male, moderately built and nourished lying comfortably propped up in bed without distress.  Oral mucosa with borderline hydration. Respiratory system: Clear to auscultation. Respiratory effort normal. Cardiovascular system: S1 & S2 heard, RRR. No JVD, murmurs, rubs, gallops or clicks. No pedal edema.  Telemetry personally reviewed: Sinus rhythm. Gastrointestinal system: Abdomen is nondistended, soft and nontender. No organomegaly or masses felt. Normal bowel sounds  heard. Central nervous system: Alert and oriented. No focal neurological deficits. Extremities: Symmetric 5 x 5 power. Skin: No rashes, lesions or ulcers.  Diffuse body tattoos. Psychiatry: Judgement and insight appear normal. Mood & affect appropriate without overly anxious and no tremulousness noted.     Data Reviewed:   I have personally reviewed following labs and imaging studies   CBC: Recent Labs  Lab 07/01/22 0741 07/02/22 0330  WBC 4.0 4.8  NEUTROABS  --  1.5*  HGB 15.0 14.0  HCT 44.3 41.1  MCV 89.1 88.8  PLT 232 209    Basic Metabolic Panel: Recent Labs  Lab 07/01/22 0741 07/01/22 2008 07/02/22 0330  NA 139  --  140  K 4.3  --  3.8  CL 105  --  108  CO2 24  --  25  GLUCOSE 99  --  78  BUN 17  --  14  CREATININE 1.10  --  1.15  CALCIUM 8.4*  --  8.1*  MG  --  2.3 2.5*  PHOS  --   --  2.9    Liver Function Tests: Recent Labs  Lab 07/01/22 0831 07/02/22 0330  AST 69* 69*  ALT 37 39  ALKPHOS 56 54  BILITOT 1.1 1.1  PROT 6.8 6.3*  ALBUMIN 3.7 3.3*    CBG: No results for input(s): "GLUCAP" in the last 168 hours.  Microbiology Studies:  No results found for this or any previous visit (from the past 240 hour(s)).  Radiology Studies:  No results found.  Scheduled Meds:    folic acid  1 mg Oral Daily   loratadine  10 mg Oral Daily   LORazepam  0-4 mg Intravenous Q6H   Or   LORazepam  0-4 mg Oral Q6H   [START ON 07/04/2022] LORazepam  0-4 mg Intravenous Q12H   Or   [START ON 07/04/2022] LORazepam  0-4 mg Oral Q12H   multivitamin  1 tablet Oral Daily   thiamine  100 mg Oral Daily   Or   thiamine  100 mg Intravenous Daily    Continuous Infusions:     LOS: 0 days     Marcellus Scott, MD,  FACP, [facility], [facility], [facility] (Care Management Physician Certified) [facility] Ad[facility] [place]  To contact the attending provider between 7A-7P or the covering provider during after hours 7P-7A, please log into the web site  [email or web address] and access using universal [place] password for that web site. If you do not have the password, please call the hospital operator.  07/02/2022, 12:10 PM

## 2022-07-03 DIAGNOSIS — F1020 Alcohol dependence, uncomplicated: Secondary | ICD-10-CM

## 2022-07-03 DIAGNOSIS — F10230 Alcohol dependence with withdrawal, uncomplicated: Secondary | ICD-10-CM | POA: Diagnosis not present

## 2022-07-03 LAB — COMPREHENSIVE METABOLIC PANEL
ALT: 40 U/L (ref 0–44)
AST: 58 U/L — ABNORMAL HIGH (ref 15–41)
Albumin: 3.1 g/dL — ABNORMAL LOW (ref 3.5–5.0)
Alkaline Phosphatase: 55 U/L (ref 38–126)
Anion gap: 7 (ref 5–15)
BUN: 14 mg/dL (ref 6–20)
CO2: 25 mmol/L (ref 22–32)
Calcium: 8.8 mg/dL — ABNORMAL LOW (ref 8.9–10.3)
Chloride: 112 mmol/L — ABNORMAL HIGH (ref 98–111)
Creatinine, Ser: 1.17 mg/dL (ref 0.61–1.24)
GFR, Estimated: >60 mL/min (ref >60)
Glucose, Bld: 98 mg/dL (ref 70–99)
Potassium: 3.6 mmol/L (ref 3.5–5.1)
Sodium: 144 mmol/L (ref 135–145)
Total Bilirubin: 1 mg/dL (ref 0.3–1.2)
Total Protein: 6.3 g/dL — ABNORMAL LOW (ref 6.5–8.1)

## 2022-07-03 LAB — CBC
HCT: 40.2 % (ref 39.0–52.0)
Hemoglobin: 14 g/dL (ref 13.0–17.0)
MCH: 30.4 pg (ref 26.0–34.0)
MCHC: 34.8 g/dL (ref 30.0–36.0)
MCV: 87.2 fL (ref 80.0–100.0)
Platelets: 172 10*3/uL (ref 150–400)
RBC: 4.61 MIL/uL (ref 4.22–5.81)
RDW: 12.4 % (ref 11.5–15.5)
WBC: 4.6 10*3/uL (ref 4.0–10.5)
nRBC: 0 % (ref 0.0–0.2)

## 2022-07-03 MED ORDER — HYDRALAZINE HCL 20 MG/ML IJ SOLN: 10.0000 mg | Freq: Four times a day (QID) | INTRAMUSCULAR | Status: DC | PRN

## 2022-07-03 MED ORDER — LORAZEPAM 1 MG PO TABS
2.0000 mg | ORAL_TABLET | Freq: Once | ORAL | Status: AC
  Administered 2022-07-03: 2 mg via ORAL
  Filled 2022-07-03: qty 2

## 2022-07-03 NOTE — Progress Notes (Signed)
PROGRESS NOTE   [name]  UVO:[id number]    DOB: 27-Aug-1991    DOA: 07/01/2022  PCP: Pcp, No   I have briefly reviewed patients previous medical records in [facility].  Chief Complaint  Patient presents with   Alcohol Problem    Brief Narrative:  31 year old male, PMH significant for alcohol dependence, alcohol withdrawal, major depressive disorder, generalized anxiety disorder, presented for the second time in 24 hours to the ED with complaints of nausea, vomiting, tremors, anxiousness, intermittent diaphoresis and frontal headache, symptoms similar to prior episodes of alcohol withdrawal.  Reportedly has been seen in ED for alcohol withdrawal several times in the last year.  Seen in [facility] ED on 7/27 morning, treated with a dose of Ativan and discharged home (notation indicates that he wanted to continue follow-up outpatient and did not want inpatient treatment).  He returned later same evening to [facility] ED wanting inpatient treatment for alcohol detox, reportedly drank for both legs after leaving [facility] ED to avoid withdrawal symptoms.  Admitted for alcohol intoxication, and early withdrawal.  Feels better but still with high CIWA scores.   Assessment & Plan:  Principal Problem:   Alcohol withdrawal (HCC) Active Problems:   Allergic rhinitis   Chronic alcohol abuse   Alcohol dependence with intoxication and early withdrawal: History of chronic alcohol dependence.  Exact amount of alcohol consumption unclear but as per H&P, consumes at least 6-7 8.5 ounce malt beverages per day with each malt beverage containing approximately 16% alcohol by volume.  Reportedly has been consuming alcohol on a daily basis like this for well over a year.  BAL in [facility] ED on 7/27: 77 followed later in [facility] ED of 228.  Admitted to progressive unit.  Treating per CIWA protocol.  Has had CIWA scores of 7-8 thus far.  At risk for DTs and alcohol withdrawal seizures.  Reports that he has a sponsor for alcohol Anonymous  but has not followed there lately and reports also having an addiction counselor through [employer] [facility].  TOC consulted.  Overall feels better but still with high CIWA scores of 12-13.  If worsens, would have low threshold to transfer to stepdown/ICU.  History of alcohol withdrawal seizures:  Reports last seizure approximately 1 year ago.  Seizure precautions and monitor for seizures.  Generalized anxiety disorder and MDD:  Continue prior home dose of venlafaxine XR and Remeron.  Allergic rhinitis: Continue prior home regimen.  Elevated blood pressures: Likely related to alcohol withdrawal.  As needed IV hydralazine.  Monitor.   DVT prophylaxis: SCDs Start: 07/01/22 2211     Code Status: Full Code:  Family Communication: None at bedside. Disposition:  Status is: Inpatient. Ongoing alcohol withdrawal, managing with CIWA protocol, remains at risk for deterioration.  Needs close monitoring.     Consultants:   None  Procedures:     Antimicrobials:      Subjective:  Interviewed and examined along with his RN at bedside.  Overall feels better.  However as per RN at the same time, had CIWA score of 13 and was getting ready to give him his medications.  No pain reported.  Mild nausea, somewhat tremulous and anxious.  RN also reported intermittent elevated blood pressures.  Objective:   Vitals:   07/03/22 0047 07/03/22 0533 07/03/22 0951 07/03/22 1228  BP: (!) 135/98 (!) 140/96 (!) 143/107 (!) 135/98  Pulse: 78 68 83 68  Resp: 16 18  17   Temp: 98.3 F (36.8 C) 98.5 F (  36.9 C)  98 F (36.7 C)  TempSrc:    Oral  SpO2: 99% 99%  100%  Weight:      Height:        General exam: Young male, moderately built and nourished lying comfortably propped up in bed without distress.  Oral mucosa moist. Respiratory system: Clear to auscultation.  No increased work of breathing. Cardiovascular system: S1 & S2 heard, RRR. No JVD, murmurs, rubs, gallops or clicks. No pedal edema.  Telemetry  personally reviewed: Sinus rhythm.  Occasional sinus tachycardia. Gastrointestinal system: Abdomen is nondistended, soft and nontender. No organomegaly or masses felt. Normal bowel sounds heard. Central nervous system: Alert and oriented. No focal neurological deficits. Extremities: Symmetric 5 x 5 power. Skin: No rashes, lesions or ulcers.  Diffuse body tattoos. Psychiatry: Judgement and insight appear normal. Mood & affect, somewhat anxious.    Data Reviewed:   I have personally reviewed following labs and imaging studies   CBC: Recent Labs  Lab 07/01/22 0741 07/02/22 0330 07/03/22 0528  WBC 4.0 4.8 4.6  NEUTROABS  --  1.5*  --   HGB 15.0 14.0 14.0  HCT 44.3 41.1 40.2  MCV 89.1 88.8 87.2  PLT 232 209 172    Basic Metabolic Panel: Recent Labs  Lab 07/01/22 0741 07/01/22 2008 07/02/22 0330 07/03/22 0528  NA 139  --  140 144  K 4.3  --  3.8 3.6  CL 105  --  108 112*  CO2 24  --  25 25  GLUCOSE 99  --  78 98  BUN 17  --  14 14  CREATININE 1.10  --  1.15 1.17  CALCIUM 8.4*  --  8.1* 8.8*  MG  --  2.3 2.5*  --   PHOS  --   --  2.9  --     Liver Function Tests: Recent Labs  Lab 07/01/22 0831 07/02/22 0330 07/03/22 0528  AST 69* 69* 58*  ALT 37 39 40  ALKPHOS 56 54 55  BILITOT 1.1 1.1 1.0  PROT 6.8 6.3* 6.3*  ALBUMIN 3.7 3.3* 3.1*    CBG: No results for input(s): "GLUCAP" in the last 168 hours.  Microbiology Studies:  No results found for this or any previous visit (from the past 240 hour(s)).  Radiology Studies:  No results found.  Scheduled Meds:    folic acid  1 mg Oral Daily   loratadine  10 mg Oral Daily   [START ON 07/04/2022] LORazepam  0-4 mg Intravenous Q12H   Or   [START ON 07/04/2022] LORazepam  0-4 mg Oral Q12H   mirtazapine  30 mg Oral QHS   multivitamin  1 tablet Oral Daily   pneumococcal 20-valent conjugate vaccine  0.5 mL Intramuscular Tomorrow-1000   thiamine  100 mg Oral Daily   Or   thiamine  100 mg Intravenous Daily    venlafaxine XR  75 mg Oral Q breakfast    Continuous Infusions:     LOS: 1 day     Marcellus Scott, MD,  FACP, [facility], [facility], [facility] (Care Management Physician Certified) [facility] [place]  To contact the attending provider between 7A-7P or the covering provider during after hours 7P-7A, please log into the web site [email or web address] and access using universal [place] password for that web site. If you do not have the password, please call the hospital operator.  07/03/2022, 12:32 PM

## 2022-07-04 DIAGNOSIS — F10930 Alcohol use, unspecified with withdrawal, uncomplicated: Secondary | ICD-10-CM | POA: Diagnosis not present

## 2022-07-04 DIAGNOSIS — F1020 Alcohol dependence, uncomplicated: Secondary | ICD-10-CM | POA: Diagnosis not present

## 2022-07-04 LAB — COMPREHENSIVE METABOLIC PANEL
ALT: 32 U/L (ref 0–44)
AST: 36 U/L (ref 15–41)
Albumin: 3.2 g/dL — ABNORMAL LOW (ref 3.5–5.0)
Alkaline Phosphatase: 48 U/L (ref 38–126)
Anion gap: 10 (ref 5–15)
BUN: 9 mg/dL (ref 6–20)
CO2: 25 mmol/L (ref 22–32)
Calcium: 8.8 mg/dL — ABNORMAL LOW (ref 8.9–10.3)
Chloride: 104 mmol/L (ref 98–111)
Creatinine, Ser: 0.97 mg/dL (ref 0.61–1.24)
GFR, Estimated: >60 mL/min (ref >60)
Glucose, Bld: 103 mg/dL — ABNORMAL HIGH (ref 70–99)
Potassium: 3.6 mmol/L (ref 3.5–5.1)
Sodium: 139 mmol/L (ref 135–145)
Total Bilirubin: 1 mg/dL (ref 0.3–1.2)
Total Protein: 6.3 g/dL — ABNORMAL LOW (ref 6.5–8.1)

## 2022-07-04 LAB — CBC
HCT: 40.3 % (ref 39.0–52.0)
Hemoglobin: 14 g/dL (ref 13.0–17.0)
MCH: 31 pg (ref 26.0–34.0)
MCHC: 34.7 g/dL (ref 30.0–36.0)
MCV: 89.2 fL (ref 80.0–100.0)
Platelets: 154 10*3/uL (ref 150–400)
RBC: 4.52 MIL/uL (ref 4.22–5.81)
RDW: 12.7 % (ref 11.5–15.5)
WBC: 5 10*3/uL (ref 4.0–10.5)
nRBC: 0 % (ref 0.0–0.2)

## 2022-07-04 LAB — PHOSPHORUS: Phosphorus: 3.1 mg/dL (ref 2.5–4.6)

## 2022-07-04 LAB — MAGNESIUM: Magnesium: 2 mg/dL (ref 1.7–2.4)

## 2022-07-04 MED ORDER — PROSIGHT PO TABS: 1.0000 | ORAL_TABLET | Freq: Every day | ORAL | 0 refills | Status: DC

## 2022-07-04 MED ORDER — FOLIC ACID 1 MG PO TABS: 1.0000 mg | ORAL_TABLET | Freq: Every day | ORAL | 0 refills | Status: DC

## 2022-07-04 MED ORDER — THIAMINE HCL 100 MG PO TABS: 100.0000 mg | ORAL_TABLET | Freq: Every day | ORAL | 0 refills | Status: DC

## 2022-07-04 NOTE — Plan of Care (Deleted)
  Problem: Education: Goal: Knowledge of General Education information will improve Description: Including pain rating scale, medication(s)/side effects and non-pharmacologic comfort measures 07/04/2022 1202 by Iva Boop, RN Outcome: Progressing 07/04/2022 0742 by Iva Boop, RN Outcome: Progressing   Problem: Health Behavior/Discharge Planning: Goal: Ability to manage health-related needs will improve 07/04/2022 1202 by Iva Boop, RN Outcome: Progressing 07/04/2022 0742 by Iva Boop, RN Outcome: Progressing   Problem: Clinical Measurements: Goal: Ability to maintain clinical measurements within normal limits will improve Outcome: Progressing   Problem: Activity: Goal: Risk for activity intolerance will decrease 07/04/2022 1202 by Iva Boop, RN Outcome: Progressing 07/04/2022 0742 by Iva Boop, RN Outcome: Progressing   Problem: Nutrition: Goal: Adequate nutrition will be maintained 07/04/2022 1202 by Iva Boop, RN Outcome: Progressing 07/04/2022 0742 by Iva Boop, RN Outcome: Progressing   Problem: Coping: Goal: Level of anxiety will decrease Outcome: Progressing

## 2022-07-04 NOTE — Discharge Summary (Signed)
Physician Discharge Summary  [name] QIO:[id number] DOB: July 09, 1991  PCP: Pcp, No  Admitted from: Home Discharged to: Home  Admit date: 07/01/2022 Discharge date: 07/04/2022  Recommendations for Outpatient Follow-up:    Follow-up Information     Family Physician. Schedule an appointment as soon as possible for a visit in 1 week(s).   Why: To be seen with repeat labs (CBC & CMP).  Case manager at the hospital to assist with an appointment.                 Home Health: None    Equipment/Devices: None    Discharge Condition: Improved and stable.   Code Status: Full Code Diet recommendation:  Discharge Diet Orders (From admission, onward)     Start     Ordered   07/04/22 0000  Diet - low sodium heart healthy        07/04/22 1139             Discharge Diagnoses:  Principal Problem:   Alcohol withdrawal (HCC) Active Problems:   Allergic rhinitis   Chronic alcohol abuse   Brief Summary: 31 year old male, PMH significant for alcohol dependence, alcohol withdrawal, major depressive disorder, generalized anxiety disorder, presented for the second time in 24 hours to the ED with complaints of nausea, vomiting, tremors, anxiousness, intermittent diaphoresis and frontal headache, symptoms similar to prior episodes of alcohol withdrawal.  Reportedly has been seen in ED for alcohol withdrawal several times in the last year.  Seen in [facility] ED on 7/27 morning, treated with a dose of Ativan and discharged home (notation indicates that he wanted to continue follow-up outpatient and did not want inpatient treatment).  He returned later same evening to [facility] ED wanting inpatient treatment for alcohol detox, reportedly drank for both legs after leaving [facility] ED to avoid withdrawal symptoms.  Admitted for alcohol intoxication, and early withdrawal.  Gradually improved.     Assessment & Plan:   Alcohol dependence with intoxication and early withdrawal: History of chronic alcohol  dependence.  Exact amount of alcohol consumption unclear but as per H&P, consumes at least 6-7 8.5 ounce malt beverages per day with each malt beverage containing approximately 16% alcohol by volume.  Reportedly has been consuming alcohol on a daily basis like this for well over a year.  BAL in [facility] ED on 7/27: 77 followed later in [facility] ED of 228.  Admitted to progressive unit.  At risk for DTs and alcohol withdrawal seizures.  Reports that he has a sponsor for alcohol Anonymous but has not followed there lately and reports also having an addiction counselor through [employer] [facility].  TOC consulted.  He was placed on CIWA protocol.  He gradually improved.  He had high CIWA scores initially.  However today his CIWA scores have been 0 and 3 respectively.  Overall he feels much better and feels comfortable going home.  TOC has provided him outpatient resources for alcohol related rehab.  TOC was also consulted to assist him with finding a new PCP to follow.  History of alcohol withdrawal seizures:  Reports last seizure approximately 1 year ago.  Seizure precautions.  No seizures reported this hospital admission.  Generalized anxiety disorder and MDD:  Continue prior home dose of venlafaxine XR and Remeron.  Outpatient follow-up with behavioral health MD.   Allergic rhinitis: Continue prior home regimen.   Elevated blood pressures: Likely related to alcohol withdrawal.  Improved.  This needs to be closely monitored as outpatient by PCP.  Consultations: None  Procedures: None   Discharge Instructions  Discharge Instructions     Call MD for:   Complete by: As directed    Recurrent or worsening alcohol withdrawal symptoms.   Call MD for:  difficulty breathing, headache or visual disturbances   Complete by: As directed    Call MD for:  extreme fatigue   Complete by: As directed    Call MD for:  persistant dizziness or light-headedness   Complete by: As directed    Call MD for:  persistant  nausea and vomiting   Complete by: As directed    Call MD for:  severe uncontrolled pain   Complete by: As directed    Call MD for:  temperature >100.4   Complete by: As directed    Diet - low sodium heart healthy   Complete by: As directed    Increase activity slowly   Complete by: As directed         Medication List     STOP taking these medications    chlordiazePOXIDE 25 MG capsule Commonly known as: LIBRIUM       TAKE these medications    fluticasone 50 MCG/ACT nasal spray Commonly known as: FLONASE Place 1 spray into both nostrils 2 (two) times daily as needed for allergies.   folic acid 1 MG tablet Commonly known as: FOLVITE Take 1 tablet (1 mg total) by mouth daily. Start taking on: July 05, 2022   ibuprofen 200 MG tablet Commonly known as: ADVIL Take 400 mg by mouth daily.   levocetirizine 5 MG tablet Commonly known as: XYZAL Take 5 mg by mouth every evening.   mirtazapine 30 MG tablet Commonly known as: Remeron Take 1 tablet (30 mg total) by mouth at bedtime.   multivitamin Tabs tablet Take 1 tablet by mouth daily. Start taking on: July 05, 2022   ondansetron 4 MG disintegrating tablet Commonly known as: ZOFRAN-ODT Take 1 tablet (4 mg total) by mouth every 8 (eight) hours as needed for nausea or vomiting.   thiamine 100 MG tablet Commonly known as: VITAMIN B1 Take 1 tablet (100 mg total) by mouth daily. Start taking on: July 05, 2022   venlafaxine XR 75 MG 24 hr capsule Commonly known as: EFFEXOR-XR Take 1 capsule (75 mg total) by mouth daily with breakfast.       Allergies  Allergen Reactions   Benadryl [Diphenhydramine] Anxiety and Other (See Comments)    Increased anxiety   Codeine Nausea And Vomiting and Rash      Procedures/Studies: No results found.    Subjective: Overall feels much improved.  Minimal intermittent anxiety when he woke up but resolved after eating something and has not recurred.  He then expressed to RN  late morning that he feels comfortable going home.  No complaints reported.  Discharge Exam:  Vitals:   07/03/22 1501 07/03/22 2029 07/04/22 0506 07/04/22 1013  BP: (!) 145/95 (!) 142/97 (!) 146/106 (!) 145/90  Pulse: 78 88 74 79  Resp:  16 16   Temp:  99 F (37.2 C) 98.2 F (36.8 C)   TempSrc:  Oral    SpO2:  99% 100%   Weight:      Height:        General exam: Young male, moderately built and nourished lying comfortably propped up in bed without distress.  Oral mucosa moist. Respiratory system: Clear to auscultation.  No increased work of breathing. Cardiovascular system: S1 & S2 heard, RRR. No JVD,  murmurs, rubs, gallops or clicks. No pedal edema.  Telemetry personally reviewed: SR.  Occasional ST in the 120s, may be related to activity. Gastrointestinal system: Abdomen is nondistended, soft and nontender. No organomegaly or masses felt. Normal bowel sounds heard. Central nervous system: Alert and oriented. No focal neurological deficits. Extremities: Symmetric 5 x 5 power. Skin: No rashes, lesions or ulcers.  Diffuse body tattoos. Psychiatry: Judgement and insight appear normal. Mood & affect, appropriate and does not appear anxious or tremulous.    The results of significant diagnostics from this hospitalization (including imaging, microbiology, ancillary and laboratory) are listed below for reference.     Microbiology: No results found for this or any previous visit (from the past 240 hour(s)).   Labs: CBC: Recent Labs  Lab 07/01/22 0741 07/02/22 0330 07/03/22 0528 07/04/22 0448  WBC 4.0 4.8 4.6 5.0  NEUTROABS  --  1.5*  --   --   HGB 15.0 14.0 14.0 14.0  HCT 44.3 41.1 40.2 40.3  MCV 89.1 88.8 87.2 89.2  PLT 232 209 172 154    Basic Metabolic Panel: Recent Labs  Lab 07/01/22 0741 07/01/22 2008 07/02/22 0330 07/03/22 0528 07/04/22 0448  NA 139  --  140 144 139  K 4.3  --  3.8 3.6 3.6  CL 105  --  108 112* 104  CO2 24  --  25 25 25   GLUCOSE 99  --   78 98 103*  BUN 17  --  14 14 9   CREATININE 1.10  --  1.15 1.17 0.97  CALCIUM 8.4*  --  8.1* 8.8* 8.8*  MG  --  2.3 2.5*  --  2.0  PHOS  --   --  2.9  --  3.1    Liver Function Tests: Recent Labs  Lab 07/01/22 0831 07/02/22 0330 07/03/22 0528 07/04/22 0448  AST 69* 69* 58* 36  ALT 37 39 40 32  ALKPHOS 56 54 55 48  BILITOT 1.1 1.1 1.0 1.0  PROT 6.8 6.3* 6.3* 6.3*  ALBUMIN 3.7 3.3* 3.1* 3.2*       Time coordinating discharge: 25 minutes  SIGNED:  07/05/22, MD,  FACP, [facility], [facility], [facility] (Care Management Physician Certified). [facility]  To contact the attending provider between 7A-7P or the covering provider during after hours 7P-7A, please log into the web site [email or web address] and access using universal [place] password for that web site. If you do not have the password, please call the hospital operator.

## 2022-07-04 NOTE — Progress Notes (Signed)
AVS and discharge instructions reviewed w/ patient. Patient verbalized understanding and had no further questions. 

## 2022-07-04 NOTE — Plan of Care (Signed)

## 2022-07-15 ENCOUNTER — Encounter (HOSPITAL_COMMUNITY): Payer: Self-pay | Admitting: Psychiatry

## 2022-07-15 ENCOUNTER — Other Ambulatory Visit: Payer: Self-pay

## 2022-07-15 ENCOUNTER — Inpatient Hospital Stay (HOSPITAL_COMMUNITY)
Admission: AD | Admit: 2022-07-15 | Discharge: 2022-07-21 | DRG: 897 | Disposition: A | Discharge status: Home / Self Care | Payer: Commercial Managed Care - HMO | Attending: Psychiatry | Admitting: Psychiatry

## 2022-07-15 DIAGNOSIS — Z79899 Other long term (current) drug therapy: Secondary | ICD-10-CM

## 2022-07-15 DIAGNOSIS — F1024 Alcohol dependence with alcohol-induced mood disorder: Principal | ICD-10-CM | POA: Diagnosis present

## 2022-07-15 DIAGNOSIS — F411 Generalized anxiety disorder: Secondary | ICD-10-CM | POA: Diagnosis present

## 2022-07-15 DIAGNOSIS — F10232 Alcohol dependence with withdrawal with perceptual disturbance: Secondary | ICD-10-CM | POA: Diagnosis present

## 2022-07-15 DIAGNOSIS — R7989 Other specified abnormal findings of blood chemistry: Secondary | ICD-10-CM | POA: Diagnosis present

## 2022-07-15 DIAGNOSIS — E785 Hyperlipidemia, unspecified: Secondary | ICD-10-CM | POA: Diagnosis present

## 2022-07-15 DIAGNOSIS — Z20822 Contact with and (suspected) exposure to covid-19: Secondary | ICD-10-CM | POA: Diagnosis present

## 2022-07-15 DIAGNOSIS — F332 Major depressive disorder, recurrent severe without psychotic features: Secondary | ICD-10-CM

## 2022-07-15 DIAGNOSIS — F10930 Alcohol use, unspecified with withdrawal, uncomplicated: Secondary | ICD-10-CM

## 2022-07-15 DIAGNOSIS — I10 Essential (primary) hypertension: Secondary | ICD-10-CM | POA: Diagnosis present

## 2022-07-15 DIAGNOSIS — Z8616 Personal history of COVID-19: Secondary | ICD-10-CM

## 2022-07-15 DIAGNOSIS — F32A Depression, unspecified: Secondary | ICD-10-CM

## 2022-07-15 DIAGNOSIS — F1010 Alcohol abuse, uncomplicated: Secondary | ICD-10-CM

## 2022-07-15 DIAGNOSIS — Z818 Family history of other mental and behavioral disorders: Secondary | ICD-10-CM

## 2022-07-15 DIAGNOSIS — Z8249 Family history of ischemic heart disease and other diseases of the circulatory system: Secondary | ICD-10-CM | POA: Diagnosis not present

## 2022-07-15 DIAGNOSIS — F10932 Alcohol use, unspecified with withdrawal with perceptual disturbance: Secondary | ICD-10-CM | POA: Diagnosis present

## 2022-07-15 HISTORY — DX: Anxiety disorder, unspecified: F41.9

## 2022-07-15 HISTORY — DX: Essential (primary) hypertension: I10

## 2022-07-15 LAB — SARS CORONAVIRUS 2 BY RT PCR: SARS Coronavirus 2 by RT PCR: NEGATIVE

## 2022-07-15 MED ORDER — THIAMINE HCL 100 MG PO TABS
100.0000 mg | ORAL_TABLET | Freq: Every day | ORAL | Status: DC
  Administered 2022-07-16 – 2022-07-20 (×5): 100 mg via ORAL
  Filled 2022-07-15 (×9): qty 1

## 2022-07-15 MED ORDER — LEVOCETIRIZINE DIHYDROCHLORIDE 5 MG PO TABS: 5.0000 mg | ORAL_TABLET | Freq: Every evening | ORAL | Status: DC

## 2022-07-15 MED ORDER — FOLIC ACID 1 MG PO TABS
1.0000 mg | ORAL_TABLET | Freq: Every day | ORAL | Status: DC
  Administered 2022-07-16 – 2022-07-21 (×6): 1 mg via ORAL
  Filled 2022-07-15 (×8): qty 1

## 2022-07-15 MED ORDER — TRAZODONE HCL 50 MG PO TABS
50.0000 mg | ORAL_TABLET | Freq: Every evening | ORAL | Status: DC | PRN
  Administered 2022-07-17 – 2022-07-20 (×4): 50 mg via ORAL
  Filled 2022-07-15: qty 1
  Filled 2022-07-15: qty 7
  Filled 2022-07-15 (×3): qty 1

## 2022-07-15 MED ORDER — NICOTINE POLACRILEX 2 MG MT GUM
2.0000 mg | CHEWING_GUM | OROMUCOSAL | Status: DC | PRN
Start: 2022-07-15 — End: 2022-07-18
  Administered 2022-07-16 – 2022-07-18 (×6): 2 mg via ORAL
  Filled 2022-07-15 (×4): qty 1

## 2022-07-15 MED ORDER — ACETAMINOPHEN 325 MG PO TABS
650.0000 mg | ORAL_TABLET | Freq: Four times a day (QID) | ORAL | Status: DC | PRN
  Administered 2022-07-19 – 2022-07-20 (×2): 650 mg via ORAL
  Filled 2022-07-15 (×2): qty 2

## 2022-07-15 MED ORDER — MAGNESIUM HYDROXIDE 400 MG/5ML PO SUSP: 30.0000 mL | Freq: Every day | ORAL | Status: DC | PRN

## 2022-07-15 MED ORDER — VENLAFAXINE HCL ER 75 MG PO CP24
75.0000 mg | ORAL_CAPSULE | Freq: Every day | ORAL | Status: DC
  Administered 2022-07-16 – 2022-07-17 (×2): 75 mg via ORAL
  Filled 2022-07-15 (×3): qty 1

## 2022-07-15 MED ORDER — ALUM & MAG HYDROXIDE-SIMETH 200-200-20 MG/5ML PO SUSP: 30.0000 mL | ORAL | Status: DC | PRN

## 2022-07-15 MED ORDER — HYDROXYZINE HCL 25 MG PO TABS
25.0000 mg | ORAL_TABLET | Freq: Three times a day (TID) | ORAL | Status: DC | PRN
  Administered 2022-07-15 – 2022-07-20 (×9): 25 mg via ORAL
  Filled 2022-07-15 (×10): qty 1

## 2022-07-15 MED ORDER — FLUTICASONE PROPIONATE 50 MCG/ACT NA SUSP: 1.0000 | Freq: Two times a day (BID) | NASAL | Status: DC | PRN

## 2022-07-15 MED ORDER — PROSIGHT PO TABS
1.0000 | ORAL_TABLET | Freq: Every day | ORAL | Status: DC
  Filled 2022-07-15 (×2): qty 1

## 2022-07-15 MED ORDER — LORATADINE 10 MG PO TABS
10.0000 mg | ORAL_TABLET | Freq: Every evening | ORAL | Status: DC
  Administered 2022-07-15 – 2022-07-20 (×6): 10 mg via ORAL
  Filled 2022-07-15: qty 1
  Filled 2022-07-15 (×2): qty 7
  Filled 2022-07-15: qty 1
  Filled 2022-07-15: qty 7
  Filled 2022-07-15 (×4): qty 1

## 2022-07-15 MED ORDER — MIRTAZAPINE 30 MG PO TABS
30.0000 mg | ORAL_TABLET | Freq: Every day | ORAL | Status: DC
  Administered 2022-07-15 – 2022-07-20 (×6): 30 mg via ORAL
  Filled 2022-07-15 (×3): qty 1
  Filled 2022-07-15: qty 7
  Filled 2022-07-15: qty 1
  Filled 2022-07-15: qty 7
  Filled 2022-07-15: qty 1
  Filled 2022-07-15: qty 7
  Filled 2022-07-15 (×2): qty 1

## 2022-07-15 NOTE — Tx Team (Signed)
Initial Treatment Plan 07/15/2022 10:37 PM [name] [id number]:[id number]    PATIENT STRESSORS: Financial difficulties   Marital or family conflict   Substance abuse     PATIENT STRENGTHS: Motivation for treatment/growth  Supportive family/friends    PATIENT IDENTIFIED PROBLEMS: Alcoholism  Ineffective coping skills  Conflict with child's mother                 DISCHARGE CRITERIA:  Motivation to continue treatment in a less acute level of care Withdrawal symptoms are absent or subacute and managed without 24-hour nursing intervention  PRELIMINARY DISCHARGE PLAN: Return to previous living arrangement Return to previous work or school arrangements  PATIENT/FAMILY INVOLVEMENT: This treatment plan has been presented to and reviewed with the patient, [name].  The patient has been given the opportunity to ask questions and make suggestions.  Marja Kays, RN 07/15/2022, 10:37 PM

## 2022-07-15 NOTE — Progress Notes (Signed)
Admission Note:   [name] is a 31 y.o. male who presents voluntarily as a walk-in to [facility] for relapse from chronic alcohol dependence with alcohol-induced mood disorder and alcohol withdrawal.   Pt verbalized he drinks more than 10 beers a day. He denies drug use. Pt states he vapes everyday and requested nicotine gum. Pt denies SI/HI and AVH. Pt states he has increasing depression and conflict with his daughter's mother because of the drinking. He is with his daughter about 50% of the time. Pt verbalized wanting to work on his coping skills and his alcohol abuse while he is in the facility.   Skin was assessed and found to be clear of any abnormal marks. Pt searched and no contraband found, POC and unit policies explained and understanding verbalized. Consents obtained. Food and fluids offered, and fluids accepted. Pt had no additional questions or concerns.

## 2022-07-15 NOTE — H&P (Signed)
Behavioral Health Medical Screening Exam  HPI:  [name] is a 31 y.o. Caucasian male who presents involuntarily as a walk-in to [facility] for relapse from chronic alcohol dependence with alcohol-induced mood disorder and alcohol withdrawal.  Patient has past psychiatric/medical history of alcohol dependence with alcohol-induced mood disorder, alcohol withdrawal, alcohol withdrawal with perceptual disturbance, chronic alcohol abuse, generalized anxiety disorder, major depressive disorder recurrent episode severe, mild depression, suicide behavior with attempted self injury, and allergic rhinitis.  Patient lives in a duplex in [place], [place].  Chart review indicates patient has 14 ED visits from January 3rd 2023 to July 17th 2023, for severe alcohol related problems. Patient reports that he was admitted at [facility] for 4 days for medical detox in July 2023 and was discharged after treatment.  Reports that he was free from alcohol for only 1 day and relapsed back to alcohol drinking.  Reports he has been drinking for the past 12 years, with consumption of regular beer and malt type of beer of 12 bottles daily.  Reports triggers for his drinking is related to poor finances and poor relationship with the mother of her daughter.  Denies suicidal thoughts or plans, homicidal ideation, paranoia, delusion, auditory or visual hallucinations. Denies suicidal attempt, however, when confronted with the diagnosis of suicidal behavior with attempted self injury, patient stated that her girlfriend IVC'd him because he took some medication to help him go to sleep. Denies self injurious behavior, history of abuse or trauma, or access to firearms. Reports anxiety and rates as 10/10 on a scale of 0-10, with 10 being the worst. Reports symptoms of depression and characterizes as irritability, worthlessness, guilt, poor concentration and anhedonia. Endorses sleeping  for 3 hours only last night, and relates support system as his family. Reports being followed by a therapist and a psychiatrist from [facility] with Idalia Needle Cozart the NP, who manages his medications of Effexor and Remeron. Reports family history of mental illness with maternal side of his family diagnosed with schizophrenia and paternal side of the family diagnosed with bipolar disorder. Reports severe alcohol use and dependence with use of 12 bottles of above-mentioned beer daily especially in the morning to calm down his anxiety, with last drink 8 hours ago.  Reports taking Adderall 2 months ago.  Reports vaping nicotine and salt 500 mL every 2 weeks.  Instructions provided on cessation of polysubstance usage due to adverse consequences on the body system and his thinking process.  Assessment:  On assessment today, patient is seen face-to-face and examined in the conference room.  Appears sad and sleepy.  Chart reviewed and findings shared with the treatment team and consult with Dr. Lucianne Muss.  Patient alert and oriented x 4 and responds appropriately to questions ask.  Reports that he tried several times to withdrawal from alcohol but unable to follow through.  Reports being treated at [facility] detox treatment center, [facility] for alcohol related problems.  Speech clear and fluent with normal pattern and volume.  Able to maintain good eye contact with the provider.  Presents with anxious mood and congruent and appropriate affect presents with linear thought process and logical thought content memory, judgment, and insight fair.  Disposition: Based on my evaluation, the patient appears to have an emergency psychiatric/medical condition for which I recommend the patient be to be admitted to the Adult inpatient psychiatric Unit for stabilization from alcohol withdrawal symptoms, medication management and safety.  Total Time spent  with patient: 1 hour  Psychiatric Specialty  Exam:  Presentation  General Appearance: Appropriate for Environment; Casual; Fairly Groomed  Eye Contact:Good  Speech:Clear and Coherent; Normal Rate  Speech Volume:Normal  Handedness:Right  Mood and Affect  Mood:Anxious  Affect:Appropriate; Congruent  Thought Process  Thought Processes:Coherent; Linear  Descriptions of Associations:Intact  Orientation:Full (Time, Place and Person)  Thought Content:Logical  History of Schizophrenia/Schizoaffective disorder:No  Duration of Psychotic Symptoms:No data recorded Hallucinations:Hallucinations: None  Ideas of Reference:None  Suicidal Thoughts:Suicidal Thoughts: No  Homicidal Thoughts:Homicidal Thoughts: No  Sensorium  Memory:Immediate Fair; Recent Fair; Remote Fair  Judgment:Fair  Insight:Fair  Executive Functions  Concentration:Good  Attention Span:Good  Recall:Good  Fund of Knowledge:Fair  Language:Good  Psychomotor Activity  Psychomotor Activity:Psychomotor Activity: Increased; Restlessness  Assets  Assets:Communication Skills; Desire for Improvement; Social Support; Physical Health  Sleep  Sleep:Sleep: Poor Number of Hours of Sleep: 3  Physical Exam: Physical Exam Vitals and nursing note reviewed.  Constitutional:      Appearance: Normal appearance.  HENT:     Head: Normocephalic and atraumatic.     Right Ear: External ear normal.     Left Ear: External ear normal.     Nose: Nose normal.     Mouth/Throat:     Mouth: Mucous membranes are moist.     Pharynx: Oropharynx is clear.  Eyes:     Extraocular Movements: Extraocular movements intact.     Conjunctiva/sclera: Conjunctivae normal.     Pupils: Pupils are equal, round, and reactive to light.  Cardiovascular:     Rate and Rhythm: Normal rate.     Pulses: Normal pulses.     Comments: P 99, R 16, BP 155/116  Pulmonary:     Effort: Pulmonary effort is normal.  Abdominal:     Palpations: Abdomen is soft.  Genitourinary:     Comments: deferred Musculoskeletal:        General: Normal range of motion.     Cervical back: Normal range of motion and neck supple.  Skin:    General: Skin is warm.  Neurological:     General: No focal deficit present.     Mental Status: He is alert and oriented to person, place, and time.  Psychiatric:        Mood and Affect: Mood normal.        Behavior: Behavior normal.    Review of Systems  Constitutional: Negative.  Negative for chills and fever.  HENT: Negative.  Negative for hearing loss and tinnitus.   Eyes: Negative.  Negative for blurred vision and double vision.  Respiratory: Negative.  Negative for cough, sputum production, shortness of breath and wheezing.   Cardiovascular: Negative.  Negative for chest pain and palpitations.       P 99, R 16, BP 155/116   Gastrointestinal: Negative.  Negative for heartburn, nausea and vomiting.  Genitourinary: Negative.  Negative for dysuria, frequency and urgency.  Musculoskeletal: Negative.  Negative for myalgias and neck pain.  Skin: Negative.  Negative for itching and rash.  Neurological: Negative.  Negative for dizziness, tingling and headaches.  Endo/Heme/Allergies: Negative.  Negative for environmental allergies and polydipsia. Does not bruise/bleed easily.         Codeine Codeine  Nausea And Vomiting, Rash Low Allergy 07/16/2021 Deletion Reason:  Adverse Reactions/Drug Intolerances   Benadryl [Diphenhydramine] Benadryl [Diphenhydramine]  Anxiety, Other (See Comments) Medium Intolerance 06/30/2021 Increased anxiety    Psychiatric/Behavioral:  Positive for depression, substance abuse and suicidal ideas. The patient is nervous/anxious and  has insomnia.    There were no vitals taken for this visit. There is no height or weight on file to calculate BMI.  T=98.3, P 99, R 16, BP 155/116  Musculoskeletal: Strength & Muscle Tone: within normal limits Gait & Station: normal Patient leans: N/A  [place] Scale:  [place] Admission (Current) from OP Visit from 07/15/2022 in [place] [facility] Most recent reading at 07/15/2022  6:39 PM ED to Hosp-Admission (Discharged) from 07/01/2022 in [place] Most recent reading at 07/02/2022  2:12 PM ED from 07/01/2022 in [place] Most recent reading at 07/01/2022  7:25 AM  C-SSRS RISK CATEGORY Low Risk No Risk No Risk       Recommendations:  Based on my evaluation the patient appears to have an emergency psychiatric/medical condition for which I recommend the patient be to be admitted to the Adult inpatient psychiatric Unit.  Laretta Bolster, FNP 07/15/2022, 6:50 PM

## 2022-07-16 ENCOUNTER — Encounter (HOSPITAL_COMMUNITY): Payer: Self-pay

## 2022-07-16 LAB — HEPATIC FUNCTION PANEL
ALT: 39 U/L (ref 0–44)
AST: 49 U/L — ABNORMAL HIGH (ref 15–41)
Albumin: 4.2 g/dL (ref 3.5–5.0)
Alkaline Phosphatase: 49 U/L (ref 38–126)
Bilirubin, Direct: 0.1 mg/dL (ref 0.0–0.2)
Indirect Bilirubin: 0.8 mg/dL (ref 0.3–0.9)
Total Bilirubin: 0.9 mg/dL (ref 0.3–1.2)
Total Protein: 7.4 g/dL (ref 6.5–8.1)

## 2022-07-16 LAB — COMPREHENSIVE METABOLIC PANEL
ALT: 39 U/L (ref 0–44)
AST: 49 U/L — ABNORMAL HIGH (ref 15–41)
Albumin: 3.9 g/dL (ref 3.5–5.0)
Alkaline Phosphatase: 48 U/L (ref 38–126)
Anion gap: 6 (ref 5–15)
BUN: 16 mg/dL (ref 6–20)
CO2: 29 mmol/L (ref 22–32)
Calcium: 9 mg/dL (ref 8.9–10.3)
Chloride: 108 mmol/L (ref 98–111)
Creatinine, Ser: 1.07 mg/dL (ref 0.61–1.24)
GFR, Estimated: >60 mL/min (ref >60)
Glucose, Bld: 86 mg/dL (ref 70–99)
Potassium: 4.1 mmol/L (ref 3.5–5.1)
Sodium: 143 mmol/L (ref 135–145)
Total Bilirubin: 0.6 mg/dL (ref 0.3–1.2)
Total Protein: 7.3 g/dL (ref 6.5–8.1)

## 2022-07-16 LAB — RAPID URINE DRUG SCREEN, HOSP PERFORMED
Amphetamines: NOT DETECTED
Barbiturates: NOT DETECTED
Benzodiazepines: POSITIVE — AB
Cocaine: NOT DETECTED
Opiates: NOT DETECTED
Tetrahydrocannabinol: NOT DETECTED

## 2022-07-16 LAB — LIPID PANEL
Cholesterol: 209 mg/dL — ABNORMAL HIGH (ref 0–200)
HDL: 59 mg/dL (ref >40)
LDL Cholesterol: 73 mg/dL (ref 0–99)
Total CHOL/HDL Ratio: 3.5 RATIO
Triglycerides: 385 mg/dL — ABNORMAL HIGH (ref <150)
VLDL: 77 mg/dL — ABNORMAL HIGH (ref 0–40)

## 2022-07-16 LAB — URINALYSIS, COMPLETE (UACMP) WITH MICROSCOPIC
Bilirubin Urine: NEGATIVE
Glucose, UA: NEGATIVE mg/dL
Hgb urine dipstick: NEGATIVE
Ketones, ur: NEGATIVE mg/dL
Leukocytes,Ua: NEGATIVE
Nitrite: NEGATIVE
Protein, ur: NEGATIVE mg/dL
Specific Gravity, Urine: 1.019 (ref 1.005–1.030)
pH: 6 (ref 5.0–8.0)

## 2022-07-16 LAB — CBC
HCT: 44.8 % (ref 39.0–52.0)
Hemoglobin: 14.8 g/dL (ref 13.0–17.0)
MCH: 29.8 pg (ref 26.0–34.0)
MCHC: 33 g/dL (ref 30.0–36.0)
MCV: 90.1 fL (ref 80.0–100.0)
Platelets: 220 10*3/uL (ref 150–400)
RBC: 4.97 MIL/uL (ref 4.22–5.81)
RDW: 12.6 % (ref 11.5–15.5)
WBC: 5.3 10*3/uL (ref 4.0–10.5)
nRBC: 0 % (ref 0.0–0.2)

## 2022-07-16 LAB — HEMOGLOBIN A1C
Hgb A1c MFr Bld: 4.7 % — ABNORMAL LOW (ref 4.8–5.6)
Mean Plasma Glucose: 88.19 mg/dL

## 2022-07-16 LAB — TSH: TSH: 1.953 u[IU]/mL (ref 0.350–4.500)

## 2022-07-16 MED ORDER — ONDANSETRON 4 MG PO TBDP: 4.0000 mg | ORAL_TABLET | Freq: Four times a day (QID) | ORAL | Status: AC | PRN

## 2022-07-16 MED ORDER — CLONIDINE HCL 0.1 MG PO TABS
0.1000 mg | ORAL_TABLET | Freq: Four times a day (QID) | ORAL | Status: DC | PRN
  Administered 2022-07-16 – 2022-07-18 (×2): 0.1 mg via ORAL
  Filled 2022-07-16 (×2): qty 1

## 2022-07-16 MED ORDER — LOPERAMIDE HCL 2 MG PO CAPS: 2.0000 mg | ORAL_CAPSULE | ORAL | Status: AC | PRN

## 2022-07-16 MED ORDER — LORAZEPAM 1 MG PO TABS
2.0000 mg | ORAL_TABLET | Freq: Three times a day (TID) | ORAL | Status: AC
  Administered 2022-07-16 – 2022-07-17 (×3): 2 mg via ORAL
  Filled 2022-07-16 (×3): qty 2

## 2022-07-16 MED ORDER — LORAZEPAM 1 MG PO TABS
1.0000 mg | ORAL_TABLET | Freq: Four times a day (QID) | ORAL | Status: AC | PRN
Start: 2022-07-16 — End: 2022-07-19
  Administered 2022-07-17: 1 mg via ORAL
  Filled 2022-07-16: qty 1

## 2022-07-16 MED ORDER — LORAZEPAM 1 MG PO TABS
2.0000 mg | ORAL_TABLET | ORAL | Status: AC
  Administered 2022-07-16: 2 mg via ORAL
  Filled 2022-07-16: qty 2

## 2022-07-16 MED ORDER — THIAMINE HCL 100 MG/ML IJ SOLN
100.0000 mg | Freq: Once | INTRAMUSCULAR | Status: AC
  Administered 2022-07-16: 100 mg via INTRAMUSCULAR
  Filled 2022-07-16: qty 2

## 2022-07-16 MED ORDER — ADULT MULTIVITAMIN W/MINERALS CH
1.0000 | ORAL_TABLET | Freq: Every day | ORAL | Status: DC
  Administered 2022-07-16 – 2022-07-21 (×6): 1 via ORAL
  Filled 2022-07-16 (×9): qty 1

## 2022-07-16 MED ORDER — LORAZEPAM 1 MG PO TABS
1.0000 mg | ORAL_TABLET | Freq: Three times a day (TID) | ORAL | Status: AC
  Administered 2022-07-17 – 2022-07-18 (×3): 1 mg via ORAL
  Filled 2022-07-16 (×3): qty 1

## 2022-07-16 MED ORDER — LORAZEPAM 0.5 MG PO TABS
0.5000 mg | ORAL_TABLET | Freq: Three times a day (TID) | ORAL | Status: AC
  Administered 2022-07-18 – 2022-07-19 (×3): 0.5 mg via ORAL
  Filled 2022-07-16 (×3): qty 1

## 2022-07-16 NOTE — B[facility] Group Notes (Signed)
Adult Psychoeducational Group Note  Date:  07/16/2022 Time:  2:41 PM  Group Topic/Focus:  Managing Feelings:   The focus of this group is to identify what feelings patients have difficulty handling and develop a plan to handle them in a healthier way upon discharge.  Participation Level:  Active  Participation Quality:  Appropriate  Affect:  Appropriate  Cognitive:  Alert  Insight: Appropriate  Engagement in Group:  Engaged  Modes of Intervention:  Activity  Additional Comments:  Patient attended and participated in the emotional regulation group activity.  Jearl Klinefelter 07/16/2022, 2:41 PM

## 2022-07-16 NOTE — BHH Counselor (Signed)
Adult Comprehensive Assessment  Patient ID: [name], male   DOB: 07/30/1991, 31 y.o.   MRN: [id number]  Information Source: Information source: Patient  Current Stressors:  Patient states their primary concerns and needs for treatment are:: "Managing my withdrawal symptoms while I detox. 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 I don't make enough which causes stress." Housing / Lack of housing: "No problems, I live by myself" Physical health (include injuries & life threatening diseases): "None" Social relationships: "The relationship with my daughter's mother is stressful. In the past we've taken 50 B's out on each other, been through two custody battles. We want different things." Substance abuse: "Yes, I need help with detoxing. I don't want to drink but my body has withdrawals" Bereavement / Loss: None  Living/Environment/Situation:  Living Arrangements: Alone Living conditions (as described by patient or guardian): "It's good, it's just me until my daughter comes over" Who else lives in the home?: "My daughter lives with me half the time" How long has patient lived in current situation?: "About 2 years" What is atmosphere in current home: Comfortable  Family History:  Marital status: Single Are you sexually active?: No What is your sexual orientation?: "Heterosexual" Has your sexual activity been  affected by drugs, alcohol, medication, or emotional stress?: "No" Does patient have children?: Yes How many children?: 1 How is patient's relationship with their children?: "I have a great relationship with my 65 year old daughter"  Childhood History:  By whom was/is the patient raised?: Both parents, Grandparents Additional childhood history information: None added Description of patient's relationship with caregiver when they were a child: "I had a great relationship with my parents" Patient's description of current relationship with people who raised him/her: "It's really good, they are my main supports" How were you disciplined when you got in trouble as a child/adolescent?: "I really wasn't disciplined. I had free reign to do what I wanted to do" Does patient have siblings?: Yes Number of Siblings: 1 Description of patient's current relationship with siblings: "I have 1 sister with autism, it's a good relationship" Did patient suffer any verbal/emotional/physical/sexual abuse as a child?: No Did patient suffer from severe childhood neglect?: No Has patient ever been sexually abused/assaulted/raped as an adolescent or adult?: No Was the patient ever a victim of a crime or a disaster?: No Witnessed domestic violence?: No Has patient been affected by domestic violence as an adult?: No  Education:  Highest grade of school patient has completed: IT consultant degree Currently a student?: No Learning disability?: No  Employment/Work Situation:   Employment Situation: Employed 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?: No  Financial Resources:   Surveyor, quantity resources: Sales executive, Income from employment Does patient have a Lawyer or guardian?: No  Alcohol/Substance Abuse:   What has been your use of drugs/alcohol within the last 12 months?: "Beer, I drink about 14, 12 ounce beers a day" If attempted suicide, did drugs/alcohol play a role in this?: No Alcohol/Substance Abuse Treatment Hx: Past detox, Attends AA/NA If yes, describe treatment: "I attend AA with my friend at a church for about 3 months off and on. I have completed a detox program for about 3 full days" Has alcohol/substance abuse ever caused legal problems?: No  Social Support System:   Patient's Community Support System: Good Describe Community Support System: "My parents are my main support and I have friends who are sober who help me when I need it." Type of faith/religion: "No" How does patient's faith help to cope with current illness?: "No"  Leisure/Recreation:   Do You Have Hobbies?: Yes Leisure and Hobbies: "I like to play the guitar, piano, exercise and skateboard"  Strengths/Needs:   What is the patient's perception of their strengths?: "I am strongwilled and a good dad" Patient states they can use these personal strengths during their treatment to contribute to their recovery: "I'll be determined to succeed in treatment" Patient states these barriers may affect/interfere with their treatment: "I have no barriers" Patient states these barriers may affect their return to the community: n/a Other important information patient would like considered in planning for their treatment: "Nothing else to add"  Discharge Plan:   Currently receiving community mental health services: No Patient states concerns and preferences for aftercare planning are: "I would like to be connected with a psychiatrist and a therapist" Patient states they will  know when they are safe and ready for discharge when: "When I'm not shaky and nauseous" Does patient have access to transportation?: Yes ("My mother can come pick me up") Does patient have financial barriers related to discharge medications?: No Patient description of barriers related to discharge medications: "None" Will patient be returning to same living situation after discharge?: Yes  Summary/Recommendations:   Summary and Recommendations (to be completed by the evaluator): Patient is a 31 y.o., male who presents to the [facility] as a walk-in and gets admitted for evaluation and 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 Patient reports stressors to included dissatisfaction with current job, lack of financial resources, stressful relationship with the mother of his child and withdrawal symptoms from alcohol. Patient did not endorse SI, also denying HI and AVH. Patient reports last alcohol use was right before admission with no other substance use reported. Pt does not currently receive any other community supports and has requested referrals to have a psychiatrist and therapist post discharge. Patient will benefit from crisis stabilization, medication evaluation, group therapy and psychoeducation, in addition to case management for discharge planning. At discharge it is recommended that Patient adhere to the established discharge plan and continue in treatment.  Veva Holes. LCSW-A 07/16/2022

## 2022-07-16 NOTE — H&P (Signed)
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   Initial assessment on 07/16/2022, patient was evaluated on the inpatient unit, the patient reports history of diagnosed GAD for at least a few years for which he received treatment at behavioral health urgent care as noted below.  He reports drinking alcohol daily for at least 10 years 10-12 bottles of beer daily with history of withdrawals when attempts to quit multiple detox attempts this year noted he reports currently detox symptoms including shakes and tremors, sweating, nausea, feeling cold, earlier today was noted to have withdrawals and was given 1 dose of Ativan 2 mg with significant help reported, CIWA was obtained earlier prior to given Ativan was 10, also was noted to have high blood pressure systolic and diastolic with him reporting no known history of hypertension diagnosis but will follow.  Patient reports some feeling depressed lately but mainly related to inability to quit drinking reports fluctuating sleep decreased appetite fair to decreased energy level decreased interest and motivation fair concentration admits to anhedonia and feeling guilty toward his family that he is drinking and unable to quit reports feeling hopeless helpless and worthless last time few months ago only related to inability to quit drinking denies any passive or active SI at this time denies active suicidal ideation  intention or plan, denies HI or AVH, no paranoia or other delusions reported.  Denies symptoms consistent with mania or hypomania, denies symptoms consistent with PTSD.  He does report stressors recently related to poor finances and relationship problems with his daughter's mother and work stressors.  Chart review: At least 14 emergency room visits from January to July this year with severe alcohol dependence and multiple attempts to detox lasting only few days then relapses.  [facility] indicates multiple outpatient detox attempts with Librium with 3 days refills of Librium in the past and recently  Psych meds prior to admission: Remeron 30 mg at bedtime and Effexor extended release 75 mg daily  Sleep Sleep:Sleep: Poor Number of Hours of Sleep: 3   Collateral information: None noted  Past Psychiatric History:  Prior Psychiatric diagnoses: GAD Past Psychiatric Hospitalizations: Twice to this facility in 2013 and 2019 for alcohol detox  History of self mutilation: Denies Past suicide attempts: Denies Past history of HI, violent or aggressive behavior: Denies  Past Psychiatric medications trials: History of treatment with Zoloft for anxiety did not help, currently on Remeron 30 mg at bedtime and Effexor extended release 75 mg daily for the past 3 years with fairly good effect for anxiety but believes Effexor is not helping, only believes Remeron is helpful without any side effects including any weight gain Tried gabapentin in the past but caused increased anxiety, he tried naltrexone in the past for few days but unsure of effect. History of ECT/TMS: Denies  Outpatient psychiatric Follow up: Behavioral health urgent care last time a month ago Prior Outpatient Therapy: Behavioral health urgent care last time in April    Is the patient at risk to self? No.  Has the patient been a risk to self in the past 6 months? No.  Has the patient been a risk to self within the distant past? No.  Is the  patient a risk to others? No.  Has the patient been a risk to others in the past 6 months? No.  Has the patient been a risk to others within the distant past? No.    Substance Use History: Alcohol: Started drinking age 23 years old has been drinking daily for at least 10 years average 10-12 bottles of beer, attempted to quit several times especially during the last year multiple detox attempts last only few days then relapses, denies craving.  Reports longest time clean 90 days twice in the past 10 years last time 1-1/2 years ago. Tobacoo: Vapes daily Marijuana: History of delta 8 use 15 years ago Cocaine: History of cocaine use on and off last use 4 years ago Stimulants: Denies IV drug use: Denies Opiates: Denies Prescribed Meds abuse: Denies H/O withdrawals, blackouts, DTs: History of severe withdrawals in the past with history of withdrawal seizures twice in 2017 History of Detox / Rehab: Has been to [place] detox up last 20 days rehab treatment in 2017 then followed outpatient, also history of IOP in 2013 DUI: Once in 2017  Alcohol Screening: 1. How often do you have a drink containing alcohol?: 4 or more times a week 2. How many drinks containing alcohol do you have on a typical day when you are drinking?: 10 or more 3. How often do you have six or more drinks on one occasion?: Daily or almost daily AUDIT-C Score: 12 4. How often during the last year have you found that you were not able to stop drinking once you had started?: Daily or almost daily 5. How often during the last year have you failed to do what was normally expected from you because of drinking?: Daily or almost daily 6. How often during the last year have you needed a first drink in the morning to get yourself going after a heavy drinking session?: Daily or almost daily 7. How often during the last year have you had a feeling of guilt of remorse after drinking?: Daily or almost daily 8. How often during the last year have  you been unable to remember what happened the night before because you had been drinking?: Daily or almost daily 9. Have you or someone else been injured as a result of your drinking?: No 10. Has a relative or friend or a doctor or another health worker been concerned about your drinking or suggested you cut down?: No Alcohol Use Disorder Identification Test Final Score (AUDIT): 32 Alcohol Brief Interventions/Follow-up: Alcohol education/Brief advice  Substance Abuse History in the last 12 months:  Yes.  Alcohol   Tobacco Screening: Vapes daily    Past Medical/Surgical History:  Past Medical History:  Diagnosis Date   Alcohol dependence (HCC)    Anxiety    COVID-19    Hypertension    Suicidal behavior with attempted self-injury [facility])    History reviewed. No pertinent surgical history.  Family History:  Family History  Problem Relation Age of Onset   Lupus Mother    Hypertension Father     Family Psychiatric History:  Psychiatric illness: Multiple family members on father side had Alzheimer's, multiple family members on mother side have anxiety and bipolar disorder, sister has bipolar disorder. 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, 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 Legal history: Denies pending charges or court dates, has been to jail only once when had DUI in 2017 Trauma: Denies Access to guns:  Denies   Allergies:   Allergies  Allergen Reactions   Benadryl [Diphenhydramine] Anxiety and Other (See Comments)    Increased anxiety   Codeine Nausea And Vomiting and Rash    Lab Results:  Results for orders placed or performed during the hospital encounter of 07/15/22 (from the past 48 hour(s))  SARS Coronavirus 2 by RT PCR (hospital order, performed in [facility] hospital lab) *cepheid single result test* Anterior Nasal Swab     Status: None   Collection Time: 07/15/22  6:34 PM   Specimen: Anterior Nasal Swab  Result Value Ref Range   SARS Coronavirus 2 by RT PCR NEGATIVE NEGATIVE    Comment: (NOTE) SARS-CoV-2 target nucleic acids are NOT DETECTED.  The SARS-CoV-2 RNA is generally detectable in upper and lower respiratory specimens during the acute phase of infection. The lowest concentration of SARS-CoV-2 viral copies this assay can detect is 250 copies / mL. A negative result does not preclude SARS-CoV-2 infection and should not be used as the sole basis for treatment or other patient management decisions.  A negative result may occur with improper specimen collection / handling, submission of specimen other than nasopharyngeal swab, presence of viral mutation(s) within the areas targeted by this assay, and inadequate number of viral copies (<250 copies / mL). A negative result must be combined with clinical observations, patient history, and epidemiological information.  Fact Sheet for Patients:   RoadLapTop.co.za  Fact Sheet for Healthcare Providers: http://kim-miller.com/  This test is not yet approved or  cleared by the [place] FDA and has been authorized for detection and/or diagnosis of SARS-CoV-2 by FDA under an Emergency Use Authorization (EUA).  This EUA will remain in effect (meaning this test can be used) for the duration of the COVID-19 declaration under Section 564(b)(1) of the Act, 21 U.S.C. section  360bbb-3(b)(1), unless the authorization is terminated or revoked sooner.  Performed at [facility], [address], [place], [place] [address]   Urinalysis, Complete w Microscopic Urine, Clean Catch     Status: Abnormal   Collection Time: 07/15/22  8:45 PM  Result Value Ref Range   Color, Urine YELLOW YELLOW   APPearance CLEAR CLEAR   Specific Gravity, Urine 1.019 1.005 - 1.030   pH 6.0 5.0 - 8.0   Glucose, UA NEGATIVE NEGATIVE mg/dL   Hgb urine dipstick NEGATIVE NEGATIVE   Bilirubin Urine NEGATIVE NEGATIVE   Ketones, ur NEGATIVE NEGATIVE mg/dL   Protein, ur NEGATIVE NEGATIVE mg/dL   Nitrite NEGATIVE NEGATIVE   Leukocytes,Ua NEGATIVE NEGATIVE   RBC / HPF 0-5 0 - 5 RBC/hpf   WBC, UA 0-5 0 - 5 WBC/hpf   Bacteria, UA RARE (A) NONE SEEN   Mucus PRESENT     Comment: Performed at [facility], [address] [place], [place] [address]  Rapid urine drug screen (hospital performed) not at [facility]     Status: Abnormal   Collection Time: 07/15/22  8:45 PM  Result Value Ref Range   Opiates NONE DETECTED NONE DETECTED   Cocaine NONE DETECTED NONE DETECTED   Benzodiazepines POSITIVE (A) NONE DETECTED   Amphetamines NONE DETECTED NONE DETECTED   Tetrahydrocannabinol NONE DETECTED NONE DETECTED   Barbiturates NONE DETECTED NONE DETECTED    Comment: (NOTE) DRUG SCREEN FOR MEDICAL PURPOSES ONLY.  IF CONFIRMATION IS NEEDED FOR ANY PURPOSE, NOTIFY LAB WITHIN 5 DAYS.  LOWEST DETECTABLE LIMITS FOR URINE DRUG SCREEN Drug Class                     Cutoff (ng/mL) Amphetamine and metabolites    1000 Barbiturate and metabolites    200 Benzodiazepine                 200 Tricyclics and metabolites     300 Opiates and metabolites        300 Cocaine and metabolites        300 THC                            50 Performed at [facility], [address] [place], [place] [address]   CBC     Status: None   Collection Time: 07/16/22  6:59 AM   Result Value Ref Range   WBC 5.3 4.0 - 10.5 K/uL   RBC 4.97 4.22 - 5.81 MIL/uL   Hemoglobin 14.8 13.0 - 17.0 g/dL   HCT 60.4 54.0 - 98.1 %   MCV 90.1 80.0 - 100.0 fL   MCH 29.8 26.0 - 34.0 pg   MCHC 33.0 30.0 - 36.0 g/dL   RDW 19.1 47.8 - 29.5 %   Platelets 220 150 - 400 K/uL   nRBC 0.0 0.0 - 0.2 %    Comment: Performed at [facility], [address], [place], [place] [address]  Comprehensive metabolic panel     Status: Abnormal   Collection Time: 07/16/22  6:59 AM  Result Value Ref Range   Sodium 143 135 - 145 mmol/L   Potassium 4.1 3.5 - 5.1 mmol/L   Chloride 108 98 - 111 mmol/L   CO2 29 22 - 32 mmol/L   Glucose, Bld 86 70 - 99 mg/dL    Comment: Glucose reference range applies only to samples taken after fasting for at least 8 hours.   BUN 16 6 - 20 mg/dL   Creatinine, Ser 8.65 0.61 - 1.24 mg/dL   Calcium 9.0 8.9 - 78.4 mg/dL   Total Protein 7.3 6.5 - 8.1 g/dL   Albumin 3.9 3.5 - 5.0 g/dL   AST 49 (H) 15 - 41 U/L   ALT 39 0 - 44 U/L   Alkaline Phosphatase 48 38 - 126 U/L   Total Bilirubin 0.6 0.3 - 1.2 mg/dL   GFR, Estimated >69 >62 mL/min    Comment: (NOTE) Calculated using the CKD-EPI Creatinine Equation (2021)    Anion gap 6 5 - 15    Comment: Performed at [facility], [address] [place], [place] [address]  Hemoglobin A1c     Status: Abnormal   Collection Time: 07/16/22  6:59 AM  Result Value Ref Range   Hgb A1c MFr Bld 4.7 (L) 4.8 - 5.6 %    Comment: (NOTE) Pre diabetes:  5.7%-6.4%  Diabetes:              >6.4%  Glycemic control for   <7.0% adults with diabetes    Mean Plasma Glucose 88.19 mg/dL    Comment: Performed at [facility], [address], [place], [place] [address]  Lipid panel     Status: Abnormal   Collection Time: 07/16/22  6:59 AM  Result Value Ref Range   Cholesterol 209 (H) 0 - 200 mg/dL   Triglycerides 616 (H) <150 mg/dL   HDL 59 >07 mg/dL   Total CHOL/HDL Ratio 3.5 RATIO    VLDL 77 (H) 0 - 40 mg/dL   LDL Cholesterol 73 0 - 99 mg/dL    Comment:        Total Cholesterol/HDL:CHD Risk Coronary Heart Disease Risk Table                     Men   Women  1/2 Average Risk   3.4   3.3  Average Risk       5.0   4.4  2 X Average Risk   9.6   7.1  3 X Average Risk  23.4   11.0        Use the calculated Patient Ratio above and the CHD Risk Table to determine the patient's CHD Risk.        ATP III CLASSIFICATION (LDL):  <100     mg/dL   Optimal  371-062  mg/dL   Near or Above                    Optimal  130-159  mg/dL   Borderline  694-854  mg/dL   High  >627     mg/dL   Very High Performed at [facility], [address] [place], [place] [address]   TSH     Status: None   Collection Time: 07/16/22  6:59 AM  Result Value Ref Range   TSH 1.953 0.350 - 4.500 uIU/mL    Comment: Performed by a 3rd Generation assay with a functional sensitivity of <=0.01 uIU/mL. Performed at [facility], [address] [place], [place] [address]   Hepatic function panel     Status: Abnormal   Collection Time: 07/16/22  6:59 AM  Result Value Ref Range   Total Protein 7.4 6.5 - 8.1 g/dL   Albumin 4.2 3.5 - 5.0 g/dL   AST 49 (H) 15 - 41 U/L   ALT 39 0 - 44 U/L   Alkaline Phosphatase 49 38 - 126 U/L   Total Bilirubin 0.9 0.3 - 1.2 mg/dL   Bilirubin, Direct 0.1 0.0 - 0.2 mg/dL   Indirect Bilirubin 0.8 0.3 - 0.9 mg/dL    Comment: Performed at [facility], [address], [place], [place] [address]    Blood Alcohol level:  Lab Results  Component Value Date   ETH 228 (H) 07/01/2022   ETH 77 (H) 07/01/2022    Metabolic Disorder Labs:  Lab Results  Component Value Date   HGBA1C 4.7 (L) 07/16/2022   MPG 88.19 07/16/2022   No results found for: "PROLACTIN" Lab Results  Component Value Date   CHOL 209 (H) 07/16/2022   TRIG 385 (H) 07/16/2022   HDL 59 07/16/2022   CHOLHDL 3.5 07/16/2022   VLDL 77 (H) 07/16/2022    LDLCALC 73 07/16/2022    Current Medications: Current Facility-Administered Medications  Medication Dose Route  Frequency Provider Last Rate Last Admin   acetaminophen (TYLENOL) tablet 650 mg  650 mg Oral Q6H PRN Ntuen, Jesusita Oka, FNP       alum & mag hydroxide-simeth (MAALOX/MYLANTA) 200-200-20 MG/5ML suspension 30 mL  30 mL Oral Q4H PRN Ntuen, Jesusita Oka, FNP       fluticasone (FLONASE) 50 MCG/ACT nasal spray 1 spray  1 spray Each Nare BID PRN Sindy Guadeloupe, NP       folic acid (FOLVITE) tablet 1 mg  1 mg Oral Daily Sindy Guadeloupe, NP   1 mg at 07/16/22 1610   hydrOXYzine (ATARAX) tablet 25 mg  25 mg Oral TID PRN Cecilie Lowers, FNP   25 mg at 07/15/22 2136   loperamide (IMODIUM) capsule 2-4 mg  2-4 mg Oral PRN Sarita Bottom, MD       loratadine (CLARITIN) tablet 10 mg  10 mg Oral QPM Abbott Pao, Erick Murin, MD   10 mg at 07/15/22 2136   LORazepam (ATIVAN) tablet 2 mg  2 mg Oral TID Sarita Bottom, MD       Followed by   Melene Muller ON 07/17/2022] LORazepam (ATIVAN) tablet 1 mg  1 mg Oral TID Sarita Bottom, MD       Followed by   Melene Muller ON 07/18/2022] LORazepam (ATIVAN) tablet 0.5 mg  0.5 mg Oral TID Abbott Pao, Carmello Cabiness, MD       LORazepam (ATIVAN) tablet 1 mg  1 mg Oral Q6H PRN Abbott Pao, Waunita Sandstrom, MD       magnesium hydroxide (MILK OF MAGNESIA) suspension 30 mL  30 mL Oral Daily PRN Ntuen, Jesusita Oka, FNP       mirtazapine (REMERON) tablet 30 mg  30 mg Oral QHS Sindy Guadeloupe, NP   30 mg at 07/15/22 2136   multivitamin with minerals tablet 1 tablet  1 tablet Oral Daily Abbott Pao, Raja Liska, MD   1 tablet at 07/16/22 0839   nicotine polacrilex (NICORETTE) gum 2 mg  2 mg Oral PRN Sindy Guadeloupe, NP   2 mg at 07/16/22 0840   ondansetron (ZOFRAN-ODT) disintegrating tablet 4 mg  4 mg Oral Q6H PRN Abbott Pao, Golden Gilreath, MD       thiamine (VITAMIN B1) tablet 100 mg  100 mg Oral Daily Sindy Guadeloupe, NP   100 mg at 07/16/22 9604   traZODone (DESYREL) tablet 50 mg  50 mg Oral QHS PRN Ntuen, Jesusita Oka, FNP       venlafaxine XR (EFFEXOR-XR) 24 hr capsule 75  mg  75 mg Oral Q breakfast Sindy Guadeloupe, NP   75 mg at 07/16/22 5409    PTA Medications: Medications Prior to Admission  Medication Sig Dispense Refill Last Dose   fluticasone (FLONASE) 50 MCG/ACT nasal spray Place 1 spray into both nostrils 2 (two) times daily as needed for allergies.      levocetirizine (XYZAL) 5 MG tablet Take 5 mg by mouth every evening.      mirtazapine (REMERON) 30 MG tablet Take 1 tablet (30 mg total) by mouth at bedtime. 30 tablet 3    venlafaxine XR (EFFEXOR-XR) 75 MG 24 hr capsule Take 1 capsule (75 mg total) by mouth daily with breakfast. (Patient taking differently: Take 150 mg by mouth daily with breakfast.) 30 capsule 3     Musculoskeletal: Strength & Muscle Tone: within normal limits Gait & Station: normal Patient leans: N/A   Physical Findings: AIMS:  , ,  ,  ,    CIWA:  CIWA-Ar Total: 10 COWS:     Psychiatric Specialty  Exam:  General Appearance: Fairly dressed and groomed, with an average hygiene, appears at stated age  Behavior: Cooperative and pleasant  Psychomotor Activity: Mild psychomotor retardation noted  Mild to moderate hand and leg tremors noted  Eye Contact: Fair yet limited Speech: Normal amount, decreased tone and volume   Mood: Dysphoric, anxious Affect: Congruent, shaking hands and legs probably related to anxiety and withdrawal  Thought Process: Linear, goal directed Descriptions of Associations: Intact Thought Content: Hallucinations: Denies AH, VH, does not appear responding to stimuli Delusions: No paranoia or other delusions noted Suicidal Thoughts: Denies SI, intention, plan  Homicidal Thoughts: Denies HI, intention, plan   Alertness/Orientation: Alert and fully oriented but not formally tested  Insight: poor Judgment: poor  Memory: Intact, able to provide detailed history information  Executive Functions  Concentration: Fair Attention Span: Fair Recall: Intact Fund of Knowledge: Fair   Physical  Exam:  Physical Exam Constitutional:      Appearance: Normal appearance.  HENT:     Head: Normocephalic and atraumatic.  Pulmonary:     Effort: Pulmonary effort is normal.  Skin:    General: Skin is warm and dry.  Neurological:     General: No focal deficit present.     Mental Status: He is alert and oriented to person, place, and time.    Review of Systems  Constitutional: Negative.   HENT: Negative.    Eyes: Negative.   Respiratory: Negative.    Cardiovascular: Negative.   Skin: Negative.   Neurological: Negative.    Blood pressure (!) 136/110, pulse 77, temperature 98.1 F (36.7 C), temperature source Oral, resp. rate 18, height 6\' 1"  (1.854 m), weight 97.1 kg, SpO2 100 %. Body mass index is 28.23 kg/m.   Assets  Assets:Communication Skills; Desire for Improvement; Social Support; Physical Health    Treatment Plan Summary:   ASSESSMENT:  Principal Diagnosis: Alcohol dependence with alcohol-induced mood disorder (HCC) Diagnosis:  Principal Problem:   Alcohol dependence with alcohol-induced mood disorder (HCC) Active Problems:   Alcohol withdrawal, with perceptual disturbance (HCC)   Generalized anxiety disorder   PLAN: Safety and Monitoring:  -- Voluntary admission to inpatient psychiatric unit for safety, stabilization and treatment  -- Daily contact with patient to assess and evaluate symptoms and progress in treatment  -- Patient's case to be discussed in multi-disciplinary team meeting  -- Observation Level : q15 minute checks  -- Vital signs:  q12 hours  -- Precautions: suicide, elopement, and assault  2. Medications:   Start Ativan scheduled taper for alcohol withdrawal   Monitor CIWA with Ativan as needed, adjust the scheduled dosing accordingly continue home Remeron 30 mg at bedtime, prescribed for anxiety, also helping for sleep  Continue home Effexor xr 75 mg daily for anxiety, consider tapering down per patient's request in the next few  days  Started trazodone 50 mg at bedtime as needed for sleep  Start Atarax 25 mg 3 times daily as needed for anxiety  Clonidine as needed for elevated blood pressure, will monitor and consider starting long-term medication for hypertension if needed  Patient reported he tried gabapentin in the past and did not help, he is reluctant to start naltrexone for alcohol dependence and craving at this time, will follow.   The risks/benefits/side-effects/alternatives to this medication were discussed in detail with the patient and time was given for questions. The patient consents to medication trial.    3. Labs Reviewed: CMP no significant abnormalities noted except elevated AST slightly 49, lipid panel  elevated cholesterol 209 and triglycerides 385, CBC within normal level, hemoglobin A1c 4.7, TSH 1.95, UA no UTI noted, alcohol level was not checked prior to this admission but last checked 228 07/01/2022, tox screen positive for benzodiazepine      Lab ordered: None   4. Tobacco Use Disorder  -- Nicorette gum for smoking cessation  -- Smoking cessation encouraged  5. Group and Therapy: -- Encouraged patient to participate in unit milieu and in scheduled group therapies   --Substance Use counseling: Patient was counseled regard to need to abstain from alcohol use completely after discharge.  At time of initial assessment patient refuses inpatient residential rehab recommended after discharge he only is willing to do outpatient rehab treatment, will follow.  6. Discharge Planning:   -- Social work and case management to assist with discharge planning and identification of hospital follow-up needs prior to discharge  -- Estimated LOS: 5-7 days  -- Discharge Concerns: Need to establish a safety plan; Medication compliance and effectiveness  -- Discharge Goals: Return home with outpatient referrals for mental health follow-up including medication management/psychotherapy  At time of discharge patient  need to be scheduled with PCP to follow in regard to abnormal lipid panel and the possible need for medication for hypertension.  The patient is agreeable with the medication plan, as above. We will monitor the patient's response to pharmacologic treatment, and adjust medications as necessary. Patient is encouraged to participate in group therapy while admitted to the psychiatric unit. We will address other chronic and acute stressors, which contributed to the patient's increased alcohol dependence and anxiety, in order to reduce the risk of self-harm at discharge.   Physician Treatment Plan for Primary Diagnosis: Alcohol dependence with alcohol-induced mood disorder (HCC) Long Term Goal(s): Improvement in symptoms so as ready for discharge  Short Term Goals: Ability to identify changes in lifestyle to reduce recurrence of condition will improve, Ability to verbalize feelings will improve, Ability to disclose and discuss suicidal ideas, Ability to demonstrate self-control will improve, and Ability to identify and develop effective coping behaviors will improve   I certify that inpatient services furnished can reasonably be expected to improve the patient's condition.    Total Time Spent in Direct Patient Care:  I personally spent 55 minutes on the unit in direct patient care. The direct patient care time included face-to-face time with the patient, reviewing the patient's chart, communicating with other professionals, and coordinating care. Greater than 50% of this time was spent in counseling or coordinating care with the patient regarding goals of hospitalization, psycho-education, and discharge planning needs.    Sarita BottomNadir Green [place], MD 8/11/20232:49 PM

## 2022-07-16 NOTE — Progress Notes (Signed)
Pt is on ativan taper for alcohol withdrawal.  Pt endorses feeling shaky, sweaty, and anxious.  Latest CIWA at 1600 was 6.  Pt given clonidine 0.1mg  for increased blood pressure.  Pt is pleasant and cooperative.  Pt is taking medications without incident and no adverse reactions are noted.  RN will continue to monitor pt's progress and provide support as needed.

## 2022-07-16 NOTE — Group Note (Signed)
[facility] LCSW Group Therapy Note   Group Date: 07/16/2022 Start Time: 1300 End Time: 1400   Type of Therapy and Topic: Group Therapy: Avoiding Self-Sabotaging and Enabling Behaviors  Participation Level: Active  Description of Group:  In this group, patients will learn how to identify obstacles, self-sabotaging and enabling behaviors, as well as: what are they, why do we do them and what needs these behaviors meet. Discuss unhealthy relationships and how to have positive healthy boundaries with those that sabotage and enable. Explore aspects of self-sabotage and enabling in yourself and how to limit these self-destructive behaviors in everyday life.   Therapeutic Goals: 1. Patient will identify one obstacle that relates to self-sabotage and enabling behaviors 2. Patient will identify one personal self-sabotaging or enabling behavior they did prior to admission 3. Patient will state a plan to change the above identified behavior 4. Patient will demonstrate ability to communicate their needs through discussion and/or role play.    Summary of Patient Progress: Patient was present for the entirety of the group session. Patient was an active listener and participated in the topic of discussion, provided helpful advice to others, and added nuance to topic of conversation.    Therapeutic Modalities:  Cognitive Behavioral Therapy Person-Centered Therapy Motivational Interviewing    [name], LCSWA 

## 2022-07-16 NOTE — BH IP Treatment Plan (Signed)
Interdisciplinary Treatment and Diagnostic Plan Update  07/16/2022 Time of Session: 0830 [name] MRN: [id number]  Principal Diagnosis: Alcohol dependence with alcohol-induced mood disorder (HCC)  Secondary Diagnoses: Principal Problem:   Alcohol dependence with alcohol-induced mood disorder (HCC)   Current Medications:  Current Facility-Administered Medications  Medication Dose Route Frequency Provider Last Rate Last Admin   acetaminophen (TYLENOL) tablet 650 mg  650 mg Oral Q6H PRN Ntuen, Jesusita Oka, FNP       alum & mag hydroxide-simeth (MAALOX/MYLANTA) 200-200-20 MG/5ML suspension 30 mL  30 mL Oral Q4H PRN Ntuen, Jesusita Oka, FNP       fluticasone (FLONASE) 50 MCG/ACT nasal spray 1 spray  1 spray Each Nare BID PRN Sindy Guadeloupe, NP       folic acid (FOLVITE) tablet 1 mg  1 mg Oral Daily Sindy Guadeloupe, NP   1 mg at 07/16/22 8182   hydrOXYzine (ATARAX) tablet 25 mg  25 mg Oral TID PRN Cecilie Lowers, FNP   25 mg at 07/15/22 2136   loperamide (IMODIUM) capsule 2-4 mg  2-4 mg Oral PRN Sarita Bottom, MD       loratadine (CLARITIN) tablet 10 mg  10 mg Oral QPM Attiah, Nadir, MD   10 mg at 07/15/22 2136   LORazepam (ATIVAN) tablet 1 mg  1 mg Oral Q6H PRN Abbott Pao, Nadir, MD       magnesium hydroxide (MILK OF MAGNESIA) suspension 30 mL  30 mL Oral Daily PRN Ntuen, Jesusita Oka, FNP       mirtazapine (REMERON) tablet 30 mg  30 mg Oral QHS Sindy Guadeloupe, NP   30 mg at 07/15/22 2136   multivitamin with minerals tablet 1 tablet  1 tablet Oral Daily Abbott Pao, Nadir, MD   1 tablet at 07/16/22 0839   nicotine polacrilex (NICORETTE) gum 2 mg  2 mg Oral PRN Sindy Guadeloupe, NP   2 mg at 07/16/22 0840   ondansetron (ZOFRAN-ODT) disintegrating tablet 4 mg  4 mg Oral Q6H PRN Abbott Pao, Nadir, MD       thiamine (VITAMIN B1) tablet 100 mg  100 mg Oral Daily Sindy Guadeloupe, NP   100 mg at 07/16/22 0838   traZODone (DESYREL) tablet 50 mg  50 mg Oral QHS PRN Ntuen, Jesusita Oka, FNP       venlafaxine XR (EFFEXOR-XR) 24 hr capsule 75  mg  75 mg Oral Q breakfast Sindy Guadeloupe, NP   75 mg at 07/16/22 9937   PTA Medications: Medications Prior to Admission  Medication Sig Dispense Refill Last Dose   fluticasone (FLONASE) 50 MCG/ACT nasal spray Place 1 spray into both nostrils 2 (two) times daily as needed for allergies.      levocetirizine (XYZAL) 5 MG tablet Take 5 mg by mouth every evening.      mirtazapine (REMERON) 30 MG tablet Take 1 tablet (30 mg total) by mouth at bedtime. 30 tablet 3    venlafaxine XR (EFFEXOR-XR) 75 MG 24 hr capsule Take 1 capsule (75 mg total) by mouth daily with breakfast. (Patient taking differently: Take 150 mg by mouth daily with breakfast.) 30 capsule 3     Patient Stressors: Financial difficulties   Marital or family conflict   Substance abuse    Patient Strengths: Motivation for treatment/growth  Supportive family/friends   Treatment Modalities: Medication Management, Group therapy, Case management,  1 to 1 session with clinician, Psychoeducation, Recreational therapy.   Physician Treatment Plan for Primary Diagnosis: Alcohol dependence with alcohol-induced mood disorder (HCC) Long  Term Goal(s):     Short Term Goals:    Medication Management: Evaluate patient's response, side effects, and tolerance of medication regimen.  Therapeutic Interventions: 1 to 1 sessions, Unit Group sessions and Medication administration.  Evaluation of Outcomes: Progressing  Physician Treatment Plan for Secondary Diagnosis: Principal Problem:   Alcohol dependence with alcohol-induced mood disorder (HCC)  Long Term Goal(s):     Short Term Goals:       Medication Management: Evaluate patient's response, side effects, and tolerance of medication regimen.  Therapeutic Interventions: 1 to 1 sessions, Unit Group sessions and Medication administration.  Evaluation of Outcomes: Progressing   RN Treatment Plan for Primary Diagnosis: Alcohol dependence with alcohol-induced mood disorder (HCC) Long Term  Goal(s): Knowledge of disease and therapeutic regimen to maintain health will improve  Short Term Goals: Ability to remain free from injury will improve, Ability to verbalize frustration and anger appropriately will improve, Ability to demonstrate self-control, Ability to participate in decision making will improve, Ability to verbalize feelings will improve, Ability to disclose and discuss suicidal ideas, Ability to identify and develop effective coping behaviors will improve, and Compliance with prescribed medications will improve  Medication Management: RN will administer medications as ordered by provider, will assess and evaluate patient's response and provide education to patient for prescribed medication. RN will report any adverse and/or side effects to prescribing provider.  Therapeutic Interventions: 1 on 1 counseling sessions, Psychoeducation, Medication administration, Evaluate responses to treatment, Monitor vital signs and CBGs as ordered, Perform/monitor CIWA, COWS, AIMS and Fall Risk screenings as ordered, Perform wound care treatments as ordered.  Evaluation of Outcomes: Progressing   LCSW Treatment Plan for Primary Diagnosis: Alcohol dependence with alcohol-induced mood disorder (HCC) Long Term Goal(s): Safe transition to appropriate next level of care at discharge, Engage patient in therapeutic group addressing interpersonal concerns.  Short Term Goals: Engage patient in aftercare planning with referrals and resources, Increase social support, Increase ability to appropriately verbalize feelings, Increase emotional regulation, Facilitate acceptance of mental health diagnosis and concerns, Facilitate patient progression through stages of change regarding substance use diagnoses and concerns, Identify triggers associated with mental health/substance abuse issues, and Increase skills for wellness and recovery  Therapeutic Interventions: Assess for all discharge needs, 1 to 1 time with  Social worker, Explore available resources and support systems, Assess for adequacy in community support network, Educate family and significant other(s) on suicide prevention, Complete Psychosocial Assessment, Interpersonal group therapy.  Evaluation of Outcomes: Progressing   Progress in Treatment: Attending groups: No. Participating in groups: No. Taking medication as prescribed: Yes. Toleration medication: Yes. Family/Significant other contact made: No, will contact:  CSW will obtain consent to reach collateral.  Patient understands diagnosis: Yes. Discussing patient identified problems/goals with staff: Yes. Medical problems stabilized or resolved: Yes. Denies suicidal/homicidal ideation: No. Issues/concerns per patient self-inventory: Yes. Other: none  New problem(s) identified: No, Describe:  none   New Short Term/Long Term Goal(s): Patient to work towards detox, medication management for mood stabilization; elimination of SI thoughts; development of comprehensive mental wellness/sobriety plan.  Patient Goals:  Patient states their goal for treatment is to "get back into outpatient and quit (substance use)."  Discharge Plan or Barriers: No psychosocial barriers identified at this time, patient to return to place of residence when appropriate for discharge.   Reason for Continuation of Hospitalization: Depression Other; describe active substance use.   Estimated Length of Stay:1-7 days  Scribe for Treatment Team: Almedia Balls 07/16/2022 12:24 PM

## 2022-07-16 NOTE — Progress Notes (Signed)
   07/16/22 2100  Psych Admission Type (Psych Patients Only)  Admission Status Voluntary  Psychosocial Assessment  Patient Complaints Anxiety  Eye Contact Fair  Facial Expression Anxious  Affect Anxious  Speech Logical/coherent  Interaction Assertive  Motor Activity Fidgety  Appearance/Hygiene Disheveled  Behavior Characteristics Anxious  Mood Anxious  Aggressive Behavior  Effect No apparent injury  Thought Process  Coherency Circumstantial  Content Blaming others  Delusions None reported or observed  Perception WDL  Hallucination None reported or observed  Judgment Impaired  Confusion None  Danger to Self  Current suicidal ideation? Denies  Danger to Others  Danger to Others None reported or observed

## 2022-07-16 NOTE — [facility] Suicide Risk Assessment (Signed)
[facility] Admission Suicide Risk Assessment   Nursing information obtained from:  Patient Demographic factors:  Male Current Mental Status:  NA Loss Factors:  Financial problems / change in socioeconomic status Historical Factors:  NA Risk Reduction Factors:  Responsible for children under 32 years of age, Sense of responsibility to family, Positive social support  Total Time spent with patient: 1 hour Principal Problem: Alcohol dependence with alcohol-induced mood disorder (HCC) Diagnosis:  Principal Problem:   Alcohol dependence with alcohol-induced mood disorder (HCC) Active Problems:   Alcohol withdrawal, with perceptual disturbance (HCC)   Generalized anxiety disorder  Subjective Data: [name] is a 31 y.o., male with a past psychiatric history significant for severe alcohol dependence with history of withdrawal seizures, GAD, 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    Initial assessment on 07/16/2022, patient was evaluated on the inpatient unit, the patient reports history of diagnosed GAD for at least a few years for which he received treatment at behavioral health urgent care as noted below.  He reports drinking alcohol daily for at least 10 years 10-12 bottles of beer daily with history of withdrawals when attempts to quit multiple detox attempts this year noted he reports currently detox symptoms including shakes and tremors, sweating, nausea, feeling cold, earlier today was noted to have withdrawals and was given 1 dose of Ativan 2 mg with significant help reported, CIWA was obtained earlier prior to given Ativan was 10, also was noted to have high blood pressure systolic and diastolic with him reporting no known history of hypertension diagnosis but will follow.  Patient reports some feeling depressed lately but mainly  related to inability to quit drinking reports fluctuating sleep decreased appetite fair to decreased energy level decreased interest and motivation fair concentration admits to anhedonia and feeling guilty toward his family that he is drinking and unable to quit reports feeling hopeless helpless and worthless last time few months ago only related to inability to quit drinking denies any passive or active SI at this time denies active suicidal ideation intention or plan, denies HI or AVH, no paranoia or other delusions reported.  Denies symptoms consistent with mania or hypomania, denies symptoms consistent with PTSD.  He does report stressors recently related to poor finances and relationship problems with his daughter's mother and work stressors.  Continued Clinical Symptoms:  Alcohol Use Disorder Identification Test Final Score (AUDIT): 32 The "Alcohol Use Disorders Identification Test", Guidelines for Use in Primary Care, Second Edition.  World Science writer [facility]). Score between 0-7:  no or low risk or alcohol related problems. Score between 8-15:  moderate risk of alcohol related problems. Score between 16-19:  high risk of alcohol related problems. Score 20 or above:  warrants further diagnostic evaluation for alcohol dependence and treatment.   CLINICAL FACTORS:   Alcohol/Substance Abuse/Dependencies   Musculoskeletal: Strength & Muscle Tone: within normal limits Gait & Station: normal Patient leans: N/A  Psychiatric Specialty Exam:  Presentation  General Appearance: Appropriate for Environment; Casual; Fairly Groomed  Eye Contact:Good  Speech:Clear and Coherent; Normal Rate  Speech Volume:Normal  Handedness:Right   Mood and Affect  Mood:Anxious  Affect:Appropriate; Congruent   Thought Process  Thought Processes:Coherent; Linear  Descriptions of Associations:Intact  Orientation:Full (Time, Place and Person)  Thought Content:Logical  History of  Schizophrenia/Schizoaffective disorder:No  Duration of Psychotic Symptoms:No data recorded Hallucinations:Hallucinations: None  Ideas of Reference:None  Suicidal Thoughts:Suicidal Thoughts: No  Homicidal Thoughts:Homicidal Thoughts: No   Sensorium  Memory:Immediate Fair; Recent Fair; Remote Fair  Judgment:Fair  Insight:Fair   Executive Functions  Concentration:Good  Attention Span:Good  Recall:Good  Fund of Knowledge:Fair  Language:Good   Psychomotor Activity  Psychomotor Activity:Psychomotor Activity: Increased; Restlessness   Assets  Assets:Communication Skills; Desire for Improvement; Social Support; Physical Health   Sleep  Sleep:Sleep: Poor Number of Hours of Sleep: 3    Physical Exam: Physical Exam ROS Blood pressure (!) 136/110, pulse 77, temperature 98.1 F (36.7 C), temperature source Oral, resp. rate 18, height 6\' 1"  (1.854 m), weight 97.1 kg, SpO2 100 %. Body mass index is 28.23 kg/m.   COGNITIVE FEATURES THAT CONTRIBUTE TO RISK:  Thought constriction (tunnel vision)    SUICIDE RISK:   Moderate:  Frequent suicidal ideation with limited intensity, and duration, some specificity in terms of plans, no associated intent, good self-control, limited dysphoria/symptomatology, some risk factors present, and identifiable protective factors, including available and accessible social support.  PLAN OF CARE:  Safety and Monitoring:             -- Voluntary admission to inpatient psychiatric unit for safety, stabilization and treatment             -- Daily contact with patient to assess and evaluate symptoms and progress in treatment             -- Patient's case to be discussed in multi-disciplinary team meeting             -- Observation Level : q15 minute checks             -- Vital signs:  q12 hours             -- Precautions: suicide, elopement, and assault   2. Medications:              Start Ativan scheduled taper for alcohol withdrawal               Monitor CIWA with Ativan as needed, adjust the scheduled dosing accordingly continue home Remeron 30 mg at bedtime, prescribed for anxiety, also helping for sleep             Continue home Effexor xr 75 mg daily for anxiety, consider tapering down per patient's request in the next few days             Started trazodone 50 mg at bedtime as needed for sleep             Start Atarax 25 mg 3 times daily as needed for anxiety             Clonidine as needed for elevated blood pressure, will monitor and consider starting long-term medication for hypertension if needed             Patient reported he tried gabapentin in the past and did not help, he is reluctant to start naltrexone for alcohol dependence and craving at this time, will follow.    The risks/benefits/side-effects/alternatives to this medication were discussed in detail with the patient and time was given for questions. The patient consents to medication trial.               3. Labs Reviewed: CMP no significant abnormalities noted except elevated AST slightly 49, lipid panel elevated cholesterol 209 and triglycerides 385, CBC within normal level, hemoglobin A1c 4.7, TSH 1.95, UA no UTI noted, alcohol level  was not checked prior to this admission but last checked 228 07/01/2022, tox screen positive for benzodiazepine        Lab ordered: None   4. Tobacco Use Disorder             -- Nicorette gum for smoking cessation             -- Smoking cessation encouraged   5. Group and Therapy: -- Encouraged patient to participate in unit milieu and in scheduled group therapies              --Substance Use counseling: Patient was counseled regard to need to abstain from alcohol use completely after discharge.   At time of initial assessment patient refuses inpatient residential rehab recommended after discharge he only is willing to do outpatient rehab treatment, will follow.   6. Discharge Planning:              -- Social work and case  management to assist with discharge planning and identification of hospital follow-up needs prior to discharge             -- Estimated LOS: 5-7 days             -- Discharge Concerns: Need to establish a safety plan; Medication compliance and effectiveness             -- Discharge Goals: Return home with outpatient referrals for mental health follow-up including medication management/psychotherapy   I certify that inpatient services furnished can reasonably be expected to improve the patient's condition.   Kaylany Tesoriero Abbott Pao, MD 07/16/2022, 3:22 PM

## 2022-07-17 MED ORDER — NALTREXONE HCL 50 MG PO TABS
25.0000 mg | ORAL_TABLET | Freq: Every day | ORAL | Status: DC
  Administered 2022-07-17: 25 mg via ORAL
  Filled 2022-07-17 (×2): qty 1

## 2022-07-17 MED ORDER — LISINOPRIL 10 MG PO TABS
10.0000 mg | ORAL_TABLET | Freq: Every day | ORAL | Status: DC
  Administered 2022-07-17 – 2022-07-18 (×2): 10 mg via ORAL
  Filled 2022-07-17 (×3): qty 1

## 2022-07-17 MED ORDER — VENLAFAXINE HCL ER 37.5 MG PO CP24
37.5000 mg | ORAL_CAPSULE | Freq: Every day | ORAL | Status: AC
  Administered 2022-07-18 – 2022-07-20 (×3): 37.5 mg via ORAL
  Filled 2022-07-17 (×3): qty 1

## 2022-07-17 NOTE — Progress Notes (Signed)
Adult Psychoeducational Group Note  Date:  07/17/2022 Time:  8:23 PM  Group Topic/Focus:  Wrap-Up Group:   The focus of this group is to help patients review their daily goal of treatment and discuss progress on daily workbooks.  Participation Level:  Active  Participation Quality:  Appropriate  Affect:  Appropriate  Cognitive:  Appropriate  Insight: Appropriate  Engagement in Group:  Engaged  Modes of Intervention:  Education and Exploration  Additional Comments:  Patient attended and participated in group tonight. He reports that the thing he has learn while he has been here that he could take with him is to have positive self thought.  Lita [facility] 07/17/2022, 8:23 PM

## 2022-07-17 NOTE — Group Note (Signed)
Date:  07/17/2022 Time:  9:22 AM  Group Topic/Focus:  Goals Group:   The focus of this group is to help patients establish daily goals to achieve during treatment and discuss how the patient can incorporate goal setting into their daily lives to aide in recovery. Orientation:   The focus of this group is to educate the patient on the purpose and policies of crisis stabilization and provide a format to answer questions about their admission.  The group details unit policies and expectations of patients while admitted.    Participation Level:  Active  Participation Quality:  Appropriate  Affect:  Appropriate  Cognitive:  Appropriate  Insight: Appropriate  Engagement in Group:  Engaged  Modes of Intervention:  Discussion  Additional Comments:  Pt wants to talk to the DR and SW about discharge planning.  Jaquita Rector 07/17/2022, 9:22 AM

## 2022-07-17 NOTE — [facility] Group Notes (Signed)
Psychoeducational Group Note    Date:  07/17/2022 Time: 1300-1400    Purpose of Group: . The group focus' on teaching patients on how to identify their needs and their Life Skills:  A group where two lists are made. What people need and what are things that we do that are unhealthy. The lists are developed by the patients and it is explained that we often do the actions that are not healthy to get our list of needs met.  Goal:: to develop the coping skills needed to get their needs met  Participation Level:  Active  Participation Quality:  Appropriate  Affect:  Appropriate  Cognitive:  Oriented  Insight: Improving  Engagement in Group:  Engaged  Modes of Intervention:  Activity, Discussion, Education, and Support  Additional Comments:  Rates energy at a 7/10, Participated fully in the group.  Paulino Rily

## 2022-07-17 NOTE — Group Note (Signed)
LCSW Group Therapy Note No social work group was held today due to newly separated halls necessitating a higher number of groups, a significant number of expected and unexpected discharges, a number of necessary Suicide Prevention Education calls to family members, and a high number of admissions that required initial psychosocial assessments.  The following was provided to each patient on 300 and 500 halls in lieu of in-person group:  Healthy vs. Unhealthy Coping Skills and Supports   Unhealthy Qualities                                             Healthy Qualities Works (at first) Works   Stops working or starts hurting Continues working  Fast Usually takes time to develop  Easy Often difficult to learn  Usually a habit Usually unknown, has to become a habit  Can do alone Often need to reach out for help   Leads to loss Leads to gain         My Unhealthy Coping Skills                                    My Healthy Coping Skills                       My Unhealthy Supports                                           My Healthy Supports                         Dovid Bartko Grossman-Orr, LCSW 07/17/2022  2:42 PM     

## 2022-07-17 NOTE — Progress Notes (Signed)
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 The patient is currently on Hospital Day 2.   Chart Review from last 24 hours:  The patient's chart was reviewed and nursing notes were reviewed. The patient's case was discussed in multidisciplinary team meeting. Per MAR compliant with scheduled medication including Ativan taper, chart review indicates CIWA score 4 earlier this morning but 11 around 1140 requiring as needed Ativan in addition to scheduled. Chart review indicates patient continues to have elevated blood pressure readings, receiving as needed clonidine.  Information Obtained Today During Patient Interview: The patient was seen and evaluated on the unit. On assessment today the patient reports improving tremors and withdrawals in general with better sleep last night noted.  He does report less withdrawals including sweating but mild nausea this morning as well, reports good appetite, continues to deny SI HI or AVH.  Reports willing to taper down Effexor given he feels Remeron is the main help for his long-term anxiety, agrees to taper down Effexor gradually to avoid withdrawals.  He reports some craving on and off when he tries to stop drinking and agrees to a trial of naltrexone to help with long-term alcohol dependence.  He continues to refuse inpatient residential rehab despite the fact that I discussed with him that given severity of his drinking problem it is definitely recommended in his case but he continues to refuse.  He is motivated to do intensive outpatient rehab treatment after  discharge, will follow. Discussed with patient severity of his drinking problem given severe withdrawals, history of withdrawal seizures as well as multiple relapses, also discussed recommendation to abstain completely from alcohol after discharge.  He reports good support from his mother and father. During discussion today patient reports history of on and off elevated blood pressure readings but was never diagnosed with hypertension probably related to him not seeing a primary care provider on outpatient basis.  Discussed with patient elevated blood pressure since admission with very slight improvement with treatment of withdrawals, discussed with patient we will start on lisinopril for hypertension and continue to follow.  Sleep  Sleep: Improved  Principal Problem: Alcohol dependence with alcohol-induced mood disorder (HCC) Diagnosis: Principal Problem:   Alcohol dependence with alcohol-induced mood disorder (HCC) Active Problems:   Alcohol withdrawal, with perceptual disturbance (HCC)   Generalized anxiety disorder    Past Psychiatric History: Prior Psychiatric diagnoses: GAD Past Psychiatric Hospitalizations: Twice to this facility in 2013 and 2019 for alcohol detox   History of self mutilation: Denies Past suicide attempts: Denies Past history of HI, violent or aggressive behavior: Denies   Past Psychiatric medications trials: History of treatment with Zoloft for anxiety did not help, currently on Remeron 30 mg at bedtime and Effexor extended release 75 mg daily for the past 3 years with fairly good effect for anxiety but believes Effexor is not helping, only believes Remeron is helpful without any side effects including any weight gain Tried gabapentin in the past but caused increased anxiety, he tried naltrexone in the past for few days but unsure of effect. History of ECT/TMS: Denies   Outpatient  psychiatric Follow up: Behavioral health urgent care last time a month ago Prior  Outpatient Therapy: Behavioral health urgent care last time in April  Past Medical History:  Past Medical History:  Diagnosis Date   Alcohol dependence (HCC)    Anxiety    COVID-19    Hypertension    Suicidal behavior with attempted self-injury [facility])    History reviewed. No pertinent surgical history. Family History:  Family History  Problem Relation Age of Onset   Lupus Mother    Hypertension Father    Family Psychiatric  History: See H&P Social History: See H&P  Current Medications: Current Facility-Administered Medications  Medication Dose Route Frequency Provider Last Rate Last Admin   acetaminophen (TYLENOL) tablet 650 mg  650 mg Oral Q6H PRN Ntuen, Jesusita Oka, FNP       alum & mag hydroxide-simeth (MAALOX/MYLANTA) 200-200-20 MG/5ML suspension 30 mL  30 mL Oral Q4H PRN Ntuen, Jesusita Oka, FNP       cloNIDine (CATAPRES) tablet 0.1 mg  0.1 mg Oral Q6H PRN Gerrard Crystal, MD   0.1 mg at 07/16/22 1604   fluticasone (FLONASE) 50 MCG/ACT nasal spray 1 spray  1 spray Each Nare BID PRN Sindy Guadeloupe, NP       folic acid (FOLVITE) tablet 1 mg  1 mg Oral Daily Sindy Guadeloupe, NP   1 mg at 07/17/22 0816   hydrOXYzine (ATARAX) tablet 25 mg  25 mg Oral TID PRN Cecilie Lowers, FNP   25 mg at 07/17/22 1142   loperamide (IMODIUM) capsule 2-4 mg  2-4 mg Oral PRN Sarita Bottom, MD       loratadine (CLARITIN) tablet 10 mg  10 mg Oral QPM Yocelin Vanlue, MD   10 mg at 07/16/22 1812   LORazepam (ATIVAN) tablet 1 mg  1 mg Oral TID Sarita Bottom, MD       Followed by   Melene Muller ON 07/18/2022] LORazepam (ATIVAN) tablet 0.5 mg  0.5 mg Oral TID Abbott [name], Dorion Petillo, MD       LORazepam (ATIVAN) tablet 1 mg  1 mg Oral Q6H PRN Abbott [name], Zareena Willis, MD   1 mg at 07/17/22 1142   magnesium hydroxide (MILK OF MAGNESIA) suspension 30 mL  30 mL Oral Daily PRN Ntuen, Jesusita Oka, FNP       mirtazapine (REMERON) tablet 30 mg  30 mg Oral QHS Sindy Guadeloupe, NP   30 mg at 07/16/22 2113   multivitamin with minerals tablet 1 tablet  1 tablet Oral  Daily Abbott [name], Avyaan Summer, MD   1 tablet at 07/17/22 0816   nicotine polacrilex (NICORETTE) gum 2 mg  2 mg Oral PRN Sindy Guadeloupe, NP   2 mg at 07/17/22 1142   ondansetron (ZOFRAN-ODT) disintegrating tablet 4 mg  4 mg Oral Q6H PRN Abbott [name], Gladies Sofranko, MD       thiamine (VITAMIN B1) tablet 100 mg  100 mg Oral Daily Sindy Guadeloupe, NP   100 mg at 07/17/22 0816   traZODone (DESYREL) tablet 50 mg  50 mg Oral QHS PRN Ntuen, Jesusita Oka, FNP       venlafaxine XR (EFFEXOR-XR) 24 hr capsule 75 mg  75 mg Oral Q breakfast Sindy Guadeloupe, NP   75 mg at 07/17/22 7654    Lab Results:  Results for orders placed or performed during the hospital encounter of 07/15/22 (from the past 48 hour(s))  SARS Coronavirus 2 by RT PCR (hospital order, performed in [facility] hospital lab) *cepheid single result test* Anterior Nasal Swab  Status: None   Collection Time: 07/15/22  6:34 PM   Specimen: Anterior Nasal Swab  Result Value Ref Range   SARS Coronavirus 2 by RT PCR NEGATIVE NEGATIVE    Comment: (NOTE) SARS-CoV-2 target nucleic acids are NOT DETECTED.  The SARS-CoV-2 RNA is generally detectable in upper and lower respiratory specimens during the acute phase of infection. The lowest concentration of SARS-CoV-2 viral copies this assay can detect is 250 copies / mL. A negative result does not preclude SARS-CoV-2 infection and should not be used as the sole basis for treatment or other patient management decisions.  A negative result may occur with improper specimen collection / handling, submission of specimen other than nasopharyngeal swab, presence of viral mutation(s) within the areas targeted by this assay, and inadequate number of viral copies (<250 copies / mL). A negative result must be combined with clinical observations, patient history, and epidemiological information.  Fact Sheet for Patients:   RoadLapTop.co.za  Fact Sheet for Healthcare  Providers: http://kim-miller.com/  This test is not yet approved or  cleared by the [place] FDA and has been authorized for detection and/or diagnosis of SARS-CoV-2 by FDA under an Emergency Use Authorization (EUA).  This EUA will remain in effect (meaning this test can be used) for the duration of the COVID-19 declaration under Section 564(b)(1) of the Act, 21 U.S.C. section 360bbb-3(b)(1), unless the authorization is terminated or revoked sooner.  Performed at [facility], [address], [place], [place] [address]   Urinalysis, Complete w Microscopic Urine, Clean Catch     Status: Abnormal   Collection Time: 07/15/22  8:45 PM  Result Value Ref Range   Color, Urine YELLOW YELLOW   APPearance CLEAR CLEAR   Specific Gravity, Urine 1.019 1.005 - 1.030   pH 6.0 5.0 - 8.0   Glucose, UA NEGATIVE NEGATIVE mg/dL   Hgb urine dipstick NEGATIVE NEGATIVE   Bilirubin Urine NEGATIVE NEGATIVE   Ketones, ur NEGATIVE NEGATIVE mg/dL   Protein, ur NEGATIVE NEGATIVE mg/dL   Nitrite NEGATIVE NEGATIVE   Leukocytes,Ua NEGATIVE NEGATIVE   RBC / HPF 0-5 0 - 5 RBC/hpf   WBC, UA 0-5 0 - 5 WBC/hpf   Bacteria, UA RARE (A) NONE SEEN   Mucus PRESENT     Comment: Performed at [facility], [address], [place], [place] [address]  Rapid urine drug screen (hospital performed) not at [facility]     Status: Abnormal   Collection Time: 07/15/22  8:45 PM  Result Value Ref Range   Opiates NONE DETECTED NONE DETECTED   Cocaine NONE DETECTED NONE DETECTED   Benzodiazepines POSITIVE (A) NONE DETECTED   Amphetamines NONE DETECTED NONE DETECTED   Tetrahydrocannabinol NONE DETECTED NONE DETECTED   Barbiturates NONE DETECTED NONE DETECTED    Comment: (NOTE) DRUG SCREEN FOR MEDICAL PURPOSES ONLY.  IF CONFIRMATION IS NEEDED FOR ANY PURPOSE, NOTIFY LAB WITHIN 5 DAYS.  LOWEST DETECTABLE LIMITS FOR URINE DRUG SCREEN Drug Class                     Cutoff  (ng/mL) Amphetamine and metabolites    1000 Barbiturate and metabolites    200 Benzodiazepine                 200 Tricyclics and metabolites     300 Opiates and metabolites        300 Cocaine and metabolites        300 THC  50 Performed at [facility], [address] [place], [place] [address]   CBC     Status: None   Collection Time: 07/16/22  6:59 AM  Result Value Ref Range   WBC 5.3 4.0 - 10.5 K/uL   RBC 4.97 4.22 - 5.81 MIL/uL   Hemoglobin 14.8 13.0 - 17.0 g/dL   HCT 69.7 94.8 - 01.6 %   MCV 90.1 80.0 - 100.0 fL   MCH 29.8 26.0 - 34.0 pg   MCHC 33.0 30.0 - 36.0 g/dL   RDW 55.3 74.8 - 27.0 %   Platelets 220 150 - 400 K/uL   nRBC 0.0 0.0 - 0.2 %    Comment: Performed at [facility], [address], [place], [place] [address]  Comprehensive metabolic panel     Status: Abnormal   Collection Time: 07/16/22  6:59 AM  Result Value Ref Range   Sodium 143 135 - 145 mmol/L   Potassium 4.1 3.5 - 5.1 mmol/L   Chloride 108 98 - 111 mmol/L   CO2 29 22 - 32 mmol/L   Glucose, Bld 86 70 - 99 mg/dL    Comment: Glucose reference range applies only to samples taken after fasting for at least 8 hours.   BUN 16 6 - 20 mg/dL   Creatinine, Ser 4.49 0.61 - 1.24 mg/dL   Calcium 9.0 8.9 - 20.1 mg/dL   Total Protein 7.3 6.5 - 8.1 g/dL   Albumin 3.9 3.5 - 5.0 g/dL   AST 49 (H) 15 - 41 U/L   ALT 39 0 - 44 U/L   Alkaline Phosphatase 48 38 - 126 U/L   Total Bilirubin 0.6 0.3 - 1.2 mg/dL   GFR, Estimated >00 >71 mL/min    Comment: (NOTE) Calculated using the CKD-EPI Creatinine Equation (2021)    Anion gap 6 5 - 15    Comment: Performed at [facility], [address] [place], [place] [address]  Hemoglobin A1c     Status: Abnormal   Collection Time: 07/16/22  6:59 AM  Result Value Ref Range   Hgb A1c MFr Bld 4.7 (L) 4.8 - 5.6 %    Comment: (NOTE) Pre diabetes:          5.7%-6.4%  Diabetes:               >6.4%  Glycemic control for   <7.0% adults with diabetes    Mean Plasma Glucose 88.19 mg/dL    Comment: Performed at [facility], [address] [place], [place] [address]  Lipid panel     Status: Abnormal   Collection Time: 07/16/22  6:59 AM  Result Value Ref Range   Cholesterol 209 (H) 0 - 200 mg/dL   Triglycerides 498 (H) <150 mg/dL   HDL 59 >26 mg/dL   Total CHOL/HDL Ratio 3.5 RATIO   VLDL 77 (H) 0 - 40 mg/dL   LDL Cholesterol 73 0 - 99 mg/dL    Comment:        Total Cholesterol/HDL:CHD Risk Coronary Heart Disease Risk Table                     Men   Women  1/2 Average Risk   3.4   3.3  Average Risk       5.0   4.4  2 X Average Risk   9.6   7.1  3 X Average Risk  23.4   11.0  Use the calculated Patient Ratio above and the CHD Risk Table to determine the patient's CHD Risk.        ATP III CLASSIFICATION (LDL):  <100     mg/dL   Optimal  098-119100-129  mg/dL   Near or Above                    Optimal  130-159  mg/dL   Borderline  [id number]  mg/dL   High  >562>190     mg/dL   Very High Performed at [facility], [address] [place], [place] [address]   TSH     Status: None   Collection Time: 07/16/22  6:59 AM  Result Value Ref Range   TSH 1.953 0.350 - 4.500 uIU/mL    Comment: Performed by a 3rd Generation assay with a functional sensitivity of <=0.01 uIU/mL. Performed at [facility], [address] [place], [place] [address]   Hepatic function panel     Status: Abnormal   Collection Time: 07/16/22  6:59 AM  Result Value Ref Range   Total Protein 7.4 6.5 - 8.1 g/dL   Albumin 4.2 3.5 - 5.0 g/dL   AST 49 (H) 15 - 41 U/L   ALT 39 0 - 44 U/L   Alkaline Phosphatase 49 38 - 126 U/L   Total Bilirubin 0.9 0.3 - 1.2 mg/dL   Bilirubin, Direct 0.1 0.0 - 0.2 mg/dL   Indirect Bilirubin 0.8 0.3 - 0.9 mg/dL    Comment: Performed at [facility], [address] [place], [place] [address]    Blood Alcohol  level:  Lab Results  Component Value Date   ETH 228 (H) 07/01/2022   ETH 77 (H) 07/01/2022    Metabolic Disorder Labs: Lab Results  Component Value Date   HGBA1C 4.7 (L) 07/16/2022   MPG 88.19 07/16/2022   No results found for: "PROLACTIN" Lab Results  Component Value Date   CHOL 209 (H) 07/16/2022   TRIG 385 (H) 07/16/2022   HDL 59 07/16/2022   CHOLHDL 3.5 07/16/2022   VLDL 77 (H) 07/16/2022   LDLCALC 73 07/16/2022    Physical Findings: AIMS:  , ,  ,  ,    CIWA:  CIWA-Ar Total: 11 COWS:     Musculoskeletal: Strength & Muscle Tone: within normal limits Gait & Station: normal Patient leans: N/A  Psychiatric Specialty Exam:  General Appearance: Fairly dressed and groomed, with an average hygiene, appears at stated age   Behavior: Cooperative and pleasant   Psychomotor Activity: Mild psychomotor retardation noted   Mild to moderate hand and leg tremors noted   Eye Contact: Fair yet limited Speech: Normal amount, decreased tone and volume     Mood: Dysphoric, anxious Affect: Congruent, less tremors noted   Thought Process: Linear, goal directed Descriptions of Associations: Intact Thought Content: Hallucinations: Denies AH, VH, does not appear responding to stimuli Delusions: No paranoia or other delusions noted Suicidal Thoughts: Denies SI, intention, plan  Homicidal Thoughts: Denies HI, intention, plan    Alertness/Orientation: Alert and fully oriented but not formally tested   Insight: poor Judgment: poor   Memory: Intact, able to provide detailed history information   Executive Functions  Concentration: Fair Attention Span: Fair Recall: Intact Fund of Knowledge: Fair  Assets  Assets:Communication Skills; Desire for Improvement; Social Support; Physical Health    Physical Exam: Physical Exam ROS Blood pressure (!) 147/101, pulse 77, temperature 97.7 F (36.5 C), temperature  source Oral, resp. rate 18, height  (1.854 m), weight 97.1  kg, SpO2 100 %. Body mass index is 28.23 kg/m.   Treatment Plan Summary:  ASSESSMENT:  Diagnoses / Active Problems: Principal Problem: Alcohol dependence with alcohol-induced mood disorder (HCC) Diagnosis: Principal Problem:   Alcohol dependence with alcohol-induced mood disorder (HCC) Active Problems:   Alcohol withdrawal, with perceptual disturbance (HCC)   Generalized anxiety disorder   PLAN: Safety and Monitoring:             -- Voluntary admission to inpatient psychiatric unit for safety, stabilization and treatment             -- Daily contact with patient to assess and evaluate symptoms and progress in treatment             -- Patient's case to be discussed in multi-disciplinary team meeting             -- Observation Level : q15 minute checks             -- Vital signs:  q12 hours             -- Precautions: suicide, elopement, and assault   2. Medications:              Continue Ativan scheduled taper for alcohol withdrawal              Monitor CIWA with Ativan as needed, adjust the scheduled dosing accordingly continue home Remeron 30 mg at bedtime, prescribed for anxiety, also helping for sleep             Taper down Effexor per patient's request given not effective, will decrease to 37.5 mg daily for 3 days then stop            Continue trazodone 50 mg at bedtime as needed for sleep            Continue Atarax 25 mg 3 times daily as needed for anxiety             Clonidine as needed for elevated blood pressure  Start lisinopril 10 mg daily for hypertension, monitor vitals and adjust accordingly.             Patient reported he tried gabapentin in the past and did not help, he is reluctant to start naltrexone for alcohol dependence and craving at this time, will follow.    The risks/benefits/side-effects/alternatives to this medication were discussed in detail with the patient and time was given for questions. The patient consents to medication trial.               3.  Labs Reviewed: CMP no significant abnormalities noted except elevated AST slightly 49, lipid panel elevated cholesterol 209 and triglycerides 385, CBC within normal level, hemoglobin A1c 4.7, TSH 1.95, UA no UTI noted, alcohol level was not checked prior to this admission but last checked 228 07/01/2022, tox screen positive for benzodiazepine        Lab ordered: None   4. Tobacco Use Disorder             -- Nicorette gum for smoking cessation             -- Smoking cessation encouraged   5. Group and Therapy: -- Encouraged patient to participate in unit milieu and in scheduled group therapies              --Substance Use counseling: Patient was counseled regard to need to  abstain from alcohol use completely after discharge.   At time of initial assessment patient refuses inpatient residential rehab recommended after discharge he only is willing to do outpatient rehab treatment, will follow.   6. Discharge Planning:              -- Social work and case management to assist with discharge planning and identification of hospital follow-up needs prior to discharge             -- Estimated LOS: 5-7 days             -- Discharge Concerns: Need to establish a safety plan; Medication compliance and effectiveness             -- Discharge Goals: Return home with outpatient referrals for mental health follow-up including medication management/psychotherapy   At time of discharge patient need to be scheduled with PCP to follow in regard to abnormal lipid panel and hypertension   The patient is agreeable with the medication plan, as above. We will monitor the patient's response to pharmacologic treatment, and adjust medications as necessary. Patient is encouraged to participate in group therapy while admitted to the psychiatric unit. We will address other chronic and acute stressors, which contributed to the patient's increased alcohol dependence and anxiety, in order to reduce the risk of self-harm at  discharge.     Physician Treatment Plan for Primary Diagnosis: Alcohol dependence with alcohol-induced mood disorder (HCC) Long Term Goal(s): Improvement in symptoms so as ready for discharge   Short Term Goals: Ability to identify changes in lifestyle to reduce recurrence of condition will improve, Ability to verbalize feelings will improve, Ability to disclose and discuss suicidal ideas, Ability to demonstrate self-control will improve, and Ability to identify and develop effective coping behaviors will improve   Total Time Spent in Direct Patient Care:  I personally spent 35 minutes on the unit in direct patient care. The direct patient care time included face-to-face time with the patient, reviewing the patient's chart, communicating with other professionals, and coordinating care. Greater than 50% of this time was spent in counseling or coordinating care with the patient regarding goals of hospitalization, psycho-education, and discharge planning needs.   Jeff Baby Abbott Pao, MD 07/17/2022, 12:20 PM

## 2022-07-17 NOTE — [facility] Group Notes (Signed)
Goals Group 78/2023   Group Focus: affirmation, clarity of thought, and goals/reality orientation Treatment Modality:  Psychoeducation Interventions utilized were assignment, group exercise, and support Purpose: To be able to understand and verbalize the reason for their admission to the hospital. To understand that the medication helps with their chemical imbalance but they also need to work on their choices in life. To be challenged to develop a list of 30 positives about themselves. Also introduce the concept that "feelings" are not reality.  Participation Level:  Active  Participation Quality:  Appropriate  Affect:  Appropriate  Cognitive:  Appropriate  Insight:  Improving  Engagement in Group:  Engaged  Additional Comments:  Rates energy at a 6.5/10. Participated fully in the group,  Dione Housekeeper

## 2022-07-17 NOTE — Progress Notes (Addendum)
Pt presents with anxious mood, affect congruent. [name] states he feels anxious but states he is feeling better overall and would like to start talking about discharge. He states he would like to do outpatient substance abuse. Discussed patient hx and immediate relapse after discharge with outpatient, however patient appears to have poor insight into risk of relapse and hx. Patient denies any SI or HI or A/V hallucinations. CIWA completed and no signs of altered sensorium or elevated CIWA at this time. Pt does appear anxious. Pt did complete self inventory and rated anxiety at 6-7 on scale, 10 being worst 0 being none. Pt denies any depression or hopelessness. Pt states his goal is to work on discharge plan. Encouraged po fluids. Pt is safe, will con't to monitor.

## 2022-07-17 NOTE — Progress Notes (Signed)
Pt reporting worsening withdrawal symptoms. States '' my head is feeling more foggy and I feel more sweaty now. '' Pt with increased tremor. BP remains elevated. CIWA scored and rated at 11 and given prn medication as ordered, please refer to [facility]. Pt also reporting increased anxiety, prn vistaril given. Will con't to monitor.

## 2022-07-18 MED ORDER — NICOTINE 21 MG/24HR TD PT24
21.0000 mg | MEDICATED_PATCH | Freq: Every day | TRANSDERMAL | Status: DC
  Administered 2022-07-18 – 2022-07-20 (×3): 21 mg via TRANSDERMAL
  Filled 2022-07-18 (×7): qty 1

## 2022-07-18 MED ORDER — NALTREXONE HCL 50 MG PO TABS
50.0000 mg | ORAL_TABLET | Freq: Every day | ORAL | Status: DC
  Administered 2022-07-18 – 2022-07-19 (×2): 50 mg via ORAL
  Filled 2022-07-18: qty 1
  Filled 2022-07-18: qty 7
  Filled 2022-07-18: qty 1
  Filled 2022-07-18: qty 7

## 2022-07-18 MED ORDER — LISINOPRIL 20 MG PO TABS
20.0000 mg | ORAL_TABLET | Freq: Every day | ORAL | Status: DC
  Administered 2022-07-19 – 2022-07-21 (×3): 20 mg via ORAL
  Filled 2022-07-18: qty 7
  Filled 2022-07-18 (×2): qty 1
  Filled 2022-07-18 (×2): qty 7

## 2022-07-18 NOTE — Progress Notes (Signed)
[facility] Group Notes:  (Nursing/MHT/Case Management/Adjunct)  Date:  07/18/2022  Time:  2000  Type of Therapy:   wrap up group  Participation Level:  Active  Participation Quality:  Appropriate, Attentive, Sharing, and Supportive  Affect:  Appropriate  Cognitive:  Alert  Insight:  Improving  Engagement in Group:  Engaged  Modes of Intervention:  Clarification, Education, and Support  Summary of Progress/Problems: Positive thinking and positive change were discussed.   Marcille Buffy 07/18/2022, 9:14 PM

## 2022-07-18 NOTE — [facility] Group Notes (Signed)
Adult Psychoeducational Group  Date:  07/18/2022 Time:  1300-1400  Group Topic/Focus: Continuation of the group from Saturday. Looking at the lists that were created and talking about what needs to be done with the homework of 30 positives about themselves.                                     Talking about taking their power back and helping themselves to develop a positive self esteem.      Participation Quality:  Appropriate  Affect:  Appropriate  Cognitive:  Oriented  Insight: Improving  Engagement in Group:  Engaged  Modes of Intervention:  Activity, Discussion, Education, and Support  Additional Comments:  Pt attended the group and finished his project for the group. He wrote 30 positives about himself. Rates his energy at a 7/10  [name] A

## 2022-07-18 NOTE — Plan of Care (Signed)
Nurse discussed anxiety, depression and coping skills with patient.  

## 2022-07-18 NOTE — Progress Notes (Signed)
D:  Patient's self inventory sheet, patient has fair sleep, sleep medicine helpful.  Good appetite, normal energy level, good concentration.  Denied depression and hopeless.  Rated anxiety 6.    Withdrawals, tremors, agitation, sweating.  Denied SI.  Denied physical problems.  Denied physical pain.  Goal is discharge plans.  Plans to work step by step on discharge plans.  Does have discharge plans. A:  Medications administered per MD orders.  Emotional support and encouragement given patient. R:  Denied SI and HI, contracts for safety.  Denied A/V hallucinations.  Safety maintained with 15 minute checks. '

## 2022-07-18 NOTE — Group Note (Signed)
LCSW Group Therapy Note  07/18/2022      Type of Therapy and Topic:  Group Therapy: Gratitude  Participation Level:  Active   Description of Group:   In this group, patients shared and discussed the importance of acknowledging the elements in their lives for which they are grateful and how this can positively impact their mood.  The group discussed how bringing the positive elements of their lives to the forefront of their minds can help with recovery from any illness, physical or mental.  An exercise was done as a group in which a list was made of gratitude items in order to encourage participants to consider other potential positives in their lives.  Therapeutic Goals: Patients will identify one or more item for which they are grateful in each of 6 categories:  people, experience, thing, place, skill, and other. Patients will discuss how it is possible to seek out gratitude in even bad situations. Patients will explore other possible items of gratitude that they could remember.   Summary of Patient Progress:  The patient shared that he is grateful for his 4yo daughter.  He participated throughout group, had a lot of good ideas, and was positive.   Therapeutic Modalities:   Solution-Focused Therapy Activity  Carloyn Jaeger Grossman-Orr, LCSW .

## 2022-07-18 NOTE — Progress Notes (Signed)
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 as a walk-in and gets admitted for evaluation and 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 The patient is currently on Hospital Day 3.   Chart Review from last 24 hours:  The patient's chart was reviewed and nursing notes were reviewed. The patient's case was discussed in multidisciplinary team meeting.  Per chart review CIWA score yesterday at 11:40 AM was 11 and he was given 1 dose of Ativan as needed 1 mg in addition to his a scheduled taper, CIWA score since then has been between 0 and 3, he continues to use Ativan as scheduled taper and compliant with scheduled medications otherwise.  As needed trazodone was used last night, as needed Atarax for anxiety have been used since admission average once daily.  Chart review continues to indicate elevated blood pressure with some as needed use of clonidine.  Information Obtained Today During Patient Interview: The patient was seen and evaluated on the unit. On assessment today the patient reports had a good day yesterday attended groups and is able to discuss coping skills with stressors, able to identify drinking as "bad habit and bad coping skill" yet he continues to have limited insight to severity of his drinking problem and continues to refuse inpatient residential rehab treatment and notes after discharge he is willing to comply with intensive outpatient groups.  I also discussed with him staying with a family member and he agrees to go stay with his mother for few weeks after discharge to ensure structured environment and to avoid relapse on drinking.  He denies depressed mood or anxiety and  continues to deny SI HI or AVH.   He continues to deny any history of suicidal ideation or suicide attempts in the past except for incidence of suicidal ideation when he was under influence of alcohol during previous intoxications. Patient denies any alcohol cravings today and agrees to titrate naltrexone to 50 mg.  Spoke to patient's mother [name] who does confirm patient does not have any suicidal ideation or plan when sober from drinking but does agree with previous incidents of making suicidal comments when intoxicated, she does agree with recommendation for inpatient residential rehab but I did explain to her that it cannot be done under IVC and patient has to be willing to comply.  She noted she will visit the patient later today and try to convince him "I am even willing to pay his bills if he goes for treatment I will beg him to do it", will follow.  In case he continues to refuse inpatient residential rehab and wants to do IOP, patient's mother agree for patient to be staying with her for the next month or 2 to ensure compliance and to help with relapse prevention.  Sleep  Sleep: Improved  Principal Problem: Alcohol dependence with alcohol-induced mood disorder (HCC) Diagnosis: Principal Problem:   Alcohol dependence with alcohol-induced mood disorder (HCC) Active Problems:   Alcohol withdrawal, with perceptual disturbance (HCC)   Generalized anxiety disorder   Past Psychiatric History: Prior Psychiatric diagnoses: GAD Past Psychiatric Hospitalizations: Twice to this facility in 2013 and 2019 for alcohol detox   History of self mutilation: Denies Past  suicide attempts: Denies Past history of HI, violent or aggressive behavior: Denies   Past Psychiatric medications trials: History of treatment with Zoloft for anxiety did not help, currently on Remeron 30 mg at bedtime and Effexor extended release 75 mg daily for the past 3 years with fairly good effect for anxiety but believes  Effexor is not helping, only believes Remeron is helpful without any side effects including any weight gain Tried gabapentin in the past but caused increased anxiety, he tried naltrexone in the past for few days but unsure of effect. History of ECT/TMS: Denies   Outpatient psychiatric Follow up: [facility] urgent care last time a month ago Prior Outpatient Therapy: Behavioral health urgent care last time in April  Past Medical History:  Past Medical History:  Diagnosis Date   Alcohol dependence (HCC)    Anxiety    COVID-19    Hypertension    Suicidal behavior with attempted self-injury [facility](HCC)    History reviewed. No pertinent surgical history. Family History:  Family History  Problem Relation Age of Onset   Lupus Mother    Hypertension Father    Family Psychiatric  History: See H&P Social History: See H&P  Current Medications: Current Facility-Administered Medications  Medication Dose Route Frequency Provider Last Rate Last Admin   acetaminophen (TYLENOL) tablet 650 mg  650 mg Oral Q6H PRN Ntuen, Jesusita Okaina C, FNP       alum & mag hydroxide-simeth (MAALOX/MYLANTA) 200-200-20 MG/5ML suspension 30 mL  30 mL Oral Q4H PRN Ntuen, Jesusita Okaina C, FNP       cloNIDine (CATAPRES) tablet 0.1 mg  0.1 mg Oral Q6H PRN Abbott PaoAttiah, Chandrea Zellman, MD   0.1 mg at 07/18/22 0625   fluticasone (FLONASE) 50 MCG/ACT nasal spray 1 spray  1 spray Each Nare BID PRN Sindy GuadeloupeWilliams, Roy, NP       folic acid (FOLVITE) tablet 1 mg  1 mg Oral Daily Sindy GuadeloupeWilliams, Roy, NP   1 mg at 07/18/22 0825   hydrOXYzine (ATARAX) tablet 25 mg  25 mg Oral TID PRN Cecilie LowersNtuen, Tina C, FNP   25 mg at 07/18/22 1021   lisinopril (ZESTRIL) tablet 10 mg  10 mg Oral Daily Wren Gallaga, MD   10 mg at 07/18/22 0825   loperamide (IMODIUM) capsule 2-4 mg  2-4 mg Oral PRN Abbott PaoAttiah, Lynnsey Barbara, MD       loratadine (CLARITIN) tablet 10 mg  10 mg Oral QPM Shaletta Hinostroza, MD   10 mg at 07/17/22 1612   LORazepam (ATIVAN) tablet 0.5 mg  0.5 mg Oral TID Abbott PaoAttiah, Sharry Beining, MD        LORazepam (ATIVAN) tablet 1 mg  1 mg Oral Q6H PRN Abbott PaoAttiah, Jebadiah Imperato, MD   1 mg at 07/17/22 1142   magnesium hydroxide (MILK OF MAGNESIA) suspension 30 mL  30 mL Oral Daily PRN Ntuen, Jesusita Okaina C, FNP       mirtazapine (REMERON) tablet 30 mg  30 mg Oral QHS Sindy GuadeloupeWilliams, Roy, NP   30 mg at 07/17/22 2118   multivitamin with minerals tablet 1 tablet  1 tablet Oral Daily Abbott PaoAttiah, Karlin Binion, MD   1 tablet at 07/18/22 0825   naltrexone (DEPADE) tablet 25 mg  25 mg Oral QHS Ireoluwa Grant, MD   25 mg at 07/17/22 2118   nicotine polacrilex (NICORETTE) gum 2 mg  2 mg Oral PRN Sindy GuadeloupeWilliams, Roy, NP   2 mg at 07/18/22 0828   ondansetron (ZOFRAN-ODT) disintegrating tablet 4 mg  4 mg Oral Q6H PRN Sarita BottomAttiah, Alexiana Laverdure, MD  thiamine (VITAMIN B1) tablet 100 mg  100 mg Oral Daily Sindy Guadeloupe, NP   100 mg at 07/18/22 0826   traZODone (DESYREL) tablet 50 mg  50 mg Oral QHS PRN Cecilie Lowers, FNP   50 mg at 07/17/22 2118   venlafaxine XR (EFFEXOR-XR) 24 hr capsule 37.5 mg  37.5 mg Oral Q breakfast Abbott Pao, Sulamita Lafountain, MD   37.5 mg at 07/18/22 0630    Lab Results:  No results found for this or any previous visit (from the past 48 hour(s)).   Blood Alcohol level:  Lab Results  Component Value Date   ETH 228 (H) 07/01/2022   ETH 77 (H) 07/01/2022    Metabolic Disorder Labs: Lab Results  Component Value Date   HGBA1C 4.7 (L) 07/16/2022   MPG 88.19 07/16/2022   No results found for: "PROLACTIN" Lab Results  Component Value Date   CHOL 209 (H) 07/16/2022   TRIG 385 (H) 07/16/2022   HDL 59 07/16/2022   CHOLHDL 3.5 07/16/2022   VLDL 77 (H) 07/16/2022   LDLCALC 73 07/16/2022    Physical Findings: AIMS:  , ,  ,  ,    CIWA:  CIWA-Ar Total: 11 8/12 11:40 AM COWS:     Musculoskeletal: Strength & Muscle Tone: within normal limits Gait & Station: normal Patient leans: N/A  Psychiatric Specialty Exam:  General Appearance: Fairly dressed and groomed, with an average hygiene, appears at stated age   Behavior: Cooperative and  pleasant   Psychomotor Activity: Mild psychomotor retardation noted   Mild to moderate hand and leg tremors noted   Eye Contact: Fair yet limited Speech: Normal amount, decreased tone and volume     Mood: Dysphoric, anxious Affect: Congruent, less tremors noted   Thought Process: Linear, goal directed Descriptions of Associations: Intact Thought Content: Hallucinations: Denies AH, VH, does not appear responding to stimuli Delusions: No paranoia or other delusions noted Suicidal Thoughts: Denies SI, intention, plan  Homicidal Thoughts: Denies HI, intention, plan    Alertness/Orientation: Alert and fully oriented but not formally tested   Insight: poor Judgment: poor   Memory: Intact, able to provide detailed history information   Executive Functions  Concentration: Fair Attention Span: Fair Recall: Intact Fund of Knowledge: Fair  Assets  Assets:Communication Skills; Desire for Improvement; Social Support; Physical Health    Physical Exam: Physical Exam Review of Systems  Constitutional: Negative.   HENT: Negative.    Eyes: Negative.   Respiratory: Negative.    Cardiovascular: Negative.   Gastrointestinal: Negative.   Genitourinary: Negative.   Musculoskeletal: Negative.   Skin: Negative.   Neurological: Negative.    Blood pressure (!) 158/104, pulse 81, temperature 97.8 F (36.6 C), temperature source Oral, resp. rate 16, height 6\' 1"  (1.854 m), weight 97.1 kg, SpO2 100 %. Body mass index is 28.23 kg/m.   Treatment Plan Summary:  ASSESSMENT:  Diagnoses / Active Problems: Principal Problem: Alcohol dependence with alcohol-induced mood disorder (HCC) Diagnosis: Principal Problem:   Alcohol dependence with alcohol-induced mood disorder (HCC) Active Problems:   Alcohol withdrawal, with perceptual disturbance (HCC)   Generalized anxiety disorder   PLAN: Safety and Monitoring:             -- Voluntary admission to inpatient psychiatric unit for  safety, stabilization and treatment             -- Daily contact with patient to assess and evaluate symptoms and progress in treatment             --  Patient's case to be discussed in multi-disciplinary team meeting             -- Observation Level : q15 minute checks             -- Vital signs:  q12 hours             -- Precautions: suicide, elopement, and assault   2. Medications:              Continue Ativan scheduled taper for alcohol withdrawal              Monitor CIWA with Ativan as needed, adjust the scheduled dosing accordingly continue home Remeron 30 mg at bedtime, prescribed for anxiety, also helping for sleep             Continue to Taper down Effexor per patient's request given not effective, on 37.5 mg daily for 3 days started 8/13 then stop            Continue trazodone 50 mg at bedtime as needed for sleep            Continue Atarax 25 mg 3 times daily as needed for anxiety             Clonidine as needed for elevated blood pressure  Titrate lisinopril from 10-20 mg daily for hypertension, monitor vitals and adjust accordingly.             Patient reported he tried gabapentin in the past and did not help, he is reluctant to start naltrexone for alcohol dependence and craving at this time, will follow.    The risks/benefits/side-effects/alternatives to this medication were discussed in detail with the patient and time was given for questions. The patient consents to medication trial.               3. Labs Reviewed: CMP no significant abnormalities noted except elevated AST slightly 49, lipid panel elevated cholesterol 209 and triglycerides 385, CBC within normal level, hemoglobin A1c 4.7, TSH 1.95, UA no UTI noted, alcohol level was not checked prior to this admission but last checked 228 07/01/2022, tox screen positive for benzodiazepine        Lab ordered: None   4. Tobacco Use Disorder             -- Nicorette gum for smoking cessation             -- Smoking cessation  encouraged   5. Group and Therapy: -- Encouraged patient to participate in unit milieu and in scheduled group therapies              --Substance Use counseling: Patient was counseled regard to need to abstain from alcohol use completely after discharge.   At time of initial assessment patient refuses inpatient residential rehab recommended after discharge he only is willing to do outpatient rehab treatment, will follow.   6. Discharge Planning:              -- Social work and case management to assist with discharge planning and identification of hospital follow-up needs prior to discharge             -- Estimated LOS: 5-7 days             -- Discharge Concerns: Need to establish a safety plan; Medication compliance and effectiveness             -- Discharge Goals: Return home with outpatient referrals for mental health follow-up  including medication management/psychotherapy   At time of discharge patient need to be scheduled with PCP to follow in regard to abnormal lipid panel and hypertension   The patient is agreeable with the medication plan, as above. We will monitor the patient's response to pharmacologic treatment, and adjust medications as necessary. Patient is encouraged to participate in group therapy while admitted to the psychiatric unit. We will address other chronic and acute stressors, which contributed to the patient's increased alcohol dependence and anxiety, in order to reduce the risk of self-harm at discharge.     Physician Treatment Plan for Primary Diagnosis: Alcohol dependence with alcohol-induced mood disorder (HCC) Long Term Goal(s): Improvement in symptoms so as ready for discharge   Short Term Goals: Ability to identify changes in lifestyle to reduce recurrence of condition will improve, Ability to verbalize feelings will improve, Ability to disclose and discuss suicidal ideas, Ability to demonstrate self-control will improve, and Ability to identify and develop  effective coping behaviors will improve   Total Time Spent in Direct Patient Care:  I personally spent 35 minutes on the unit in direct patient care. The direct patient care time included face-to-face time with the patient, reviewing the patient's chart, communicating with other professionals, and coordinating care. Greater than 50% of this time was spent in counseling or coordinating care with the patient regarding goals of hospitalization, psycho-education, and discharge planning needs.   Kayslee Furey Abbott Pao, MD 07/18/2022, 11:26 AM

## 2022-07-18 NOTE — [facility] Group Notes (Signed)
Adult Psychoeducational Group Note  Date:  07/18/2022 Time:  10:50 AM  Group Topic/Focus:  Goals Group:   The focus of this group is to help patients establish daily goals to achieve during treatment and discuss how the patient can incorporate goal setting into their daily lives to aide in recovery.  Participation Level:  Active  Participation Quality:  Attentive  Affect:  Appropriate  Cognitive:  Appropriate  Insight: Appropriate  Engagement in Group:  Engaged  Modes of Intervention:  Discussion  Additional Comments:  Patient attended goals group and was attentive the duration of it. Patient's goal was get a discharge plan.   Vyla Pint T Albina Gosney 07/18/2022, 10:50 AM

## 2022-07-19 DIAGNOSIS — F1024 Alcohol dependence with alcohol-induced mood disorder: Principal | ICD-10-CM

## 2022-07-19 MED ORDER — LORAZEPAM 0.5 MG PO TABS
0.5000 mg | ORAL_TABLET | Freq: Two times a day (BID) | ORAL | Status: DC
Start: 2022-07-19 — End: 2022-07-20
  Administered 2022-07-19 – 2022-07-20 (×2): 0.5 mg via ORAL
  Filled 2022-07-19 (×2): qty 1

## 2022-07-19 NOTE — [facility] Suicide Risk Assessment (Signed)
[facility] INPATIENT:  Family/Significant Other Suicide Prevention Education  Suicide Prevention Education:  Education Completed; Amy Hannay, mom, [phone]  (name of family member/significant other) has been identified by the patient as the family member/significant other with whom the patient will be residing, and identified as the person(s) who will aid the patient in the event of a mental health crisis (suicidal ideations/suicide attempt).  With written consent from the patient, the family member/significant other has been provided the following suicide prevention education, prior to the and/or following the discharge of the patient.  Mother reports that she would be okay with having him stay with her for a couple of weeks as he further stabilizes however he does have a place of his own.  She is worried that he won't follow through with CDIOP program and mentioned that he has mentioned feeling "bullied" into the program.  She reports that he has been going through alcohol addiction for the past 11 years. No access to guns/weapons.  They have guns in their house but they are locked and  only father has the key.  She feels that he also has anger issues and mental health that he needs to take care of as well.   The suicide prevention education provided includes the following: Suicide risk factors Suicide prevention and interventions National Suicide Hotline telephone number [facility] assessment telephone number [facility] Emergency Assistance 911 [facility] and/or Residential Mobile Crisis Unit telephone number  Request made of family/significant other to: Remove weapons (e.g., guns, rifles, knives), all items previously/currently identified as safety concern.   Remove drugs/medications (over-the-counter, prescriptions, illicit drugs), all items previously/currently identified as a safety concern.  The family member/significant other verbalizes understanding of the suicide  prevention education information provided.  The family member/significant other agrees to remove the items of safety concern listed above.  Calista Crain E Dorita Rowlands 07/19/2022, 4:12 PM

## 2022-07-19 NOTE — Group Note (Signed)
Recreation Therapy Group Note   Group Topic:Stress Management  Group Date: 07/19/2022 Start Time: 0930 End Time: 0950 Facilitators: Caroll Rancher, [place] Location: [address] Dayroom   Goal Area(s) Addresses:  Patient will identify positive stress management techniques. Patient will identify benefits of using stress management post d/c.  Group Description:  Meditation.  LRT played a mountain meditation.  This meditation focused on taking on the characteristics of the mountain into meditation and everyday life.  The meditation centered on how mountains go through each season, they are able to take on those changes and remain steady through it.  The meditation brings that same steadiness into everyday living.    Affect/Mood: Appropriate   Participation Level: Engaged   Participation Quality: Independent   Behavior: Appropriate   Speech/Thought Process: Focused   Insight: Good   Judgement: Good   Modes of Intervention: Meditation   Patient Response to Interventions:  Engaged   Education Outcome:  Acknowledges education and In group clarification offered    Clinical Observations/Individualized Feedback: Pt attended and participated in activity.    Plan: Continue to engage patient in RT group sessions 2-3x/week.   Caroll Rancher, LRT,CTRS 07/19/2022 10:55 AM

## 2022-07-19 NOTE — Plan of Care (Signed)
Nurse discussed anxiety, depression and coping skills with patient.  

## 2022-07-19 NOTE — Progress Notes (Signed)
Pt said that one of his stressors related to drinking is his custody battle for his daughter and his poor relationship with his wife. Upon discharge, he plans on staying with his parents for a week. He is interested in intensive outpatient treatment and would like to get set up with a psychiatrist and counselor. Reports that the longest he has been sober was 5-6 days. He plans on attending AA meetings upon discharge and following up with his PCP for his HTN. Pt denies SI/HI and AVH. Active listening, reassurance, and support provided. Q 15 min safety checks continue. Pt's safety has been maintained.   07/18/22 2105  Psych Admission Type (Psych Patients Only)  Admission Status Voluntary  Psychosocial Assessment  Patient Complaints Anxiety;Depression;Sadness;Worrying  Eye Contact Fair  Facial Expression Anxious  Affect Anxious;Appropriate to circumstance;Depressed  Speech Logical/coherent  Interaction Assertive  Motor Activity Fidgety  Appearance/Hygiene Unremarkable  Behavior Characteristics Cooperative;Appropriate to situation;Anxious  Mood Depressed;Anxious  Thought Process  Coherency WDL  Content WDL  Delusions None reported or observed  Perception WDL  Hallucination None reported or observed  Judgment WDL  Confusion None  Danger to Self  Current suicidal ideation? Denies  Agreement Not to Harm Self Yes  Description of Agreement verbally contracts for safety  Danger to Others  Danger to Others None reported or observed

## 2022-07-19 NOTE — [facility] Group Notes (Signed)
[facility] Group Notes:  (Nursing/MHT/Case Management/Adjunct)  Date:  07/19/2022  Time:  9:31 AM  Type of Therapy:  Group Therapy  Participation Level:  Active  Participation Quality:  Appropriate  Affect:  Appropriate  Cognitive:  Appropriate  Insight:  Appropriate  Engagement in Group:  Engaged  Modes of Intervention:  Discussion  Summary of Progress/Problems:  Patient attended and participated in a goals/ orientation group today. Patient's goal for today is to follow up with his appointments and referrals for discharge.   [name] 07/19/2022, 9:31 AM

## 2022-07-19 NOTE — Progress Notes (Signed)
Spiritual care group on grief and loss facilitated by chaplain Burnis Kingfisher MDiv, [facility]  Group Goal:  Support / Education around grief and loss Members engage in facilitated group support and psycho-social education.  Group Description:  Following introductions and group rules, group members engaged in facilitated group dialog and support around topic of loss, with particular support around experiences of loss in their lives. Group Identified types of loss (relationships / self / things) and identified patterns, circumstances, and changes that precipitate losses. Reflected on thoughts / feelings around loss, normalized grief responses, and recognized variety in grief experience.   Group noted Worden's four tasks of grief in discussion.  Group drew on Adlerian / Rogerian, narrative, MI, Patient Progress:  Glade was present through group introductions.  Left group room and did not return.

## 2022-07-19 NOTE — Group Note (Signed)
LCSW Group Therapy Note   Group Date: 07/19/2022 Start Time: 1300 End Time: 1400   Type of Therapy and Topic:  Group Therapy: Anger Cues and Responses  Participation Level:  Active   Description of Group:   In this group, patients learned how to recognize the physical, cognitive, emotional, and behavioral responses they have to anger-provoking situations.  They identified a recent time they became angry and how they reacted.  They analyzed how their reaction was possibly beneficial and how it was possibly unhelpful.  The group discussed a variety of healthier coping skills that could help with such a situation in the future.  Focus was placed on how helpful it is to recognize the underlying emotions to our anger, because working on those can lead to a more permanent solution as well as our ability to focus on the important rather than the urgent.  Therapeutic Goals: Patients will remember their last incident of anger and how they felt emotionally and physically, what their thoughts were at the time, and how they behaved. Patients will identify how their behavior at that time worked for them, as well as how it worked against them. Patients will explore possible new behaviors to use in future anger situations. Patients will learn that anger itself is normal and cannot be eliminated, and that healthier reactions can assist with resolving conflict rather than worsening situations.  Summary of Patient Progress:  Pt was present/active throughout the session and proved open to feedback from CSW and peers. Patient demonstrated good insight into the subject matter, was respectful of peers, and was present and engaged throughout the entire session.  Therapeutic Modalities:   Cognitive Behavioral Therapy   Kathrynn Humble 07/19/2022  6:35 PM

## 2022-07-19 NOTE — Progress Notes (Signed)
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 The patient is currently on Hospital Day 4.   Chart Review from last 24 hours:  The patient's chart was reviewed and nursing notes were reviewed. The patient's case was discussed in multidisciplinary team meeting.  Per chart review CIWA score 6 earlier this morning, no as needed Ativan used for breakthrough high CIWA score, patient received Ativan 0.5 mg 3 times daily yesterday and I will start him on 0.5 mg twice daily today to complete Ativan taper. Chart review indicates blood pressure seems to be improving since lisinopril was started.  Information Obtained Today During Patient Interview: The patient was seen and evaluated on the unit. On assessment today reports interrupted sleep last night and nightmares but with further questioning it seems to be related to him not taking off NicoDerm patch at bedtime, discussed with patient importance to take off NicoDerm patch prior to sleep, will follow.  He reports fair appetite, he continues to report fair mood denies depressed mood or anxiety continues to deny SI HI or AVH or feeling hopeless or helpless, he denies craving to alcohol.  Mother visited yesterday and encouraged patient going to inpatient residential rehab but this morning patient continues to refuse inpatient residential rehab and reports plan to go to IOP after discharge and to stay with his mother for few weeks for better structural environment and to decrease risk of relapse.  He denies side effect to current medication regimen. Discussed with  patient in regard to his new hypertension diagnosis and need to comply with lisinopril after discharge and need to see a primary care provider for follow-up, he agrees.  Sleep  Sleep: Improved except for nightmares last night probably related to NicoDerm patch.  Principal Problem: Alcohol dependence with alcohol-induced mood disorder (HCC) Diagnosis: Principal Problem:   Alcohol dependence with alcohol-induced mood disorder (HCC) Active Problems:   Alcohol withdrawal, with perceptual disturbance (HCC)   Generalized anxiety disorder   Past Psychiatric History: Prior Psychiatric diagnoses: GAD Past Psychiatric Hospitalizations: Twice to this facility in 2013 and 2019 for alcohol detox   History of self mutilation: Denies Past suicide attempts: Denies Past history of HI, violent or aggressive behavior: Denies   Past Psychiatric medications trials: History of treatment with Zoloft for anxiety did not help, currently on Remeron 30 mg at bedtime and Effexor extended release 75 mg daily for the past 3 years with fairly good effect for anxiety but believes Effexor is not helping, only believes Remeron is helpful without any side effects including any weight gain Tried gabapentin in the past but caused increased anxiety, he tried naltrexone in the past for few days but unsure of effect. History of ECT/TMS: Denies   Outpatient psychiatric Follow up: [facility] care last time a month ago Prior Outpatient Therapy: [facility] health urgent care last time in April  Past Medical History:  Past Medical History:  Diagnosis Date   Alcohol dependence (HCC)    Anxiety    COVID-19    Hypertension    Suicidal behavior with attempted self-injury [facility])    History reviewed. No  pertinent surgical history. Family History:  Family History  Problem Relation Age of Onset   Lupus Mother    Hypertension Father    Family Psychiatric  History: See H&P Social History: See H&P  Current  Medications: Current Facility-Administered Medications  Medication Dose Route Frequency Provider Last Rate Last Admin   acetaminophen (TYLENOL) tablet 650 mg  650 mg Oral Q6H PRN Ntuen, Jesusita Oka, FNP       alum & mag hydroxide-simeth (MAALOX/MYLANTA) 200-200-20 MG/5ML suspension 30 mL  30 mL Oral Q4H PRN Ntuen, Jesusita Oka, FNP       cloNIDine (CATAPRES) tablet 0.1 mg  0.1 mg Oral Q6H PRN Abbott Pao, Raelee Rossmann, MD   0.1 mg at 07/18/22 0625   fluticasone (FLONASE) 50 MCG/ACT nasal spray 1 spray  1 spray Each Nare BID PRN Sindy Guadeloupe, NP       folic acid (FOLVITE) tablet 1 mg  1 mg Oral Daily Sindy Guadeloupe, NP   1 mg at 07/19/22 7124   hydrOXYzine (ATARAX) tablet 25 mg  25 mg Oral TID PRN Cecilie Lowers, FNP   25 mg at 07/19/22 0650   lisinopril (ZESTRIL) tablet 20 mg  20 mg Oral Daily Hisashi Amadon, MD   20 mg at 07/19/22 0805   loratadine (CLARITIN) tablet 10 mg  10 mg Oral QPM Dioselina Brumbaugh, MD   10 mg at 07/18/22 1716   magnesium hydroxide (MILK OF MAGNESIA) suspension 30 mL  30 mL Oral Daily PRN Ntuen, Jesusita Oka, FNP       mirtazapine (REMERON) tablet 30 mg  30 mg Oral QHS Sindy Guadeloupe, NP   30 mg at 07/18/22 2105   multivitamin with minerals tablet 1 tablet  1 tablet Oral Daily Abbott Pao, Kent Braunschweig, MD   1 tablet at 07/19/22 0807   naltrexone (DEPADE) tablet 50 mg  50 mg Oral QHS Kieren Ricci, MD   50 mg at 07/18/22 2105   nicotine (NICODERM CQ - dosed in mg/24 hours) patch 21 mg  21 mg Transdermal Daily Tanish Sinkler, MD   21 mg at 07/19/22 0708   thiamine (VITAMIN B1) tablet 100 mg  100 mg Oral Daily Sindy Guadeloupe, NP   100 mg at 07/19/22 0810   traZODone (DESYREL) tablet 50 mg  50 mg Oral QHS PRN Ntuen, Jesusita Oka, FNP   50 mg at 07/18/22 2105   venlafaxine XR (EFFEXOR-XR) 24 hr capsule 37.5 mg  37.5 mg Oral Q breakfast Abbott Pao, Ahsley Attwood, MD   37.5 mg at 07/19/22 5809    Lab Results:  No results found for this or any previous visit (from the past 48 hour(s)).   Blood Alcohol level:  Lab Results  Component Value  Date   ETH 228 (H) 07/01/2022   ETH 77 (H) 07/01/2022    Metabolic Disorder Labs: Lab Results  Component Value Date   HGBA1C 4.7 (L) 07/16/2022   MPG 88.19 07/16/2022   No results found for: "PROLACTIN" Lab Results  Component Value Date   CHOL 209 (H) 07/16/2022   TRIG 385 (H) 07/16/2022   HDL 59 07/16/2022   CHOLHDL 3.5 07/16/2022   VLDL 77 (H) 07/16/2022   LDLCALC 73 07/16/2022    Physical Findings: AIMS: Facial and Oral Movements Muscles of Facial Expression: None, normal Lips and Perioral Area: None, normal Jaw: None, normal Tongue: None, normal,Extremity Movements Upper (arms, wrists, hands, fingers): None, normal Lower (legs, knees, ankles, toes): None, normal, Trunk Movements Neck, shoulders, hips: None, normal, Overall Severity Severity of abnormal movements (  highest score from questions above): None, normal Incapacitation due to abnormal movements: None, normal Patient's awareness of abnormal movements (rate only patient's report): No Awareness, Dental Status Current problems with teeth and/or dentures?: No Does patient usually wear dentures?: No  CIWA:  CIWA-Ar Total: 6 8/12 11:40 AM COWS:     Musculoskeletal: Strength & Muscle Tone: within normal limits Gait & Station: normal Patient leans: N/A  Psychiatric Specialty Exam:  General Appearance: Fairly dressed and groomed, with an average hygiene, appears at stated age   Behavior: Cooperative and pleasant   Psychomotor Activity: Mild psychomotor retardation noted   Mild to moderate hand and leg tremors noted   Eye Contact: Fair yet limited Speech: Normal amount, decreased tone and volume     Mood: Dysphoric, anxious Affect: Congruent, less tremors noted   Thought Process: Linear, goal directed Descriptions of Associations: Intact Thought Content: Hallucinations: Denies AH, VH, does not appear responding to stimuli Delusions: No paranoia or other delusions noted Suicidal Thoughts: Denies SI,  intention, plan  Homicidal Thoughts: Denies HI, intention, plan    Alertness/Orientation: Alert and fully oriented but not formally tested   Insight: poor Judgment: poor   Memory: Intact, able to provide detailed history information   Executive Functions  Concentration: Fair Attention Span: Fair Recall: Intact Fund of Knowledge: Fair  Assets  Assets:Communication Skills; Desire for Improvement; Social Support; Physical Health    Physical Exam: Physical Exam Review of Systems  Constitutional: Negative.   HENT: Negative.    Eyes: Negative.   Respiratory: Negative.    Cardiovascular: Negative.   Gastrointestinal: Negative.   Genitourinary: Negative.   Musculoskeletal: Negative.   Skin: Negative.   Neurological: Negative.    Blood pressure (!) 126/92, pulse 88, temperature 98.5 F (36.9 C), temperature source Oral, resp. rate 16, height 6\' 1"  (1.854 m), weight 97.1 kg, SpO2 100 %. Body mass index is 28.23 kg/m.   Treatment Plan Summary:  ASSESSMENT:  Diagnoses / Active Problems: Principal Problem: Alcohol dependence with alcohol-induced mood disorder (HCC) Diagnosis: Principal Problem:   Alcohol dependence with alcohol-induced mood disorder (HCC) Active Problems:   Alcohol withdrawal, with perceptual disturbance (HCC)   Generalized anxiety disorder   PLAN: Safety and Monitoring:             -- Voluntary admission to inpatient psychiatric unit for safety, stabilization and treatment             -- Daily contact with patient to assess and evaluate symptoms and progress in treatment             -- Patient's case to be discussed in multi-disciplinary team meeting             -- Observation Level : q15 minute checks             -- Vital signs:  q12 hours             -- Precautions: suicide, elopement, and assault   2. Medications:              Continue Ativan scheduled taper for alcohol withdrawal, will give Ativan 0.5 mg twice daily today to complete his  taper.             Monitor CIWA with Ativan as needed, adjust the scheduled dosing accordingly continue home Remeron 30 mg at bedtime, prescribed for anxiety, also helping for sleep             Continue to Taper down Effexor per patient's  request given not effective, on 37.5 mg daily for 3 days started 8/13 then stop            Continue trazodone 50 mg at bedtime as needed for sleep            Continue Atarax 25 mg 3 times daily as needed for anxiety             Clonidine as needed for elevated blood pressure  Continue lisinopril 20 mg daily for hypertension, monitor vitals and adjust accordingly.             Patient reported he tried gabapentin in the past and did not help, he is reluctant to start naltrexone for alcohol dependence and craving at this time, will follow.    The risks/benefits/side-effects/alternatives to this medication were discussed in detail with the patient and time was given for questions. The patient consents to medication trial.               3. Labs Reviewed: CMP no significant abnormalities noted except elevated AST slightly 49, lipid panel elevated cholesterol 209 and triglycerides 385, CBC within normal level, hemoglobin A1c 4.7, TSH 1.95, UA no UTI noted, alcohol level was not checked prior to this admission but last checked 228 07/01/2022, tox screen positive for benzodiazepine        Lab ordered: None   4. Tobacco Use Disorder             -- Nicorette gum for smoking cessation             -- Smoking cessation encouraged   5. Group and Therapy: -- Encouraged patient to participate in unit milieu and in scheduled group therapies              --Substance Use counseling: Patient was counseled regard to need to abstain from alcohol use completely after discharge.   At time of initial assessment patient refuses inpatient residential rehab recommended after discharge he only is willing to do outpatient rehab treatment, will follow.   6. Discharge Planning:               -- Social work and case management to assist with discharge planning and identification of hospital follow-up needs prior to discharge             -- Estimated LOS: 5-7 days             -- Discharge Concerns: Need to establish a safety plan; Medication compliance and effectiveness             -- Discharge Goals: Return home with outpatient referrals for mental health follow-up including medication management/psychotherapy   At time of discharge patient need to be scheduled with PCP to follow in regard to abnormal lipid panel and hypertension   The patient is agreeable with the medication plan, as above. We will monitor the patient's response to pharmacologic treatment, and adjust medications as necessary. Patient is encouraged to participate in group therapy while admitted to the psychiatric unit. We will address other chronic and acute stressors, which contributed to the patient's increased alcohol dependence and anxiety, in order to reduce the risk of self-harm at discharge.     Physician Treatment Plan for Primary Diagnosis: Alcohol dependence with alcohol-induced mood disorder (HCC) Long Term Goal(s): Improvement in symptoms so as ready for discharge   Short Term Goals: Ability to identify changes in lifestyle to reduce recurrence of condition will improve, Ability to verbalize feelings will improve,  Ability to disclose and discuss suicidal ideas, Ability to demonstrate self-control will improve, and Ability to identify and develop effective coping behaviors will improve   Total Time Spent in Direct Patient Care:  I personally spent 35 minutes on the unit in direct patient care. The direct patient care time included face-to-face time with the patient, reviewing the patient's chart, communicating with other professionals, and coordinating care. Greater than 50% of this time was spent in counseling or coordinating care with the patient regarding goals of hospitalization, psycho-education, and  discharge planning needs.   Melysa Schroyer Abbott PaoAttiah, MD 07/19/2022, 12:15 PM

## 2022-07-19 NOTE — Progress Notes (Signed)
D:  Patient's self inventory, patient has fair sleep, sleep medication helpful.  Fair appetite, normal energy level, good concentration.  Denied depression, hopeless, rated anxiety 6.  Withdrawals, tremors, chilling, sweats.  Denied SI.  Denied physical problems.  Denied physical pain.  Goal is appointments with psychiatrist and counselor.  Talk with SW.  Forgot to take nicotine patch off last night. Not sure of discharge plans yet. A:  Medications admimistered per MD orders.  Emotional support and encouragement given patient. R:  Denied SI and HI, contracts for safety.  Denied A/V hallucinations.  Safety maintained with 15 minute checks.

## 2022-07-20 MED ORDER — LORAZEPAM 0.5 MG PO TABS
0.5000 mg | ORAL_TABLET | Freq: Two times a day (BID) | ORAL | Status: AC
  Administered 2022-07-20: 0.5 mg via ORAL
  Filled 2022-07-20: qty 1

## 2022-07-20 NOTE — Group Note (Addendum)
Recreation Therapy Group Note   Group Topic:Animal Assisted Therapy   Group Date: 07/20/2022 Start Time: 1430 End Time: 1500 Facilitators: Geraldine Tesar, [name], LRT Location: [address] Dayroom  Animal-Assisted Activity (AAA) Program Checklist/Progress Notes Patient Eligibility Criteria Checklist & Daily Group note for Rec Tx Intervention   AAA/T Program Assumption of Risk Form signed by Patient/ or Parent Legal Guardian YES  Patient is free of allergies or severe asthma  YES  Patient reports no fear of animals YES  Patient reports no history of cruelty to animals YES  Patient understands their participation is voluntary YES  Patient washes hands before animal contact YES  Patient washes hands after animal contact YES   Group Description: Patients provided opportunity to interact with trained and credentialed [employer] Therapy dog and the community volunteer/dog handler. Patients practiced appropriate animal interaction and were educated on dog safety outside of the hospital in common community settings. Patients were encouraged to socialize with one another and share about their experiences with animals and pets. Patients participated with turn taking for dog interactions with structure imposed as needed based on number of participants and quality of spontaneous participation delivered.  Goal Area(s) Addresses:  Patient will demonstrate appropriate social skills during group session.  Patient will demonstrate ability to follow instructions during group session.  Patient will identify if a reduction in stress level occurs as a result of participation in animal assisted therapy session.    Education: Charity fundraiser, Health visitor, Communication & Social Skills   Affect/Mood: Congruent and Euthymic to Happy   Participation Level: Engaged   Participation Quality: Independent   Behavior: Appropriate, Attentive , Cooperative, and Interactive    Modes of  Intervention: Activity, Teaching laboratory technician, and Socialization   Patient Response to Interventions:  Interested  and Receptive   Education Outcome:  Acknowledges education   Clinical Observations/Individualized Feedback: [name] appropriately pet the visiting therapy dog, [name] throughout group and asked relevant questions to Teaching laboratory technician. Pt shared that they have a cat named [name] whom they have a tattoo for. Pt elaborated that they have had this pet for 6 years.    [name] [name], LRT, CTRS 07/20/2022 4:35 PM

## 2022-07-20 NOTE — Plan of Care (Signed)
  Problem: Education: Goal: Knowledge of [facility] Education information/materials will improve Outcome: Progressing Goal: Emotional status will improve Outcome: Progressing Goal: Mental status will improve Outcome: Progressing Goal: Verbalization of understanding the information provided will improve Outcome: Progressing   Problem: Activity: Goal: Interest or engagement in activities will improve Outcome: Progressing Goal: Sleeping patterns will improve Outcome: Progressing   Problem: Coping: Goal: Ability to verbalize frustrations and anger appropriately will improve Outcome: Progressing Goal: Ability to demonstrate self-control will improve Outcome: Progressing   Problem: Health Behavior/Discharge Planning: Goal: Identification of resources available to assist in meeting health care needs will improve Outcome: Progressing Goal: Compliance with treatment plan for underlying cause of condition will improve Outcome: Progressing   Problem: Physical Regulation: Goal: Ability to maintain clinical measurements within normal limits will improve Outcome: Progressing   Problem: Safety: Goal: Periods of time without injury will increase Outcome: Progressing   Problem: Education: Goal: Knowledge of disease or condition will improve Outcome: Progressing Goal: Understanding of discharge needs will improve Outcome: Progressing   Problem: Health Behavior/Discharge Planning: Goal: Ability to identify changes in lifestyle to reduce recurrence of condition will improve Outcome: Progressing Goal: Identification of resources available to assist in meeting health care needs will improve Outcome: Progressing   Problem: Physical Regulation: Goal: Complications related to the disease process, condition or treatment will be avoided or minimized Outcome: Progressing   Problem: Safety: Goal: Ability to remain free from injury will improve Outcome: Progressing

## 2022-07-20 NOTE — Group Note (Signed)
Date:  07/20/2022 Time:  10:25 AM  Group Topic/Focus:  Orientation:   The focus of this group is to educate the patient on the purpose and policies of crisis stabilization and provide a format to answer questions about their admission.  The group details unit policies and expectations of patients while admitted.    Participation Level:  Active  Participation Quality:  Appropriate  Affect:  Appropriate  Cognitive:  Appropriate  Insight: Appropriate  Engagement in Group:  Engaged  Modes of Intervention:  Discussion  Additional Comments:     Reymundo Poll 07/20/2022, 10:25 AM

## 2022-07-20 NOTE — Progress Notes (Signed)
Adult Psychoeducational Group Note  Date:  07/20/2022 Time:  8:49 PM  Group Topic/Focus:  Wrap-Up Group:   The focus of this group is to help patients review their daily goal of treatment and discuss progress on daily workbooks.  Participation Level:  Active  Participation Quality:  Appropriate  Affect:  Appropriate  Cognitive:  Appropriate  Insight: Appropriate  Engagement in Group:  Engaged  Modes of Intervention:  Discussion  Additional Comments:  Patient said his day was 8. His goal for today finisn discharge pland and he achieved goal coping skills breathing and meditation  Chauncey Fischer 07/20/2022, 8:49 PM

## 2022-07-20 NOTE — Progress Notes (Signed)
Pt denies SI/HI/AVH. Pt remains safe on Q15 min checks and contracts for safety.  07/20/22 1850  Psych Admission Type (Psych Patients Only)  Admission Status Voluntary  Psychosocial Assessment  Patient Complaints Anxiety  Facial Expression Anxious  Affect Anxious  Speech Logical/coherent  Interaction Assertive  Motor Activity Fidgety  Appearance/Hygiene Unremarkable  Behavior Characteristics Cooperative;Appropriate to situation  Mood Anxious;Pleasant  Thought Process  Coherency WDL  Content WDL  Delusions None reported or observed  Perception WDL  Hallucination None reported or observed  Judgment Impaired  Confusion None  Danger to Self  Current suicidal ideation? Denies  Agreement Not to Harm Self Yes  Description of Agreement verbal  Danger to Others  Danger to Others None reported or observed

## 2022-07-20 NOTE — Progress Notes (Addendum)
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 The patient is currently on Hospital Day 5.   Chart Review from last 24 hours:  The patient's chart was reviewed and nursing notes were reviewed. The patient's case was discussed in multidisciplinary team meeting.  Per nursing, he reported abdominal cramps after taking Naltrexone and was unsure he wanted to remain on this med. He had no acute behavioral issues noted. He attended groups. Per MAR, he was compliant with scheduled medications and did receive Tylenol X1 yesterday and today, Vistaril X2 yesterday and X1 today, and Trazodone X1 PRN.  Information Obtained Today During Patient Interview: The patient states he is looking forward to attending CDIOP after discharge. He denies any current signs of withdrawal other than mild residual hand tremor and denies cravings for alcohol. He states he feels a little anxious not having his normal routine and being on a locked unit. He states his mood is "good" and he denies SI, HI, AVH, paranoia, ideas of reference, first rank symptoms, or delusions. He states his sleep was fair due to pain related to the mattress but his appetite is good. He states that after taking his Naltrexone he has intermittent abdominal cramps and does not wish to continue the medication. He states this has happened in the past when he tried this med. He is doing well tapering off Effexor and we discussed that as he remains sober and clean, his outpatient provider can determine if he needs a 2nd antidepressant in  combination with his Remeron. He would like to potentially discharge tomorrow.   Sleep  8 hours  Principal Problem: Alcohol dependence with alcohol-induced mood disorder (HCC) Diagnosis: Principal Problem:   Alcohol dependence with alcohol-induced mood disorder (HCC) Active Problems:   Generalized anxiety disorder   Past Psychiatric History: See H&P   Past Medical History:  Past Medical History:  Diagnosis Date   Alcohol dependence (HCC)    Anxiety    COVID-19    Hypertension    Suicidal behavior with attempted self-injury (HCC)    Family History:  Family History  Problem Relation Age of Onset   Lupus Mother    Hypertension Father    Family Psychiatric  History: See H&P  Social History: See H&P  Current Medications: Current Facility-Administered Medications  Medication Dose Route Frequency Provider Last Rate Last Admin   acetaminophen (TYLENOL) tablet 650 mg  650 mg Oral Q6H PRN Ntuen, Tina C, FNP   650 mg at 07/20/22 1002   alum & mag hydroxide-simeth (MAALOX/MYLANTA) 200-200-20 MG/5ML suspension 30 mL  30 mL Oral Q4H PRN Ntuen, Tina C, FNP       cloNIDine (CATAPRES) tablet 0.1 mg  0.1 mg Oral Q6H PRN Abbott Pao, Nadir, MD   0.1 mg at 07/18/22 0625   fluticasone (FLONASE) 50 MCG/ACT nasal spray 1 spray  1 spray Each Nare BID PRN Sindy Guadeloupe, NP       folic acid (FOLVITE) tablet 1 mg  1 mg Oral Daily Sindy Guadeloupe, NP   1 mg at 07/20/22 0813   hydrOXYzine (ATARAX) tablet 25 mg  25 mg Oral TID PRN Cecilie Lowers, FNP   25 mg at 07/20/22 1201   lisinopril (ZESTRIL) tablet 20 mg  20 mg Oral Daily Attiah, Nadir, MD   20 mg at 07/20/22 0813   loratadine (CLARITIN) tablet 10 mg  10 mg Oral QPM Attiah, Nadir, MD   10 mg at 07/19/22 1900   LORazepam (ATIVAN) tablet 0.5 mg  0.5 mg Oral BID Mason Jim, Sue Mcalexander E, MD       magnesium hydroxide (MILK OF MAGNESIA) suspension 30 mL  30 mL Oral Daily PRN Ntuen, Jesusita Oka, FNP       mirtazapine (REMERON) tablet 30 mg  30 mg Oral QHS Sindy Guadeloupe,  NP   30 mg at 07/19/22 2118   multivitamin with minerals tablet 1 tablet  1 tablet Oral Daily Abbott Pao, Nadir, MD   1 tablet at 07/20/22 0813   naltrexone (DEPADE) tablet 50 mg  50 mg Oral QHS Attiah, Nadir, MD   50 mg at 07/19/22 2118   nicotine (NICODERM CQ - dosed in mg/24 hours) patch 21 mg  21 mg Transdermal Daily Attiah, Nadir, MD   21 mg at 07/20/22 1202   thiamine (VITAMIN B1) tablet 100 mg  100 mg Oral Daily Sindy Guadeloupe, NP   100 mg at 07/20/22 0813   traZODone (DESYREL) tablet 50 mg  50 mg Oral QHS PRN Cecilie Lowers, FNP   50 mg at 07/19/22 2118    Lab Results:  No results found for this or any previous visit (from the past 48 hour(s)).   Blood Alcohol level:  Lab Results  Component Value Date   ETH 228 (H) 07/01/2022   ETH 77 (H) 07/01/2022    Metabolic Disorder Labs: Lab Results  Component Value Date   HGBA1C 4.7 (L) 07/16/2022   MPG 88.19 07/16/2022   No results found for: "PROLACTIN" Lab Results  Component Value Date   CHOL 209 (H) 07/16/2022   TRIG 385 (H) 07/16/2022   HDL 59 07/16/2022   CHOLHDL 3.5 07/16/2022   VLDL 77 (H) 07/16/2022   LDLCALC 73 07/16/2022    Physical Findings: AIMS: Facial and Oral Movements Muscles of Facial Expression: None, normal Lips and Perioral Area: None, normal Jaw: None, normal Tongue: None, normal,Extremity Movements Upper (arms, wrists, hands, fingers): None, normal Lower (legs, knees, ankles, toes): None, normal, Trunk Movements Neck, shoulders, hips: None, normal, Overall Severity Severity of abnormal movements (highest score from questions above): None, normal Incapacitation due to abnormal movements: None, normal Patient's awareness of abnormal movements (rate only patient's report): No Awareness, Dental Status Current problems with teeth and/or dentures?: No Does patient usually wear dentures?: No   Musculoskeletal: Strength & Muscle Tone: within normal limits Gait & Station: normal Patient leans:  N/A  Psychiatric Specialty Exam:  General Appearance: Fairly dressed and groomed, casually dressed   Psychomotor Activity: Fine resting hand tremors at rest  Eye Contact: Good  Speech: Normal amount, normal rate and fluency     Mood: described as "good" - appears euthymic Affect: calm, polite, stable   Thought Process: Linear, goal directed Descriptions of Associations: Intact Thought Content: Denies AVH, paranoia, delusions, SI or HI Suicidal Thoughts: Denies SI, intention, plan  Homicidal Thoughts: Denies HI, intention, plan    Alertness/Orientation: Alert, oriented to situation, place, and person   Insight: improving Judgment: improving   Memory: Intact, able to provide detailed history information   Executive Functions  Concentration: Fair Attention Span: Fair Recall: Intact Fund of Knowledge: Fair  Assets  Assets:Communication Skills; Desire for Improvement; Social Support; Physical Health  Physical Exam Vitals and nursing note reviewed.  Constitutional:      Appearance: Normal appearance.  HENT:     Head: Normocephalic.  Pulmonary:     Effort: Pulmonary effort is normal.  Neurological:     General: No focal deficit present.     Mental Status: He is alert.   Review of Systems  Respiratory:  Negative for shortness of breath.   Cardiovascular:  Negative for chest pain.  Gastrointestinal:  Negative for constipation, diarrhea, nausea and vomiting.       Abdominal cramps after Naltrexone - resolved  Neurological:  Positive for tremors. Negative for dizziness and headaches.   Blood pressure 127/89, pulse 73, temperature 97.8 F (36.6 C), temperature source Oral, resp. rate 18, height 6\' 1"  (1.854 m), weight 97.1 kg, SpO2 100 %. Body mass index is 28.23 kg/m.   Treatment Plan Summary: Diagnoses / Active Problems: Alcohol use d/o Alcohol induced mood d/o GAD  PLAN: Safety and Monitoring:  -- Voluntary admission to inpatient psychiatric unit for  safety, stabilization and treatment  -- Daily contact with patient to assess and evaluate symptoms and progress in treatment  -- Patient's case to be discussed in multi-disciplinary team meeting  -- Observation Level : q15 minute checks  -- Vital signs:  q12 hours  -- Precautions: suicide, elopement, and assault  2. Psychiatric Diagnoses and Treatment:   Alcohol induced mood d/o  -- Encouraged patient to participate in unit milieu and in scheduled group therapies    -- Finishing taper off Effexor per patient's request given not effective            -- Continue trazodone 50 mg at bedtime as needed for sleep            -- Continue Atarax 25 mg 3 times daily as needed for anxiety  -- Continue Remeron 30mg  qhs    Alcohol Use d/o  -- Finishing taper off scheduled ativan today (0.5mg  bid then stop)  -- Continue CIWA monitoring with oral thiamine and MVI replacement  -- CDIOP scheduled after discharge and abstinence from alcohol encouraged   -- stop Naltrexone at patient's request due to side-effects  -- CIWA scores 1,4,3,6 (scored higher due to anxiety)   3. Medical Issues Being Addressed:   Tobacco Use Disorder  -- Nicotine patch 21mg /24 hours ordered  -- Smoking cessation encouraged   HTN  -- Continue Lisinopril 20mg  daily with PRN Clonidine (BP Today 127/89)   AST 49  -- remainder of LFT panel WNL - will need to see PCP after discharge - likely due to alcohol use or related to elevated lipids but would recommend outpatient hepatitis panel testing    Hyperlipidemia  -- will need PCP f/u after discharge  4. Discharge Planning:   -- Social work and case management to assist with discharge planning and identification of hospital follow-up needs prior to discharge  -- Discharge Concerns: Need to establish a safety plan; Medication compliance and effectiveness  -- Discharge Goals: Return home with outpatient referrals for mental health follow-up including medication  management/psychotherapy  Total Time Spent in Direct Patient Care:  I personally spent 30 minutes on the unit in direct patient care. The direct patient care time included face-to-face time with the patient, reviewing the patient's chart, communicating with other professionals, and coordinating care. Greater than 50% of this time was spent in counseling or coordinating care with the patient regarding goals of  hospitalization, psycho-education, and discharge planning needs.   Harlow Asa, MD, FAPA 07/20/2022, 1:39 PM

## 2022-07-20 NOTE — Progress Notes (Signed)
    07/20/22 1955  Psych Admission Type (Psych Patients Only)  Admission Status Voluntary  Psychosocial Assessment  Patient Complaints Anxiety  Eye Contact Fair  Facial Expression Anxious  Affect Anxious  Speech Logical/coherent  Interaction Assertive  Motor Activity Fidgety  Appearance/Hygiene Unremarkable  Behavior Characteristics Cooperative;Appropriate to situation  Mood Anxious;Pleasant  Thought Process  Coherency WDL  Content WDL  Delusions None reported or observed  Perception WDL  Hallucination None reported or observed  Judgment Impaired  Confusion None  Danger to Self  Current suicidal ideation? Denies  Danger to Others  Danger to Others None reported or observed

## 2022-07-20 NOTE — Progress Notes (Signed)
Pt attended the AA meeting earlier tonight. Pt said that he plans on attending AA meetings again upon discharge. He shared that he has a friend that was able to stay sober for a year and he looks up to her. He used to attend meetings with her before, but not consistently. He is looking forward to participating in his 8 week intensive outpatient treatment program. Pt continues to endorse withdrawal symptoms of feeling anxious. Pt c/o abdominal cramps and was educated that it's a common side effect of naltrexone and to let staff know if it worsens. Pt has been calm and cooperative. Pt denies SI/HI and AVH. Active listening, reassurance, and support provided. Q 15 min safety checks continue. Pt's safety has been maintained.   07/19/22 2118  Psych Admission Type (Psych Patients Only)  Admission Status Voluntary  Psychosocial Assessment  Patient Complaints Anxiety;Nervousness;Sleep disturbance  Eye Contact Fair  Facial Expression Anxious  Affect Anxious;Appropriate to circumstance  Speech Logical/coherent  Interaction Assertive  Motor Activity Fidgety  Appearance/Hygiene Unremarkable  Behavior Characteristics Cooperative;Appropriate to situation;Anxious  Mood Anxious;Pleasant  Thought Process  Coherency WDL  Content WDL  Delusions None reported or observed  Perception WDL  Hallucination None reported or observed  Judgment Limited  Confusion None  Danger to Self  Current suicidal ideation? Denies  Agreement Not to Harm Self Yes  Description of Agreement verbally contracts for safety  Danger to Others  Danger to Others None reported or observed

## 2022-07-21 ENCOUNTER — Encounter (HOSPITAL_COMMUNITY): Payer: Self-pay

## 2022-07-21 DIAGNOSIS — F1024 Alcohol dependence with alcohol-induced mood disorder: Principal | ICD-10-CM

## 2022-07-21 MED ORDER — LISINOPRIL 20 MG PO TABS: 20.0000 mg | ORAL_TABLET | Freq: Every day | ORAL | 0 refills | Status: DC

## 2022-07-21 MED ORDER — HYDROXYZINE HCL 25 MG PO TABS: 25.0000 mg | ORAL_TABLET | Freq: Three times a day (TID) | ORAL | 0 refills | Status: DC | PRN

## 2022-07-21 MED ORDER — TRAZODONE HCL 50 MG PO TABS: 50.0000 mg | ORAL_TABLET | Freq: Every evening | ORAL | 0 refills | Status: DC | PRN

## 2022-07-21 MED ORDER — MIRTAZAPINE 30 MG PO TABS: 30.0000 mg | ORAL_TABLET | Freq: Every day | ORAL | 0 refills | Status: DC

## 2022-07-21 MED ORDER — NICOTINE 21 MG/24HR TD PT24: 21.0000 mg | MEDICATED_PATCH | Freq: Every day | TRANSDERMAL | 0 refills | Status: DC | PRN

## 2022-07-21 NOTE — BHH Suicide Risk Assessment (Signed)
[facility] Discharge Suicide Risk Assessment   Principal Problem: Alcohol dependence with alcohol-induced mood disorder [facility]) Discharge Diagnoses: Principal Problem:   Alcohol dependence with alcohol-induced mood disorder (HCC) Active Problems:   Generalized anxiety disorder  Total Time Spent in Direct Patient Care:  I personally spent 35 minutes on the unit in direct patient care. The direct patient care time included face-to-face time with the patient, reviewing the patient's chart, communicating with other professionals, and coordinating care. Greater than 50% of this time was spent in counseling or coordinating care with the patient regarding goals of hospitalization, psycho-education, and discharge planning needs.  Subjective: Patient was seen on rounds. He admits he is mildly anxious with plans to transition home but overall feels his mood is improved. He denies SI, HI, AVH, paranoia or delusions. He denies medication side-effects. He denies acute signs of withdrawal and CIWA score was 1 this morning.  He was encouraged to start scheduled CDIOP after discharge and appeared forward thinking on exam. He was advised to abstain from alcohol and illicit drug use after discharge. He was advised that his outpatient provider can determine if he needs a dose increase in his Remeron or start of a 2nd antidepressant as he maintains more clean and sober time off of alcohol. He was advised to see a primary care provider after discharge for monitoring of his hypertension, management of his elevated triglycerides/lipids, and recheck of his mildly elevated liver enzymes. He is to avoid alcohol and large doses of Tylenol after discharge. He could articulate a safety and discharge plan. Time was given for questions.   Musculoskeletal: Strength & Muscle Tone: within normal limits Gait & Station: normal Patient leans: N/A  Psychiatric Specialty Exam  Presentation  General Appearance: Appropriate for Environment;  Casual; Fairly Groomed  Eye Contact:Good  Speech:Clear and Coherent; Normal Rate  Speech Volume:Normal  Mood and Affect  Mood:mildly anxious  Affect: moderate, stable  Thought Process  Thought Processes:Coherent; Linear  Descriptions of Associations:Intact  Orientation:Full (Time, Place and Person)  Thought Content:Denies AVH, paranoia, delusions, SI or HI  Hallucinations:Denied  Ideas of Reference:None  Suicidal Thoughts:Denied  Homicidal Thoughts:Denied  Sensorium  Memory:Immediate Fair; Recent Fair; Remote Fair  Judgment:Fair  Insight:Fair   Executive Functions  Concentration:Good  Attention Span:Good  Recall:Good  Fund of Knowledge:Good  Language:Good   Psychomotor Activity  Psychomotor Activity:Normal  Assets  Assets:Communication Skills; Desire for Improvement; Social Support; Physical Health   Sleep  7 hours  Physical Exam Vitals and nursing note reviewed.  Constitutional:      Appearance: Normal appearance.  HENT:     Head: Normocephalic.  Pulmonary:     Effort: Pulmonary effort is normal.  Neurological:     General: No focal deficit present.     Mental Status: He is alert.    Review of Systems  Respiratory:  Negative for shortness of breath.   Cardiovascular:  Negative for chest pain.  Gastrointestinal:  Negative for abdominal pain, constipation, diarrhea, nausea and vomiting.  Neurological:  Negative for tremors and headaches.   Blood pressure (!) 137/96, pulse 92, temperature 97.9 F (36.6 C), temperature source Oral, resp. rate 18, height 6\' 1"  (1.854 m), weight 97.1 kg, SpO2 97 %. Body mass index is 28.23 kg/m.  Mental Status Per Nursing Assessment::   Demographic Factors:  Male, Caucasian, and young adult  Loss Factors: Financial problems/change in socioeconomic status  Historical Factors: Substance use prior to admission, previous psychiatric diagnoses/treatments  Risk Reduction Factors:   Responsible for  children under 48 years of age, Positive social support, and Positive coping skills or problem solving skills, employed  Continued Clinical Symptoms:  Depression:   Comorbid alcohol abuse/dependence Alcohol/Substance Abuse/Dependencies More than one psychiatric diagnosis Previous Psychiatric Diagnoses and Treatments  Cognitive Features That Contribute To Risk:  None    Suicide Risk:  Mild:  There are no identifiable plans, no associated intent, mild dysphoria and related symptoms,some other risk factors, and identifiable protective factors, including available and accessible social support.   Follow-up Information     [facility]. Go on 07/26/2022.   Specialty: Behavioral Health Why: You have an appointment on 07/26/22 at 2:30 pm.    This program meets in person M, W, F from 9 am to 12 pm and runs for 8 to 12 weeks. Clients can also receive individual and family therapy, and MAT.  There is weekly drug testing.  The program is abstinence-based and AA, NA, [facility], etc. attendance is encouraged.  Medication management is also available.  For questions, please call [phone]. Contact information: [address] [id number] mc [place] [address] [phone]        [facility]. Go on 08/13/2022.   Why: You have an appointment to establish care with this provider for primary care services on 08/13/22 at 2:10 pm.  This appointment will be held in person. Contact information: [address] [place] [address] [phone]        [facility], [facility] Follow up.   Why: You have an unpaid balance with this provider, and once payment arrangements are made, you may schedule appoinments for therapy and medication management services. Contact information: [address]  [address] [place] [address] [phone]                  Plan Of Care/Follow-up recommendations:  Activity:  as tolerated Diet:  heart healthy Other:  Patient advised to see primary care provider without fail for management of his high blood pressure, recheck of his elevated lipids, and recheck of his mildly elevated liver enzymes. He was advised to abstain from alcohol and illicit drugs and to work on smoking cessation. He was advised to avoid large doses of Tylenol. He was encouraged to start intensive outpatient program for substance use after discharge and to discuss further dose adjustments in his antidepressant with his outpatient provider.   Matsunaga Locket, MD, FAPA 07/21/2022, 7:15 AM

## 2022-07-21 NOTE — Discharge Summary (Signed)
Physician Discharge Summary Note  Patient:  [name] is an 31 y.o., male MRN:  [id number] DOB:  02-Dec-1991 Patient phone:  [phone] (home)  Patient address:   [address] [place] [address],   Total Time Spent in Direct Patient Care:  I personally spent 35 minutes on the unit in direct patient care. The direct patient care time included face-to-face time with the patient, reviewing the patient's chart, communicating with other professionals, and coordinating care. Greater than 50% of this time was spent in counseling or coordinating care with the patient regarding goals of hospitalization, psycho-education, and discharge planning needs.  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   Principal Problem: Alcohol dependence with alcohol-induced mood disorder (HCC) Discharge Diagnoses: Principal Problem:   Alcohol dependence with alcohol-induced mood disorder (HCC) Active Problems:   Generalized anxiety disorder HTN Mildly elevated LFTs Hyperlipidemia  Past Psychiatric History: see H&P  Past Medical History:  Past Medical History:  Diagnosis Date   Alcohol dependence (HCC)    Anxiety    COVID-19    Hypertension    Suicidal behavior with attempted self-injury (HCC)     Family History:  Family History  Problem Relation Age of Onset   Lupus Mother    Hypertension Father    Family Psychiatric  History: see H&P  Social History:  Social History   Substance and Sexual Activity  Alcohol Use Yes   Alcohol/week: 70.0 standard drinks of alcohol   Types: 70 Standard  drinks or equivalent per week     Social History   Substance and Sexual Activity  Drug Use Not Currently   Types: Benzodiazepines, Cocaine    Social History   Socioeconomic History   Marital status: Single    Spouse name: Not on file   Number of children: 1   Years of education: Not on file   Highest education level: Not on file  Occupational History   Not on file  Tobacco Use   Smoking status: Never   Smokeless tobacco: Never  Vaping Use   Vaping Use: Every day   Substances: Nicotine, Flavoring  Substance and Sexual Activity   Alcohol use: Yes    Alcohol/week: 70.0 standard drinks of alcohol    Types: 70 Standard drinks or equivalent per week   Drug use: Not Currently    Types: Benzodiazepines, Cocaine   Sexual activity: Yes  Other Topics Concern   Not on file  Social History Narrative   Not on file   Social Determinants of Health   Financial Resource Strain: Not on file  Food Insecurity: Not on file  Transportation Needs: Not on file  Physical Activity: Not on file  Stress: Not on file  Social Connections: Not on file    Hospital Course:  The patient was admitted to [facility] where he was seen daily by APP or attending psychiatrist and his care was discussed in multi-disciplinary team meeting. He was monitored with q15 minute safety checks.   On admission, he was started on a scheduled Ativan taper and was monitored with CIWA for alcohol withdrawal. He was provided oral MVI and  thiamine replacement and had no complicated withdrawal during admission. He agreed to start CDIOP after discharge and declined residential rehab referral. He declined trial of Neurontin. He was given trial of Naltrexone for alcohol use d/o but had side-effects with the medication and requested to stop the drug.   For mood issues, he requested to taper off home Effexor due to lack of perceived efficacy and tolerated gradual med discontinuation without issue. He was continued on home dose of Remeron  30mg  qhs for depression and was advised that his outpatient provider could reassess after a period of sobriety from alcohol to determine if he needed further dose increase in Remeron or start of a 2nd antidepressant for any additional mood issues. He reported stable and improved mood prior to discharge. He was given PRN Vistaril for anxiety and PRN Trazodone for sleep.  For blood pressure he was started on Lisinopril 10mg  titrating up to 20mg  prior to discharge and was advised to see his PCP for BP management. He was provided nicotine patches for nicotine replacement and smoking cessation was encouraged. He was noted during admission to have elevated triglycerides and cholesterol and was encouraged to work on lifestyle and dietary changes and see a PCP for recheck after discharge. He was also noted to have mildly elevated LFTs which was felt likely to be related to alcohol use or elevated lipids. He declined additional testing during admission and was advised to have hepatitis panel and trending of LFTs with his outpatient PCP after discharge.   Gradually he started participating in groups and in the milieu. He had no acute behavioral issues, safety concerns or psychosis during admission. On day of discharge, He admitted he was mildly anxious with plans to transition home but overall felt his mood was stable and improved. He denied SI, HI, AVH, paranoia or delusions. He denied medication side-effects. He denied acute signs of withdrawal and CIWA score was 1 on day of discharge.  He was encouraged to start scheduled CDIOP after discharge and appeared forward thinking on exam. He was advised to abstain from alcohol and illicit drug use after discharge. He was advised that his outpatient provider can determine if he needs a dose increase in his Remeron or start of a 2nd antidepressant as he maintains more clean and sober time off of alcohol. He was advised to see a primary care provider after discharge for  monitoring of his hypertension, management of his elevated triglycerides/lipids, and recheck of his mildly elevated liver enzymes. He was told to avoid alcohol and large doses of Tylenol after discharge. He could articulate a safety and discharge plan. Smoking cessation was encouraged.  Physical Findings: AIMS: Facial and Oral Movements Muscles of Facial Expression: None, normal Lips and Perioral Area: None, normal Jaw: None, normal Tongue: None, normal,Extremity Movements Upper (arms, wrists, hands, fingers): None, normal Lower (legs, knees, ankles, toes): None, normal, Trunk Movements Neck, shoulders, hips: None, normal, Overall Severity Severity of abnormal movements (highest score from questions above): None, normal Incapacitation due to abnormal movements: None, normal Patient's awareness of abnormal movements (rate only patient's report): No Awareness, Dental Status Current problems with teeth and/or dentures?: No Does patient usually wear dentures?: No  CIWA:  CIWA-Ar Total: 0  Musculoskeletal: Strength & Muscle Tone: within normal limits Gait & Station: normal Patient leans: N/A   Psychiatric Specialty Exam   Presentation  General Appearance: Appropriate for Environment; Casual; Fairly Groomed   Eye Contact:Good   Speech:Clear and Coherent; Normal Rate   Speech Volume:Normal  Mood and Affect  Mood:mildly anxious   Affect: moderate, stable   Thought Process  Thought Processes:Coherent; Linear   Descriptions of Associations:Intact   Orientation:Full (Time, Place and Person)   Thought Content:Denies AVH, paranoia, delusions, SI or HI   Hallucinations:Denied   Ideas of Reference:None   Suicidal Thoughts:Denied   Homicidal Thoughts:Denied   Sensorium  Memory:Immediate Fair; Recent Fair; Remote Fair   Judgment:Fair   Insight:Fair     Executive Functions  Concentration:Good   Attention Span:Good   Recall:Good   Fund of Knowledge:Good    Language:Good     Psychomotor Activity  Psychomotor Activity:Normal   Assets  Assets:Communication Skills; Desire for Improvement; Social Support; Physical Health     Sleep  7 hours   Physical Exam Vitals and nursing note reviewed.  Constitutional:      Appearance: Normal appearance.  HENT:     Head: Normocephalic.  Pulmonary:     Effort: Pulmonary effort is normal.  Neurological:     General: No focal deficit present.     Mental Status: He is alert.      Review of Systems  Respiratory:  Negative for shortness of breath.   Cardiovascular:  Negative for chest pain.  Gastrointestinal:  Negative for abdominal pain, constipation, diarrhea, nausea and vomiting.  Neurological:  Negative for tremors and headaches.    Blood pressure (!) 137/96, pulse 92, temperature 97.9 F (36.6 C), temperature source Oral, resp. rate 18, height 6\' 1"  (1.854 m), weight 97.1 kg, SpO2 97 %. Body mass index is 28.23 kg/m. Social History   Tobacco Use  Smoking Status Never  Smokeless Tobacco Never   Tobacco Cessation:  A prescription for an FDA-approved tobacco cessation medication provided at discharge   Blood Alcohol level:  Lab Results  Component Value Date   ETH 228 (H) 07/01/2022   ETH 77 (H) 07/01/2022    Metabolic Disorder Labs:  Lab Results  Component Value Date   HGBA1C 4.7 (L) 07/16/2022   MPG 88.19 07/16/2022   No results found for: "PROLACTIN" Lab Results  Component Value Date   CHOL 209 (H) 07/16/2022   TRIG 385 (H) 07/16/2022   HDL 59 07/16/2022   CHOLHDL 3.5 07/16/2022   VLDL 77 (H) 07/16/2022   LDLCALC 73 07/16/2022    See Psychiatric Specialty Exam and Suicide Risk Assessment completed by Attending Physician prior to discharge.  Discharge destination:  Home  Is patient on multiple antipsychotic therapies at discharge:  No   Has Patient had three or more failed trials of antipsychotic monotherapy by history:  No  Recommended Plan for Multiple  Antipsychotic Therapies: NA   Allergies as of 07/21/2022       Reactions   Benadryl [diphenhydramine] Anxiety, Other (See Comments)   Increased anxiety   Codeine Nausea And Vomiting, Rash        Medication List     STOP taking these medications    levocetirizine 5 MG tablet Commonly known as: XYZAL   venlafaxine XR 75 MG 24 hr capsule Commonly known as: EFFEXOR-XR       TAKE these medications      Indication  fluticasone 50 MCG/ACT nasal spray Commonly known as: FLONASE Place 1 spray into both nostrils 2 (two) times daily as needed for allergies.  Indication: Allergic Rhinitis   hydrOXYzine 25 MG tablet Commonly known as: ATARAX Take 1 tablet (25 mg total) by mouth every 8 (eight) hours as needed for anxiety.  Indication: Feeling Anxious   lisinopril 20  MG tablet Commonly known as: ZESTRIL Take 1 tablet (20 mg total) by mouth daily.  Indication: High Blood Pressure Disorder   mirtazapine 30 MG tablet Commonly known as: Remeron Take 1 tablet (30 mg total) by mouth at bedtime.  Indication: Major Depressive Disorder   nicotine 21 mg/24hr patch Commonly known as: NICODERM CQ - dosed in mg/24 hours Place 1 patch (21 mg total) onto the skin daily as needed (smoking cessation).  Indication: Nicotine Addiction   traZODone 50 MG tablet Commonly known as: DESYREL Take 1 tablet (50 mg total) by mouth at bedtime as needed for sleep.  Indication: Trouble Sleeping        Follow-up Information     BE[facility]AVIORAL [facility]ALTH INTENSIVE CHEMICAL DEPENDENCY. Go on 07/26/2022.   Specialty: Behavioral Health Why: You have an appointment on 07/26/22 at 2:30 pm.    This program meets in person M, W, F from 9 am to 12 pm and runs for 8 to 12 weeks. Clients can also receive individual and family therapy, and MAT.  There is weekly drug testing.  The program is abstinence-based and AA, NA, Smart Recovery, etc. attendance is encouraged.  Medication management is also available.  For  questions, please call [phone]. Contact information: [address] [id number] mc [place] [address] [phone]        [place] [facility]. Go on 08/13/2022.   Why: You have an appointment to establish care with this provider for primary care services on 08/13/22 at 2:10 pm.  This appointment will be held in person. Contact information: [address] [place] [address] [phone]        [facility], [facility] Follow up.   Why: You have an unpaid balance with this provider, and once payment arrangements are made, you may schedule appoinments for therapy and medication management services. Contact information: [address]  [address] [place] [address] [phone]                 Follow-up recommendations:   Activity:  as tolerated Diet:  heart healthy Other:  Patient advised to see primary care provider without fail for management of his high blood pressure, recheck of his elevated lipids, and recheck of his mildly elevated liver enzymes. He was advised to abstain from alcohol and illicit drugs and to work on smoking cessation. He was advised to avoid large doses of Tylenol. He was encouraged to start intensive outpatient program for substance use after discharge and to discuss further dose adjustments in his antidepressant with his outpatient provider.   Signed: Goecke Locket, MD, FAPA 07/21/2022, 4:19 PM

## 2022-07-21 NOTE — Progress Notes (Signed)
  [facility] Adult Case Management Discharge Plan :  Will you be returning to the same living situation after discharge:  No. Patient will be living with his mother At discharge, do you have transportation home?: Yes,  mother will pick patient up Do you have the ability to pay for your medications: Yes,  insurance  Release of information consent forms completed and in the chart;  Patient's signature needed at discharge.  Patient to Follow up at:  Follow-up Information     BEHAVIORAL HEALTH INTENSIVE CHEMICAL DEPENDENCY. Go on 07/26/2022.   Specialty: Behavioral Health Why: You have an appointment on 07/26/22 at 2:30 pm.    This program meets in person M, W, F from 9 am to 12 pm and runs for 8 to 12 weeks. Clients can also receive individual and family therapy, and MAT.  There is weekly drug testing.  The program is abstinence-based and AA, NA, Smart Recovery, etc. attendance is encouraged.  Medication management is also available.  For questions, please call [phone]. Contact information: [address] [id number] mc [place] [address] [phone]        [facility] [facility]. Go on 08/13/2022.   Why: You have an appointment to establish care with this provider for primary care services on 08/13/22 at 2:10 pm.  This appointment will be held in person. Contact information: [address] [place] [address] [phone]        [facility], [facility] Follow up.   Why: You have an unpaid balance with this provider, and once payment arrangements are made, you may schedule appoinments for therapy and medication management services. Contact information: [address]  [address] [place] [address] [phone]                 Next level of care provider has access to [facility] Link:yes  Safety Planning and Suicide Prevention discussed: Yes,  mother     Has patient been  referred to the Quitline?: N/A patient is not a smoker  Patient has been referred for addiction treatment: Yes, CDIOP with [facility]  Destynee Stringfellow E Sylvestre Rathgeber, LCSW 07/21/2022, 10:52 AM

## 2022-07-21 NOTE — BH IP Treatment Plan (Signed)
Interdisciplinary Treatment and Diagnostic Plan Update  07/21/2022 Time of Session: 9:45am [name] MRN: [id number]  Principal Diagnosis: Alcohol dependence with alcohol-induced mood disorder (HCC)  Secondary Diagnoses: Principal Problem:   Alcohol dependence with alcohol-induced mood disorder (HCC) Active Problems:   Generalized anxiety disorder   Current Medications:  Current Facility-Administered Medications  Medication Dose Route Frequency Provider Last Rate Last Admin   acetaminophen (TYLENOL) tablet 650 mg  650 mg Oral Q6H PRN Ntuen, Jesusita Oka, FNP   650 mg at 07/20/22 1002   alum & mag hydroxide-simeth (MAALOX/MYLANTA) 200-200-20 MG/5ML suspension 30 mL  30 mL Oral Q4H PRN Ntuen, Jesusita Oka, FNP       cloNIDine (CATAPRES) tablet 0.1 mg  0.1 mg Oral Q6H PRN Abbott Pao, Nadir, MD   0.1 mg at 07/18/22 0625   fluticasone (FLONASE) 50 MCG/ACT nasal spray 1 spray  1 spray Each Nare BID PRN Sindy Guadeloupe, NP       folic acid (FOLVITE) tablet 1 mg  1 mg Oral Daily Sindy Guadeloupe, NP   1 mg at 07/21/22 0805   hydrOXYzine (ATARAX) tablet 25 mg  25 mg Oral TID PRN Cecilie Lowers, FNP   25 mg at 07/20/22 2001   lisinopril (ZESTRIL) tablet 20 mg  20 mg Oral Daily Attiah, Nadir, MD   20 mg at 07/21/22 0805   loratadine (CLARITIN) tablet 10 mg  10 mg Oral QPM Attiah, Nadir, MD   10 mg at 07/20/22 1822   magnesium hydroxide (MILK OF MAGNESIA) suspension 30 mL  30 mL Oral Daily PRN Ntuen, Jesusita Oka, FNP       mirtazapine (REMERON) tablet 30 mg  30 mg Oral QHS Sindy Guadeloupe, NP   30 mg at 07/20/22 2111   multivitamin with minerals tablet 1 tablet  1 tablet Oral Daily Attiah, Nadir, MD   1 tablet at 07/21/22 0805   nicotine (NICODERM CQ - dosed in mg/24 hours) patch 21 mg  21 mg Transdermal Daily Attiah, Nadir, MD   21 mg at 07/20/22 1202   thiamine (VITAMIN B1) tablet 100 mg  100 mg Oral Daily Sindy Guadeloupe, NP   100 mg at 07/20/22 0813   traZODone (DESYREL) tablet 50 mg  50 mg Oral QHS PRN Cecilie Lowers,  FNP   50 mg at 07/20/22 2111   PTA Medications: Medications Prior to Admission  Medication Sig Dispense Refill Last Dose   fluticasone (FLONASE) 50 MCG/ACT nasal spray Place 1 spray into both nostrils 2 (two) times daily as needed for allergies.      levocetirizine (XYZAL) 5 MG tablet Take 5 mg by mouth every evening.      venlafaxine XR (EFFEXOR-XR) 75 MG 24 hr capsule Take 1 capsule (75 mg total) by mouth daily with breakfast. (Patient taking differently: Take 150 mg by mouth daily with breakfast.) 30 capsule 3    [DISCONTINUED] mirtazapine (REMERON) 30 MG tablet Take 1 tablet (30 mg total) by mouth at bedtime. 30 tablet 3     Patient Stressors: Financial difficulties   Marital or family conflict   Substance abuse    Patient Strengths: Motivation for treatment/growth  Supportive family/friends   Treatment Modalities: Medication Management, Group therapy, Case management,  1 to 1 session with clinician, Psychoeducation, Recreational therapy.   Physician Treatment Plan for Primary Diagnosis: Alcohol dependence with alcohol-induced mood disorder (HCC) Long Term Goal(s): Improvement in symptoms so as ready for discharge   Short Term Goals: Ability to identify changes in lifestyle to  reduce recurrence of condition will improve Ability to verbalize feelings will improve Ability to disclose and discuss suicidal ideas Ability to demonstrate self-control will improve Ability to identify and develop effective coping behaviors will improve  Medication Management: Evaluate patient's response, side effects, and tolerance of medication regimen.  Therapeutic Interventions: 1 to 1 sessions, Unit Group sessions and Medication administration.  Evaluation of Outcomes: Adequate for Discharge  Physician Treatment Plan for Secondary Diagnosis: Principal Problem:   Alcohol dependence with alcohol-induced mood disorder (HCC) Active Problems:   Generalized anxiety disorder  Long Term Goal(s):  Improvement in symptoms so as ready for discharge   Short Term Goals: Ability to identify changes in lifestyle to reduce recurrence of condition will improve Ability to verbalize feelings will improve Ability to disclose and discuss suicidal ideas Ability to demonstrate self-control will improve Ability to identify and develop effective coping behaviors will improve     Medication Management: Evaluate patient's response, side effects, and tolerance of medication regimen.  Therapeutic Interventions: 1 to 1 sessions, Unit Group sessions and Medication administration.  Evaluation of Outcomes: Adequate for Discharge   RN Treatment Plan for Primary Diagnosis: Alcohol dependence with alcohol-induced mood disorder (HCC) Long Term Goal(s): Knowledge of disease and therapeutic regimen to maintain health will improve  Short Term Goals: Ability to remain free from injury will improve, Ability to participate in decision making will improve, Ability to verbalize feelings will improve, Ability to disclose and discuss suicidal ideas, and Ability to identify and develop effective coping behaviors will improve  Medication Management: RN will administer medications as ordered by provider, will assess and evaluate patient's response and provide education to patient for prescribed medication. RN will report any adverse and/or side effects to prescribing provider.  Therapeutic Interventions: 1 on 1 counseling sessions, Psychoeducation, Medication administration, Evaluate responses to treatment, Monitor vital signs and CBGs as ordered, Perform/monitor CIWA, COWS, AIMS and Fall Risk screenings as ordered, Perform wound care treatments as ordered.  Evaluation of Outcomes: Adequate for Discharge   LCSW Treatment Plan for Primary Diagnosis: Alcohol dependence with alcohol-induced mood disorder (HCC) Long Term Goal(s): Safe transition to appropriate next level of care at discharge, Engage patient in therapeutic  group addressing interpersonal concerns.  Short Term Goals: Engage patient in aftercare planning with referrals and resources, Increase social support, Increase emotional regulation, Facilitate acceptance of mental health diagnosis and concerns, Identify triggers associated with mental health/substance abuse issues, and Increase skills for wellness and recovery  Therapeutic Interventions: Assess for all discharge needs, 1 to 1 time with Social worker, Explore available resources and support systems, Assess for adequacy in community support network, Educate family and significant other(s) on suicide prevention, Complete Psychosocial Assessment, Interpersonal group therapy.  Evaluation of Outcomes: Adequate for Discharge   Progress in Treatment: Attending groups: No. Participating in groups: No. Taking medication as prescribed: Yes. Toleration medication: Yes. Family/Significant other contact made: Yes, will contact:  Mother  Patient understands diagnosis: Yes. Discussing patient identified problems/goals with staff: Yes. Medical problems stabilized or resolved: Yes. Denies suicidal/homicidal ideation: No. Issues/concerns per patient self-inventory: Yes. Other: none   New problem(s) identified: No, Describe:  none    New Short Term/Long Term Goal(s): Patient to work towards detox, medication management for mood stabilization; elimination of SI thoughts; development of comprehensive mental wellness/sobriety plan.   Patient Goals:  Patient states their goal for treatment is to "get back into outpatient and quit (substance use)."   Discharge Plan or Barriers: Patient will return home and follow up  with [facility] for therapy and medication management.    Reason for Continuation of Hospitalization: Medication Stabilization    Estimated Length of Stay: Adequate for Discharge    Last 3 [place] Suicide Severity Risk Score: Flowsheet Row Admission (Current) from [facility]  [facility] Most recent reading at 07/15/2022 10:14 PM ED to Hosp-Admission (Discharged) from 07/01/2022 in [place] LONG 4TH FLOOR PROGRESSIVE CARE AND UROLOGY Most recent reading at 07/02/2022  2:12 PM ED from 07/01/2022 in [facility] EMERGENCY DEPARTMENT Most recent reading at 07/01/2022  7:25 AM  C-SSRS RISK CATEGORY No Risk No Risk No Risk       Last PHQ 2/9 Scores:    05/05/2022   11:25 AM 12/13/2021    1:47 PM 10/28/2021   10:36 AM  Depression screen PHQ 2/9  Decreased Interest 1 1 1   Down, Depressed, Hopeless 1 1 1   PHQ - 2 Score 2 2 2   Altered sleeping 0 1 2  Tired, decreased energy 1 0 0  Change in appetite 2 1 3   Feeling bad or failure about yourself  0 1 0  Trouble concentrating 2 1 1   Moving slowly or fidgety/restless 2 0 2  Suicidal thoughts 0 0 0  PHQ-9 Score 9 6 10   Difficult doing work/chores Not difficult at all Somewhat difficult Somewhat difficult    Scribe for Treatment Team: , 07/21/2022 9:13 AM

## 2022-07-21 NOTE — Progress Notes (Signed)
[name]  D/C'd Home per MD order.  Discussed with the patient and all questions fully answered.  An After Visit Summary was printed and given to the patient. Patient received prescription. [facility] discharge suicide risk assessment given to patient at D/C. Patient denies SI/HI/AVH at D/C.  D/c education completed with patient including follow up instructions, medication list, d/c activities limitations if indicated, with other d/c instructions as indicated by MD - patient able to verbalize understanding, all questions fully answered.   Patient instructed to return to ED, call 911, or call MD for any changes in condition.   Patient escorted to the main entrance, and D/C home via private auto.  Melvenia Needles 07/21/2022 1:53 PM

## 2022-07-26 ENCOUNTER — Ambulatory Visit (INDEPENDENT_AMBULATORY_CARE_PROVIDER_SITE_OTHER): Payer: Commercial Managed Care - HMO | Admitting: Licensed Clinical Social Worker

## 2022-07-26 ENCOUNTER — Other Ambulatory Visit (HOSPITAL_COMMUNITY): Payer: Commercial Managed Care - HMO | Attending: Medical | Admitting: Licensed Clinical Social Worker

## 2022-07-26 ENCOUNTER — Encounter (HOSPITAL_COMMUNITY): Payer: Self-pay

## 2022-07-26 DIAGNOSIS — F1520 Other stimulant dependence, uncomplicated: Secondary | ICD-10-CM

## 2022-07-26 DIAGNOSIS — F1121 Opioid dependence, in remission: Secondary | ICD-10-CM

## 2022-07-26 DIAGNOSIS — F419 Anxiety disorder, unspecified: Secondary | ICD-10-CM | POA: Insufficient documentation

## 2022-07-26 DIAGNOSIS — F32A Depression, unspecified: Secondary | ICD-10-CM | POA: Insufficient documentation

## 2022-07-26 DIAGNOSIS — F1020 Alcohol dependence, uncomplicated: Secondary | ICD-10-CM
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
## 2022-07-26 NOTE — Plan of Care (Signed)
[name] verbally agrees to the following Care Plan:  Problem:  Substance Use Goal:  [name] will abstain from drugs and alcohol 7/7 days per week per self-report and weekly UDS. Outcome: Not Applicable
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
## 2022-07-28 ENCOUNTER — Other Ambulatory Visit (HOSPITAL_COMMUNITY): Payer: Commercial Managed Care - HMO | Attending: Medical | Admitting: Licensed Clinical Social Worker

## 2022-07-28 DIAGNOSIS — E781 Pure hyperglyceridemia: Secondary | ICD-10-CM

## 2022-07-28 DIAGNOSIS — F1520 Other stimulant dependence, uncomplicated: Secondary | ICD-10-CM

## 2022-07-28 DIAGNOSIS — F17290 Nicotine dependence, other tobacco product, uncomplicated: Secondary | ICD-10-CM

## 2022-07-28 DIAGNOSIS — F419 Anxiety disorder, unspecified: Secondary | ICD-10-CM

## 2022-07-28 DIAGNOSIS — F341 Dysthymic disorder: Secondary | ICD-10-CM | POA: Diagnosis not present

## 2022-07-28 DIAGNOSIS — J301 Allergic rhinitis due to pollen: Secondary | ICD-10-CM | POA: Diagnosis not present

## 2022-07-28 DIAGNOSIS — F1121 Opioid dependence, in remission: Secondary | ICD-10-CM | POA: Diagnosis not present

## 2022-07-28 DIAGNOSIS — F19988 Other psychoactive substance use, unspecified with other psychoactive substance-induced disorder: Secondary | ICD-10-CM | POA: Insufficient documentation

## 2022-07-28 DIAGNOSIS — E782 Mixed hyperlipidemia: Secondary | ICD-10-CM | POA: Insufficient documentation

## 2022-07-28 DIAGNOSIS — F1024 Alcohol dependence with alcohol-induced mood disorder: Secondary | ICD-10-CM | POA: Insufficient documentation

## 2022-07-28 DIAGNOSIS — I10 Essential (primary) hypertension: Secondary | ICD-10-CM | POA: Insufficient documentation

## 2022-07-28 DIAGNOSIS — Z818 Family history of other mental and behavioral disorders: Secondary | ICD-10-CM | POA: Diagnosis not present

## 2022-07-28 DIAGNOSIS — F445 Conversion disorder with seizures or convulsions: Secondary | ICD-10-CM | POA: Diagnosis not present

## 2022-07-28 DIAGNOSIS — K7010 Alcoholic hepatitis without ascites: Secondary | ICD-10-CM | POA: Insufficient documentation

## 2022-07-28 DIAGNOSIS — G909 Disorder of the autonomic nervous system, unspecified: Secondary | ICD-10-CM | POA: Insufficient documentation

## 2022-07-28 DIAGNOSIS — F410 Panic disorder [episodic paroxysmal anxiety] without agoraphobia: Secondary | ICD-10-CM | POA: Insufficient documentation

## 2022-07-28 DIAGNOSIS — F1020 Alcohol dependence, uncomplicated: Secondary | ICD-10-CM

## 2022-07-28 DIAGNOSIS — Z811 Family history of alcohol abuse and dependence: Secondary | ICD-10-CM

## 2022-07-28 DIAGNOSIS — Z9151 Personal history of suicidal behavior: Secondary | ICD-10-CM | POA: Diagnosis not present

## 2022-07-28 DIAGNOSIS — F418 Other specified anxiety disorders: Secondary | ICD-10-CM

## 2022-07-28 MED ORDER — BACLOFEN 10 MG PO TABS: 10.0000 mg | ORAL_TABLET | Freq: Three times a day (TID) | ORAL | 1 refills | Status: DC

## 2022-07-28 MED ORDER — VENLAFAXINE HCL ER 75 MG PO CP24: 75.0000 mg | ORAL_CAPSULE | Freq: Every day | ORAL | 2 refills | Status: DC

## 2022-07-28 NOTE — Progress Notes (Signed)
Daily Group Progress Note  Program: CD IOP   Group Time: 9 a.m. to 12 p.m.  Type of Therapy: Process and Psychoeducational   Topic: The therapist checks in with group members, assesses for SI/HI/psychosis and overall level of functioning. The therapist inquires about sobriety date and number of community support meetings attended since last session.   The therapist discusses the reason that the 4th Step in AA or NA can be challenging for people. He continues the topic of boredom from the previous group showing a video on anhedonia and discussing how substance use leads to anhedonia by causing the body to reduce dopamine receptors in response to spikes in dopamine.    Recommendations for dealing with anhedonia are socialization, anti-depressants, psychotherapy, consistent sleep, exercise, improved diet, nature therapy, and visualization.   Summary: [name] presents for group rating his depression as a "4" and anxiety as a "7" with no SI or HI.   He says that he last attended a meeting on Monday so has not been to another one since last group. He says that he is trying to find a primary care provider to focus taking better care of his health.  He notes that he, like most of the group members, has had issues with anhedonia since he has stopped using substances. The video today recommends that loved ones of people with anhedonia reach out to them and encourage them to engage in the activities discussed in the video.  [name] says that it was one of his friend's constant nagging that finally got him to attend a meeting which ended up being positive. When the presenter in the video talks about prayer/positive thinking, the therapist asks [name]'s reaction to this as he noted that he was turned off about the talk of God in Twelve Step meetings.  [name] says that he is o.k. with what she says about positive thinking saying that trying to stay positive is not a bad idea.   Progress Towards Goals:  [name] reports no drug or alcohol use since 07/16/22.   UDS collected: No Results: No  AA/NA attended?: No  Sponsor?: No  Myrna Blazer, MA, LCSW, [facility], LCAS 07/28/2022
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
## 2022-07-30 ENCOUNTER — Other Ambulatory Visit (HOSPITAL_COMMUNITY): Payer: Commercial Managed Care - HMO | Attending: Psychiatry | Admitting: Licensed Clinical Social Worker

## 2022-07-30 DIAGNOSIS — F418 Other specified anxiety disorders: Secondary | ICD-10-CM | POA: Diagnosis not present

## 2022-07-30 DIAGNOSIS — F1020 Alcohol dependence, uncomplicated: Secondary | ICD-10-CM

## 2022-07-30 DIAGNOSIS — F1520 Other stimulant dependence, uncomplicated: Secondary | ICD-10-CM

## 2022-07-30 NOTE — Progress Notes (Signed)
Daily Group Progress Note  Program: CD IOP   Group Time: 9 a.m. to 12 p.m.  Type of Therapy: Process and Psychoeducational   Topic: The therapist checks in with group members, assesses for SI/HI/psychosis and overall level of functioning. The therapist inquires about sobriety date and number of community support meetings attended since last session.   The therapist facilitates a discussion on a range of topics including how genetics impacts whether or not a person can tolerate a particular medication and how what medications other family members are taking for the same condition or genetic testing can help zero in on the right medication for a particular person sooner.  The therapist talks about family dynamics of addiction noting that many addicts are either married to another addict or married to an ACOA.   He discusses that people have different ways of coping with stress in that some people are monitors and some are blunters and that there is not one-size-fits all treatment in that a therapist or Sponsor who may be a good fit for one person may be a terrible fit for another. The therapist validates that not everyone gets sober in [place] or NA; however, he does emphasize that research says that recovery takes place in Fellowship meaning that people in recovery need non-using supports and non-using hobbies and activities. The therapist explains the benefits of working the steps emphasizing that they are not designed to have a person wallow in his or her misery but to help the recovering person let go of guilty and shame and identify and remove character defects that can lead to relapse.  The therapist presents Matrix Modules RP 3A Avoiding Relapse Drift and RP 3B Mooring Lines Recovery Chart.   Summary: [name] presents for group rating his depression as a "2" and anxiety as a "7" with no SI or HI.   He says that he is taking Mirtazapine and is off Effexor but believes that he may need another  medication as he was under the impression that the Mirtazapine was initially intended as an adjunct to his Effexor.  The therapist asks if Euel has ever tried Wellbutrin and he says that his mother and sister both take Wellbutrin and do well on it. Thus, the therapist notes that this may then be a medication to consider if needed in the future.  In talking about mooring lines, Travares says that going to the gym daily seems to be helping. He says that things he needs to avoid are social media and talking to his ex. He says that his ex used to complain about his substance use while doing lines off the dashboard of the car so was apparently in denial about her own issues.   Progress Towards Goals: Sandeep reports no drug or alcohol use since 07/16/22.   UDS collected: No Results: No  AA/NA attended?: Yes  Sponsor?: No  Myrna Blazer, MA, LCSW, [facility], LCAS 07/30/2022

## 2022-08-02 ENCOUNTER — Other Ambulatory Visit (HOSPITAL_COMMUNITY): Payer: Commercial Managed Care - HMO | Attending: Medical | Admitting: Licensed Clinical Social Worker

## 2022-08-02 DIAGNOSIS — F419 Anxiety disorder, unspecified: Secondary | ICD-10-CM | POA: Insufficient documentation

## 2022-08-02 DIAGNOSIS — F1020 Alcohol dependence, uncomplicated: Secondary | ICD-10-CM

## 2022-08-02 DIAGNOSIS — F1520 Other stimulant dependence, uncomplicated: Secondary | ICD-10-CM

## 2022-08-02 NOTE — Progress Notes (Signed)
Daily Group Progress Note  Program: CD IOP   Group Time: 9 a.m. to 11:50 a.m.  Type of Therapy: Process and Psychoeducational   Topic: The therapist checks in with group members, assesses for SI/HI/psychosis and overall level of functioning. The therapist inquires about sobriety date and number of community support meetings attended since last session.    The therapist covers Matrix Modules RP 4 Work and Recovery and RP 5 Guilt and Shame.   Summary: [name] presents for group rating his depression as a "0" and anxiety as a "6" with no SI or HI.   He has been to no meetings in part due having his daughter on the weekend but says that he plans on attending one in the future. He started the Baclofen noting that he is now sleeping "like a rock" whereas he was previously on sleeping about 3.5 hours per night so he says that this is a good thing.   He says that some people bring their children to meetings; however, [name] is concerned what his 33 year old daughter will repeat to her mother as she tends to repeat a lot of things.   [name] says that almost all of his co-workers at his current job smoke pot all day during the job; however, he does not have a problem with this as he never liked marijuana and how it made him feel.   In terms of things about which he still feels ashamed, [name] says that he feels ashamed over having picked his daughter up from school in the past after he has been drinking.   He has to leave a few minutes early today having been called in to work to fill in for someone.   Progress Towards Goals: [name] reports no drug or alcohol use since 07/16/22.   UDS collected: Yes Results: No  AA/NA attended?: No  Sponsor?: No  Myrna Blazer, MA, LCSW, [facility], LCAS 08/02/2022

## 2022-08-04 ENCOUNTER — Encounter (HOSPITAL_COMMUNITY): Payer: Self-pay | Admitting: Licensed Clinical Social Worker

## 2022-08-04 ENCOUNTER — Telehealth (HOSPITAL_COMMUNITY): Payer: Commercial Managed Care - HMO | Admitting: Psychiatry

## 2022-08-04 ENCOUNTER — Other Ambulatory Visit (HOSPITAL_COMMUNITY): Payer: Commercial Managed Care - HMO | Attending: Medical | Admitting: Licensed Clinical Social Worker

## 2022-08-04 DIAGNOSIS — F1020 Alcohol dependence, uncomplicated: Secondary | ICD-10-CM | POA: Insufficient documentation

## 2022-08-04 DIAGNOSIS — F410 Panic disorder [episodic paroxysmal anxiety] without agoraphobia: Secondary | ICD-10-CM | POA: Diagnosis not present

## 2022-08-04 DIAGNOSIS — F332 Major depressive disorder, recurrent severe without psychotic features: Secondary | ICD-10-CM

## 2022-08-04 DIAGNOSIS — E781 Pure hyperglyceridemia: Secondary | ICD-10-CM

## 2022-08-04 DIAGNOSIS — E782 Mixed hyperlipidemia: Secondary | ICD-10-CM

## 2022-08-04 DIAGNOSIS — I10 Essential (primary) hypertension: Secondary | ICD-10-CM

## 2022-08-04 DIAGNOSIS — F17290 Nicotine dependence, other tobacco product, uncomplicated: Secondary | ICD-10-CM

## 2022-08-04 DIAGNOSIS — F341 Dysthymic disorder: Secondary | ICD-10-CM | POA: Diagnosis not present

## 2022-08-04 DIAGNOSIS — J301 Allergic rhinitis due to pollen: Secondary | ICD-10-CM

## 2022-08-04 DIAGNOSIS — F1520 Other stimulant dependence, uncomplicated: Secondary | ICD-10-CM

## 2022-08-04 DIAGNOSIS — F17200 Nicotine dependence, unspecified, uncomplicated: Secondary | ICD-10-CM | POA: Insufficient documentation

## 2022-08-04 DIAGNOSIS — Z9151 Personal history of suicidal behavior: Secondary | ICD-10-CM

## 2022-08-04 DIAGNOSIS — F19988 Other psychoactive substance use, unspecified with other psychoactive substance-induced disorder: Secondary | ICD-10-CM

## 2022-08-04 DIAGNOSIS — Z818 Family history of other mental and behavioral disorders: Secondary | ICD-10-CM

## 2022-08-04 DIAGNOSIS — F1121 Opioid dependence, in remission: Secondary | ICD-10-CM

## 2022-08-04 DIAGNOSIS — F419 Anxiety disorder, unspecified: Secondary | ICD-10-CM

## 2022-08-04 DIAGNOSIS — Z811 Family history of alcohol abuse and dependence: Secondary | ICD-10-CM

## 2022-08-04 DIAGNOSIS — K7010 Alcoholic hepatitis without ascites: Secondary | ICD-10-CM

## 2022-08-04 MED ORDER — TRAZODONE HCL 50 MG PO TABS: 50.0000 mg | ORAL_TABLET | Freq: Every evening | ORAL | 1 refills | Status: DC | PRN

## 2022-08-04 MED ORDER — BUSPIRONE HCL 10 MG PO TABS: 10.0000 mg | ORAL_TABLET | Freq: Three times a day (TID) | ORAL | 0 refills | Status: DC

## 2022-08-04 NOTE — Progress Notes (Signed)
Daily Group Progress Note  Program: CD IOP   Group Time: 9 a.m. to 12 p.m.  Type of Therapy: Process and Psychoeducational   Topic: The therapist checks in with group members, assesses for SI/HI/psychosis and overall level of functioning. The therapist inquires about sobriety date and number of community support meetings attended since last session.    The therapist shows a recovery video dealing with guilt and shame and embarrassment and humiliation. The therapist talks about the differences between each and facilitates a discussion on how shame can become part of the addiction relapse cycle and how shame over being a person with addiction is not congruent with the fact that addiction is a brain-based disease and not a character defect.  The therapist also shows a video aimed at illustrating that if group members had chosen a different path that their lives would likely have had new or different positives; however, new and different problems and challenges as well. The therapist discusses the saying, "You are exactly where your Higher Power wants you to be."    Summary: Sarkis presents for group rating his depression as a "0" and anxiety as a "6" with no SI or HI.   He says that he needs to see the P.A. to get a refill. He notes that his anxiety is flaring up as he is leaving his parents and going back home on Friday where he will be alone. He does note that his mother has made it clear that he can stay longer or return if needed and that his parents live on 15 minutes away. The therapist agrees that Paulmichael does not need a lot of unstructured alone time reminding him of the daily schedules in the group room and encouraging him to attend more meetings and get numbers to call as well if needed.   He says that he attended a meeting since his last group with his mother's best friend who has 5-6 years of sobriety saying that it was a little weird.  He says that it helps to realize that if he had  made different choices that his life would also have different problems as well meaning that it would not necessarily be better.   Progress Towards Goals: Natan reports no drug or alcohol use since 07/16/22.   UDS collected: No Results: No  AA/NA attended?: Yes  Sponsor?: No  Myrna Blazer, MA, LCSW, [facility], LCAS 08/04/2022

## 2022-08-04 NOTE — Progress Notes (Signed)
   [facility] Follow-up Outpatient CDIOP Date: 08/04/2022  Admission Date:07/28/2022  Sobriety date:07/16/2022  Subjective: " I'm  still very anxious" HPI: Pt is now 1 week into CD IOP and experiencing significant anxiety. His mother whose father (MGF) died of chronic alcoholism suffers similarly. He did try Buspar in past but was told it took time for drug to work ? Doesn't remember much about it. He is cuurently on Effexor 75 mg ER for his depression/dysthymia and Remeron 30 mg HS for sleep. He is able so far to manage his cravings.   Review of Systems: Psychiatric: Agitation: + severe anxiety Hallucination: No Depressed MoodJustin presents for group rating his depression as a "0" and anxiety as a "6" with no SI or HI.  Insomnia: Rx Remeron Hypersomnia: No Altered Concentration: No Feels Worthless: Not consciously Grandiose Ideas: No Belief In Special Powers: No New/Increased Substance Abuse: No Compulsions:Alchol cravings  Neurologic: Headache: No Seizure: No Paresthesias: No  Current Medications: baclofen 10 MG tablet Commonly known as: LIORESAL Take 1 tablet (10 mg total) by mouth 3 (three) times daily.  busPIRone 10 MG tablet Commonly known as: BUSPAR Take 1 tablet (10 mg total) by mouth 3 (three) times daily for 90 doses.  fluticasone 50 MCG/ACT nasal spray Commonly known as: FLONASE Place 1 spray into both nostrils 2 (two) times daily as needed for allergies.  hydrOXYzine 25 MG tablet Commonly known as: ATARAX Take 1 tablet (25 mg total) by mouth every 8 (eight) hours as needed for anxiety.  lisinopril 20 MG tablet Commonly known as: ZESTRIL Take 1 tablet (20 mg total) by mouth daily.  mirtazapine 30 MG tablet Commonly known as: Remeron Take 1 tablet (30 mg total) by mouth at bedtime.  venlafaxine XR 75 MG 24 hr capsule Commonly known as: Effexor XR Take 1 capsule (75 mg total) by mouth daily.    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, severe, in sustained remission, dependence (HCC) Early onset dysthymia Chronic anxiety Panic anxiety syndrome 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 FU 1 week  [name], [name] Patient ID: [name], male   DOB: 06-Nov-1991, 31 y.o.   MRN: [id number]

## 2022-08-06 ENCOUNTER — Other Ambulatory Visit (HOSPITAL_COMMUNITY): Payer: Commercial Managed Care - HMO | Attending: Psychiatry | Admitting: Licensed Clinical Social Worker

## 2022-08-06 DIAGNOSIS — F1010 Alcohol abuse, uncomplicated: Secondary | ICD-10-CM | POA: Insufficient documentation

## 2022-08-06 DIAGNOSIS — F419 Anxiety disorder, unspecified: Secondary | ICD-10-CM | POA: Diagnosis not present

## 2022-08-06 DIAGNOSIS — F1020 Alcohol dependence, uncomplicated: Secondary | ICD-10-CM

## 2022-08-06 DIAGNOSIS — F1520 Other stimulant dependence, uncomplicated: Secondary | ICD-10-CM

## 2022-08-06 DIAGNOSIS — R45851 Suicidal ideations: Secondary | ICD-10-CM | POA: Diagnosis present

## 2022-08-06 NOTE — Progress Notes (Signed)
Daily Group Progress Note  Program: CD IOP   Group Time: 9 a.m. to 11:15 a.m.  Type of Therapy: Process and Psychoeducational   Topic: The therapist checks in with group members, assesses for SI/HI/psychosis and overall level of functioning. The therapist inquires about sobriety date and number of community support meetings attended since last session.    The therapist show a video on shame and guilt discussing the need for self-compassion and the need to reach out versus act out.    The therapist begins Matrix Module PR 6 Staying Busy.    Summary: Edi presents for group rating his depression as a "0" and anxiety as a "5" with no SI or HI.   He has not attended a meeting but plans on attending one tonight. He says that he worked 11 hours yesterday and that if he works today that it will be 7 straight days. He ends up having to leave group early today due to being called into work by his boss.   He says that a person contacted him to let him know that Shahil's ex was going into her bartending job drunk and was drunk by 3 p.m. She also told him that she worked drug regularly. Gabriel Rung says that his 48 year old daughter has indicated that her mom was drinking beer when she was with her; however, Brinson has nothing to confirm that she is getting drunk when with their daughter who she drops off a school for [place] to eventually pick up.  Kito expresses anger at his ex's hypocrisy as she focused on his substance use after he experienced some consequences and it is in their custody paperwork regarding both parties.   The therapist suggests that Emanuelle's ex will certainly experience consequences from her use if she continues on this trajectory. He notes that she has driven drunk and done damage to her car in the past; however, she has never been arrested.  The therapist suggests that Cortavious could take his concerns about his ex's substance use to his attorney and if he were to find out that his ex  were impaired while keeping their daughter that he could make a report to DSS.   Progress Towards Goals: Jayin reports no drug or alcohol use since 07/16/22.   UDS collected: No Results: Yes; negative for drugs and alcohol  AA/NA attended?: No  Sponsor?: No  Myrna Blazer, MA, LCSW, [facility], LCAS 08/06/2022

## 2022-08-11 ENCOUNTER — Encounter (HOSPITAL_COMMUNITY): Payer: Commercial Managed Care - HMO

## 2022-08-11 ENCOUNTER — Telehealth (HOSPITAL_COMMUNITY): Payer: Self-pay | Admitting: Licensed Clinical Social Worker

## 2022-08-11 NOTE — Telephone Encounter (Signed)
The therapist receives the following email from [place] on 08/11/22:  "Hey bill! Sorry to reach out like this but I couldn't get through to anything at the [facility] number. I'm currently out of state, we had a move to [place] today and we thought we'd be driving back tonight, but boss wants [place] to wait til the morning because we have a couple hours of work left and he doesn't want [place] driving through the night. Meaning I'll miss tomorrows meeting but I will be back on Friday. Sorry for the late notice, we had all intentions of getting back but it was a bigger job than we anticipated. Thanks!"   The therapist sends a completely new email to [email or web address] with the following:  "Good morning,  Thanks for letting me know and safe travels!  P.S. My direct contact number is [phone] so you do not have to try and get through the main number.  Regards,"  Myrna Blazer, MA, LCSW, [facility], LCAS 08/11/2022

## 2022-08-12 ENCOUNTER — Encounter (HOSPITAL_COMMUNITY): Payer: Self-pay | Admitting: Licensed Clinical Social Worker

## 2022-08-13 ENCOUNTER — Ambulatory Visit: Payer: 59 | Attending: Internal Medicine | Admitting: Internal Medicine

## 2022-08-13 ENCOUNTER — Other Ambulatory Visit (HOSPITAL_COMMUNITY): Payer: Commercial Managed Care - HMO | Attending: Medical | Admitting: Licensed Clinical Social Worker

## 2022-08-13 ENCOUNTER — Encounter: Payer: Self-pay | Admitting: Internal Medicine

## 2022-08-13 ENCOUNTER — Encounter (HOSPITAL_COMMUNITY): Payer: Self-pay | Admitting: Medical

## 2022-08-13 VITALS — BP 98/69 | HR 98 | Temp 98.7°F | Ht 73.0 in | Wt 215.6 lb

## 2022-08-13 DIAGNOSIS — F341 Dysthymic disorder: Secondary | ICD-10-CM | POA: Insufficient documentation

## 2022-08-13 DIAGNOSIS — F1021 Alcohol dependence, in remission: Secondary | ICD-10-CM

## 2022-08-13 DIAGNOSIS — F1020 Alcohol dependence, uncomplicated: Secondary | ICD-10-CM | POA: Insufficient documentation

## 2022-08-13 DIAGNOSIS — R7989 Other specified abnormal findings of blood chemistry: Secondary | ICD-10-CM

## 2022-08-13 DIAGNOSIS — G909 Disorder of the autonomic nervous system, unspecified: Secondary | ICD-10-CM | POA: Diagnosis not present

## 2022-08-13 DIAGNOSIS — F1520 Other stimulant dependence, uncomplicated: Secondary | ICD-10-CM | POA: Insufficient documentation

## 2022-08-13 DIAGNOSIS — Z9151 Personal history of suicidal behavior: Secondary | ICD-10-CM | POA: Diagnosis not present

## 2022-08-13 DIAGNOSIS — E782 Mixed hyperlipidemia: Secondary | ICD-10-CM

## 2022-08-13 DIAGNOSIS — K7010 Alcoholic hepatitis without ascites: Secondary | ICD-10-CM | POA: Diagnosis not present

## 2022-08-13 DIAGNOSIS — Z811 Family history of alcohol abuse and dependence: Secondary | ICD-10-CM | POA: Diagnosis not present

## 2022-08-13 DIAGNOSIS — F17290 Nicotine dependence, other tobacco product, uncomplicated: Secondary | ICD-10-CM | POA: Insufficient documentation

## 2022-08-13 DIAGNOSIS — F1121 Opioid dependence, in remission: Secondary | ICD-10-CM | POA: Insufficient documentation

## 2022-08-13 DIAGNOSIS — F419 Anxiety disorder, unspecified: Secondary | ICD-10-CM | POA: Diagnosis not present

## 2022-08-13 DIAGNOSIS — Z7689 Persons encountering health services in other specified circumstances: Secondary | ICD-10-CM

## 2022-08-13 DIAGNOSIS — Z818 Family history of other mental and behavioral disorders: Secondary | ICD-10-CM | POA: Insufficient documentation

## 2022-08-13 DIAGNOSIS — F410 Panic disorder [episodic paroxysmal anxiety] without agoraphobia: Secondary | ICD-10-CM | POA: Diagnosis not present

## 2022-08-13 DIAGNOSIS — J301 Allergic rhinitis due to pollen: Secondary | ICD-10-CM | POA: Insufficient documentation

## 2022-08-13 DIAGNOSIS — F19988 Other psychoactive substance use, unspecified with other psychoactive substance-induced disorder: Secondary | ICD-10-CM | POA: Diagnosis not present

## 2022-08-13 DIAGNOSIS — Z79899 Other long term (current) drug therapy: Secondary | ICD-10-CM | POA: Diagnosis not present

## 2022-08-13 DIAGNOSIS — I10 Essential (primary) hypertension: Secondary | ICD-10-CM

## 2022-08-13 DIAGNOSIS — E781 Pure hyperglyceridemia: Secondary | ICD-10-CM | POA: Insufficient documentation

## 2022-08-13 DIAGNOSIS — Z23 Encounter for immunization: Secondary | ICD-10-CM

## 2022-08-13 MED ORDER — LISINOPRIL 20 MG PO TABS: 20.0000 mg | ORAL_TABLET | Freq: Every day | ORAL | 0 refills | Status: DC

## 2022-08-13 NOTE — Progress Notes (Signed)
Daily Group Progress Note  Program: CD IOP   Group Time: 9 a.m. to 12 p.m.  Type of Therapy: Process and Psychoeducational   Topic: The therapist checks in with group members, assesses for SI/HI/psychosis and overall level of functioning. The therapist inquires about sobriety date and number of community support meetings attended since last session.    The therapist has the two group members who were not present on Wednesday to introduce themselves to the two new group members who started that day and vice versa.  The therapist educates the new members on the different types of Twelve Step meetings as do other group members. The therapist focuses on why marijuana use is considered no different than alcohol or any other drug use showing a video that illustrates that the distinction between a soft and hard drug is a myth. The therapist also discusses the reason that people in recovery should avoid any substance that activates the addictive pathway in the brain.   The therapist shows a group a video on the importance of honesty at the conclusion of group eliciting feedback from members.    Summary: [name] presents for group rating his depression as a "0" and anxiety as a "8 with no SI or HI.   He attended a meeting last Friday. He shares his story with new group members and notes that he now has 30 days of sobriety. He is encouraged to pick up a 30 day chip by another member.   Harden emphasizes that his daughter is a motivation for his recovery. At the end of group, he asks the therapist if he can get some documentation of his attendance to this group when his Court date approaches around October of this year with the therapist answering in the affirmative.   Progress Towards Goals: Shivaay reports no drug or alcohol use since 07/16/22.   UDS collected: Yes Results: No  AA/NA attended?: Yes  Sponsor?: No  Myrna Blazer, MA, LCSW, [facility], LCAS 08/13/2022

## 2022-08-13 NOTE — Progress Notes (Signed)
Patient ID: MARCKUS HANOVER, male    DOB: [birth year]  MRN: [id number]  CC: Hospitalization Follow-up   Subjective: [name] is a 31 y.o. male who presents for new pt hosp f/u new patient visit His concerns today include:  Patient with history of anxiety/depression, EtOH use disorder, EtOH withdrawal seizure at the age of 17, HTN,  Patient admitted to the behavioral health hospital 8/10-16/2023 for management of alcohol detox and alcohol-induced mood disorder.  He was placed on Ativan protocol.  Placed on lisinopril for elevated blood pressure.  Noted to have mild elevation in AST of 49. He was discharged home on hydroxyzine, lisinopril, Remeron, trazodone and nicotine patches.  Today: He has remained free of alcohol since going through detox.  He is now engaged in an intensive outpatient group program 3 times a week.  Currently on BuSpar and Remeron and doing okay on these medications.  Followed by behavioral health physician assistant Maryjean Morn.  Saw him today.  HTN: Reports blood pressure was elevated even before hospitalization.  Reports compliance with lisinopril.  Feels his blood pressure has come down nicely since he has stopped drinking.  No device to check blood pressure but donates plasma 2 times a week where blood pressure is checked.  Range has been 115-125/70-80.  Abnormal LFT: As mentioned above his AST was mildly elevated.  Reports having had hepatitis C screening at the plasma center.  Recent lipid profile revealed total cholesterol of 209 with triglyceride of 385.  Patient feels he does well with his eating habits.  He eats a lot of fruits.  Need to do better with getting in more vegetables.  He drinks mainly water.  His job is very active.  He works for a [employer] and does a lot of lifting.  Patient Active Problem List   Diagnosis Date Noted   Allergic rhinitis 07/02/2022   Chronic alcohol abuse 07/02/2022   Alcohol withdrawal (HCC) 07/01/2022   Alcohol  dependence with alcohol-induced mood disorder (HCC)    Generalized anxiety disorder 09/07/2021   Mild depression 09/07/2021     Current Outpatient Medications on File Prior to Visit  Medication Sig Dispense Refill   baclofen (LIORESAL) 10 MG tablet Take 1 tablet (10 mg total) by mouth 3 (three) times daily. 90 tablet 1   busPIRone (BUSPAR) 10 MG tablet Take 1 tablet (10 mg total) by mouth 3 (three) times daily for 90 doses. 90 tablet 0   fluticasone (FLONASE) 50 MCG/ACT nasal spray Place 1 spray into both nostrils 2 (two) times daily as needed for allergies.     lisinopril (ZESTRIL) 20 MG tablet Take 1 tablet (20 mg total) by mouth daily. 30 tablet 0   mirtazapine (REMERON) 30 MG tablet Take 1 tablet (30 mg total) by mouth at bedtime. 30 tablet 0   hydrOXYzine (ATARAX) 25 MG tablet Take 1 tablet (25 mg total) by mouth every 8 (eight) hours as needed for anxiety. (Patient not taking: Reported on 08/13/2022) 60 tablet 0   venlafaxine XR (EFFEXOR XR) 75 MG 24 hr capsule Take 1 capsule (75 mg total) by mouth daily. (Patient not taking: Reported on 08/13/2022) 30 capsule 2   No current facility-administered medications on file prior to visit.    Allergies  Allergen Reactions   Benadryl [Diphenhydramine] Anxiety and Other (See Comments)    Increased anxiety   Codeine Nausea And Vomiting and Rash    Social History   Socioeconomic History   Marital status: Single  Spouse name: Not on file   Number of children: 1   Years of education: Not on file   Highest education level: Not on file  Occupational History   Not on file  Tobacco Use   Smoking status: Never   Smokeless tobacco: Never  Vaping Use   Vaping Use: Every day   Substances: Nicotine, Flavoring  Substance and Sexual Activity   Alcohol use: Yes    Alcohol/week: 70.0 standard drinks of alcohol    Types: 70 Standard drinks or equivalent per week   Drug use: Not Currently    Types: Benzodiazepines, Cocaine   Sexual activity:  Yes  Other Topics Concern   Not on file  Social History Narrative   Not on file   Social Determinants of Health   Financial Resource Strain: Not on file  Food Insecurity: Not on file  Transportation Needs: Not on file  Physical Activity: Not on file  Stress: Not on file  Social Connections: Not on file  Intimate Partner Violence: Not on file    Family History  Problem Relation Age of Onset   Lupus Mother    Hypertension Father     No past surgical history on file.  ROS: Review of Systems Negative except as stated above  PHYSICAL EXAM: BP 98/69   Pulse 98   Temp 98.7 F (37.1 C) (Oral)   Ht 6\' 1"  (1.854 m)   Wt 215 lb 9.6 oz (97.8 kg)   SpO2 98%   BMI 28.44 kg/m   Physical Exam   General appearance - alert, well appearing, young Caucasian male and in no distress Neck - supple, no significant adenopathy Chest - clear to auscultation, no wheezes, rales or rhonchi, symmetric air entry Heart - normal rate, regular rhythm, normal S1, S2, no murmurs, rubs, clicks or gallops Extremities - peripheral pulses normal, no pedal edema, no clubbing or cyanosis     Latest Ref Rng & Units 07/16/2022    6:59 AM 07/04/2022    4:48 AM 07/03/2022    5:28 AM  CMP  Glucose 70 - 99 mg/dL 86  07/05/2022  98   BUN 6 - 20 mg/dL 16  9  14    Creatinine 0.61 - 1.24 mg/dL 371   0.62   Sodium 135 - 145 mmol/L 143  139  144   Potassium 3.5 - 5.1 mmol/L 4.1  3.6  3.6   Chloride 98 - 111 mmol/L 108  104  112   CO2 22 - 32 mmol/L 29  25  25    Calcium 8.9 - 10.3 mg/dL 9.0  8.8  8.8   Total Protein 6.5 - 8.1 g/dL 6.5 - 8.1 g/dL 7.3    7.4  6.3  6.3   Total Bilirubin 0.3 - 1.2 mg/dL 0.3 - 1.2 mg/dL 0.6    0.9  1.0  1.0   Alkaline Phos 38 - 126 U/L 38 - 126 U/L 48    49  48  55   AST 15 - 41 U/L 15 - 41 U/L 49    49  36  58   ALT 0 - 44 U/L 0 - 44 U/L 39    39  32  40    Lipid Panel     Component Value Date/Time   CHOL 209 (H) 07/16/2022 0659   TRIG 385 (H) 07/16/2022 0659    HDL 59 07/16/2022 0659   CHOLHDL 3.5 07/16/2022 0659   VLDL 77 (H) 07/16/2022 09/15/2022  LDLCALC 73 07/16/2022 0659    CBC    Component Value Date/Time   WBC 5.3 07/16/2022 0659   RBC 4.97 07/16/2022 0659   HGB 14.8 07/16/2022 0659   HCT 44.8 07/16/2022 0659   PLT 220 07/16/2022 0659   MCV 90.1 07/16/2022 0659   MCH 29.8 07/16/2022 0659   MCHC 33.0 07/16/2022 0659   RDW 12.6 07/16/2022 0659   LYMPHSABS 2.7 07/02/2022 0330   MONOABS 0.5 07/02/2022 0330   EOSABS 0.1 07/02/2022 0330   BASOSABS 0.0 07/02/2022 0330    ASSESSMENT AND PLAN: 1. Establishing care with new doctor, encounter for   2. Essential hypertension Reported ambulatory blood pressure readings are good.  Continue lisinopril.  DASH diet discussed and encouraged. - lisinopril (ZESTRIL) 20 MG tablet; Take 1 tablet (20 mg total) by mouth daily.  Dispense: 90 tablet; Refill: 0  3. Alcohol use disorder, severe, in early remission (HCC) Commended him on quitting.  Encouraged him to remain free of alcohol.  He will continue his outpatient treatment group.  4. Mixed hyperlipidemia Dietary counseling given. Patient advised to eliminate sugary drinks from the diet, cut back on portion sizes especially of white carbohydrates, eat more white lean meat like chicken [place] and seafood instead of beef or pork and incorporate fresh fruits and vegetables into the diet daily.  5. Abnormal LFTs Most likely due to EtOH which he has quit.  AST was only mildly elevated.  He has been screened for hepatitis C at the plasma center where he donates blood products.  6. Need for immunization against influenza - Flu Vaccine QUAD 60mo+IM (Fluarix, Fluzone & Alfiuria Quad PF)    Patient was given the opportunity to ask questions.  Patient verbalized understanding of the plan and was able to repeat key elements of the plan.   This documentation was completed using Paediatric nurse.  Any transcriptional errors are  unintentional.  No orders of the defined types were placed in this encounter.    Requested Prescriptions    No prescriptions requested or ordered in this encounter    No follow-ups on file.  Jonah Blue, MD, FACP

## 2022-08-13 NOTE — Progress Notes (Addendum)
   [facility] Follow-up Outpatient CDIOP Date: 08/13/2022  Maryjean Morn, PA-C Patient ID: [name], male   DOB: 1991/11/21, 31 y.o.   MRN: [id number]  Brief encounter outside Group room tolerating Buspar with improvement in anxiety. FU next week

## 2022-08-16 ENCOUNTER — Other Ambulatory Visit (HOSPITAL_COMMUNITY): Payer: Self-pay | Admitting: Medical

## 2022-08-16 ENCOUNTER — Other Ambulatory Visit (HOSPITAL_COMMUNITY): Payer: Commercial Managed Care - HMO | Attending: Psychiatry | Admitting: Licensed Clinical Social Worker

## 2022-08-16 DIAGNOSIS — F1520 Other stimulant dependence, uncomplicated: Secondary | ICD-10-CM | POA: Insufficient documentation

## 2022-08-16 DIAGNOSIS — F1020 Alcohol dependence, uncomplicated: Secondary | ICD-10-CM | POA: Insufficient documentation

## 2022-08-16 MED ORDER — HYDROXYZINE PAMOATE 25 MG PO CAPS: 25.0000 mg | ORAL_CAPSULE | Freq: Three times a day (TID) | ORAL | 2 refills | Status: DC | PRN

## 2022-08-16 MED ORDER — BACLOFEN 10 MG PO TABS: 10.0000 mg | ORAL_TABLET | Freq: Three times a day (TID) | ORAL | 2 refills | Status: DC

## 2022-08-16 NOTE — Progress Notes (Signed)
Daily Group Progress Note  Program: CD IOP   Group Time: 9 a.m. to 12 p.m.  Type of Therapy: Process and Psychoeducational   Topic: The therapist checks in with group members, assesses for SI/HI/psychosis and overall level of functioning. The therapist inquires about sobriety date and number of community support meetings attended since last session.    The therapist talks to the group members about the reason that males have male Sponsors and females have male Sponsors and why males give out their numbers to males on and the same for male members of AA and NA. The therapist explains what is meant by the 13th Step and the reason that newcomers and encouraged to not get into romantic relationships with each other.   The therapist explains the reason that using any illicit substance calls for a new sobriety date and the reason that group members are asked to abstain from all addictive substances at least while attending SA IOP.   The therapist focuses a great deal of time regarding how difficult it is to give up relationships with substance using friends and/or family and how difficult it is to start new relationships while not using substances but how doing so is vitally important to one's recovery.   The therapist completes Matrix module RP 8 on Honesty and shows a short video on the ten most common lies that a person in addiction tells him or herself.     Summary: Sanjith presents for group rating his depression as a "0" and anxiety as a "7 with no SI or HI.   He says that he believes that his Buspar is making his anxiety possibly worse and causing him to get a feeling like he is having electric shocks so he meets with the P.A. today.  Hussein responds to another group member who is struggling with social interactions at Twelve Step meetings by validating that he feels the same way and that hearing someone else say it makes him feel less alone.   He attended a meeting on Friday but says  that he will likely need to attend a different meeting to find a Sponsor as each time he attends this meeting only two women raise their hands to volunteer to be Sponsors. Maclovio and other group members in attendance today seem to not know that Sponsors should be the same sex as their Sponsees.   During the discussion on honesty, Roxas gives examples of different times in which he was dishonest when not using. He notes that although he has not been using that he made up an excuse recently for why he did not do something with a male friend and acquaintance when he could have just told her that he did not feel like doing it.   Progress Towards Goals: Son reports no drug or alcohol use since 07/16/22.   UDS collected: Yes Results: No  AA/NA attended?: Yes  Sponsor?: No  Myrna Blazer, MA, LCSW, [facility], LCAS 08/16/2022

## 2022-08-16 NOTE — Progress Notes (Signed)
   [facility] Follow-up Outpatient CDIOP Date: 08/16/2022  Admission Date: 07/28/2022  Sobriety date: 07/16/2022  Subjective: " I don't know.Marland Kitchenibuprofen have started to have worse anxiety and headaches-I stopped Buspar and that helped"  HPI: [name] is seen for FU after he began to have untoward reaction to Buspar as noted above.He has a history of anxiety like his mother who was raised in a dysfunctional alcoholic home. Both he and his mom are reading suggested books regarding growing up in an alcoholic home. Initially [name] had a positive response to Buspar but after about 5 days he began to experience dysphoria/increasing anxiety and headaches he stopped the medication and started to feel better. He is going to return to Vistaril for anxiety. He will use Breathing and CBT traininmg as well He continues to take his other medications . Baclofen is helping with cravings Now 1 month abstinent  Review of Systems: Psychiatric: Agitation: Severe anxiety Hallucination: No Depressed Mood: [name] presents for group rating his depression as a "0" and anxiety as a "7 with no SI or HI.  Insomnia: Rx Remeron Hypersomnia: No Altered Concentration: No Feels Worthless: Chronic self esteem issues related to alcoholism Grandiose Ideas: No Belief In Special Powers: No New/Increased Substance Abuse: No Compulsions: Rx Baclofen for cravings  Neurologic: Headache: Per HPI Seizure: No Paresthesias: No  Current Medications: hydrOXYzine (VISTARIL) 25 MG capsule baclofen (LIORESAL) 10 MG tablet  fluticasone (FLONASE) 50 MCG/ACT nasal spray  lisinopril (ZESTRIL) 20 MG tablet  mirtazapine (REMERON) 30 MG tablet   Mental Status Examination  Appearance:Casual Alert: Yes Attention: good  Cooperative: Yes Eye Contact: Good Speech: Clear and coherent Psychomotor Activity: Normal Memory/Concentration: Normal/intact Oriented: person, place, time/date and situation Mood:  Euthymic Affect: Appropriate and Congruent Thought Processes and Associations: Coherent and Intact Fund of Knowledge: WDL Thought Content: WDL No HI/SI/ Associations intact Insight: Limited Judgement: Improving  QKM:[id number]  PDMP:No further entries past 06/15/2022 for Librium  Diagnosis:  Alcohol use disorder, severe, dependence (HCC) Severe stimulant use disorder (HCC) Opioid use disorder, severe, in sustained remission, dependence (HCC) Early onset dysthymia Chronic anxiety Panic anxiety syndrome 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 Resume Vistaril FU when back from beach trip ~2 weeks  Jeff Morn, PA-C Patient ID: [name], male   DOB: 03-21-1991, 31 y.o.   MRN: [id number]

## 2022-08-18 ENCOUNTER — Other Ambulatory Visit (HOSPITAL_COMMUNITY): Payer: Commercial Managed Care - HMO | Attending: Medical | Admitting: Licensed Clinical Social Worker

## 2022-08-18 DIAGNOSIS — R45851 Suicidal ideations: Secondary | ICD-10-CM | POA: Insufficient documentation

## 2022-08-18 DIAGNOSIS — F1520 Other stimulant dependence, uncomplicated: Secondary | ICD-10-CM

## 2022-08-18 DIAGNOSIS — F1020 Alcohol dependence, uncomplicated: Secondary | ICD-10-CM

## 2022-08-18 NOTE — Progress Notes (Signed)
Daily Group Progress Note  Program: CD IOP   Group Time: 9 a.m. to 12 p.m.  Type of Therapy: Process and Psychoeducational   Topic: The therapist checks in with group members, assesses for SI/HI/psychosis and overall level of functioning. The therapist inquires about sobriety date and number of community support meetings attended since last session.    In response to issues brought up by group members in open discussion, the therapist talks about how one can go about finding a Sponsor noting that if a Sponsor says "no" to a request from a person to be his or her Sponsor that some people take this as a rejection when often the Sponsor says "no" because he or she already has too many Sponsees or is not taking Sponsees due to a personal issue in his or her life. The therapist says that often the Sponsor saying "no" can recommend another good Sponsor who is taking Sponsees.   The therapist answers group members' questions on MAT educating them about oral Naltrexone versus Vivitrol and Antabuse which is typically no longer used.   The therapist covers Matrix modules RP 9 Total Abstinence, RP 10 Sex and Recovery, and begins RP 11 Anticipating and Preventing Relapse. The therapist presents data which supports that being "[place]" is likely not a good plan. The therapist also shows videos on the stages of relapse focusing on emotional relapse highlighting the need to talk about problematic life circumstances with other people even when the person is not thinking of drinking of using or having cravings. The therapist observes that most people do not feel like they should speak up unless they are in mental relapse and thus thinking about using.     Summary: [name] presents for group rating his depression as a "0" and anxiety as a "5 with no SI or HI.   He did not attend a meeting saying that he is trying to find a couple of new meetings in the hope of attending one in which he can find people  taking new Sponsees.   He says that he has been off the Buspar for a few days and is feeling better. He talks about some information that his 32 year old shared about his ex and a camping trip that she took that leads him to believe that she is still abusing substances noting that he realizes from group that she will eventually experiences consequences which she has thus far avoided.  He says that their "papers" run out at the end of the month which are apparently 50-Bs that they took out on each other so he is wondering if she will call or show up at his house.   Another group members asks if [name] could possibly get sole custody which [name] saying that he does not have the finances to pursue this.   Progress Towards Goals: Clearance reports no drug or alcohol use.   UDS collected: No Results: Yes; negative for drugs and alcohol.   AA/NA attended?: No  Sponsor?: No  [name], [place], [place], [facility], LCAS 08/18/2022

## 2022-08-20 ENCOUNTER — Other Ambulatory Visit (HOSPITAL_COMMUNITY): Payer: Commercial Managed Care - HMO | Attending: Psychiatry | Admitting: Licensed Clinical Social Worker

## 2022-08-20 DIAGNOSIS — F419 Anxiety disorder, unspecified: Secondary | ICD-10-CM | POA: Insufficient documentation

## 2022-08-20 DIAGNOSIS — F1520 Other stimulant dependence, uncomplicated: Secondary | ICD-10-CM

## 2022-08-20 DIAGNOSIS — F1020 Alcohol dependence, uncomplicated: Secondary | ICD-10-CM

## 2022-08-20 NOTE — Progress Notes (Signed)
Daily Group Progress Note  Program: CD IOP   Group Time: 9 a.m. to 12 p.m.  Type of Therapy: Process and Psychoeducational   Topic: The therapist checks in with group members, assesses for SI/HI/psychosis and overall level of functioning. The therapist inquires about sobriety date and number of community support meetings attended since last session.    The therapist facilitates a discussion regarding a number of topics including using dreams, sugar cravings in early recovery, and the need to put as much energy into one's recovery as was put into drinking and/or getting high. The therapist how to deal with toxic parents and/or family members.   The therapist continues presenting information from RP 11 Anticipating and Preventing Relapse focusing mostly on emotional buildup. The therapist has group members review the emotion wheel and talks about the reason that it is important for people in early recovery to begin to name the emotions they are feeling and to use mindfulness skills to begin paying attention to their emotional state.    Summary: Radford presents for group rating his depression as a "0" and anxiety as a "5 with no SI or HI.   He says that he is going to attend a meeting tonight. He will be at the beach next week at [facility] and the therapist helps him to locate some meetings near where he will be staying.  Deaire, like several others in the group, admits to having had using dreams noting that he is always relieved to realize that it was just a dream.  He says that drinking does not cross his mind much as a result of the Baclofen he is taking. He talks about how towards the end that he was drinking to not get sick or to just feel normal versus getting any sort of high from it.   In discussing emotional buildup and the "sexual frustration" noted in the module, Rasul talks about the difficulties involved in going on a date without first having a couple of drinks and the challenge  of having sex sober as alcohol helped to reduce anxiety in both circumstances.   Progress Towards Goals: Devrin reports no drug or alcohol use.   UDS collected: No Results: Yes; negative for drugs and alcohol.   AA/NA attended?: No  Sponsor?: No  Myrna Blazer, [place], [place], [facility], LCAS 08/23/2022

## 2022-08-23 ENCOUNTER — Encounter (HOSPITAL_COMMUNITY): Payer: Commercial Managed Care - HMO

## 2022-08-25 ENCOUNTER — Encounter (HOSPITAL_COMMUNITY): Payer: Commercial Managed Care - HMO

## 2022-08-27 ENCOUNTER — Encounter (HOSPITAL_COMMUNITY): Payer: Commercial Managed Care - HMO

## 2022-08-30 ENCOUNTER — Telehealth (HOSPITAL_COMMUNITY): Payer: Self-pay | Admitting: Licensed Clinical Social Worker

## 2022-08-30 ENCOUNTER — Other Ambulatory Visit (HOSPITAL_COMMUNITY): Payer: Self-pay | Admitting: Medical

## 2022-08-30 ENCOUNTER — Encounter (HOSPITAL_COMMUNITY): Payer: Commercial Managed Care - HMO

## 2022-08-30 NOTE — Telephone Encounter (Signed)
As [name] did not return to group today, the therapist attempts to reach him leaving a HIPAA-compliant voicemail.  [name], [place], LCSW, [facility], [place] 08/30/2022

## 2022-08-31 ENCOUNTER — Telehealth (HOSPITAL_COMMUNITY): Payer: Self-pay | Admitting: Licensed Clinical Social Worker

## 2022-08-31 NOTE — Telephone Encounter (Signed)
The therapist receives the following email from [place] today:  "[name], sorry for the lack of communication, things have been hectic since I got back Sunday. I have a scheduling app for work, that when I started the program I was able to put in the unavailable slots for the meetings, but I didn't set it up post my beach trip so he's had me on every day this week until Friday. I tried to get coverage Monday but didn't have enough time, and I'm working on trying to get someone to cover for me tomorrow but if I can't I will definitely be back Friday if that's alright.  I also wanted to meet with you about setting up some 1 on 1 sessions if possible."  The therapist attempts to reach him by phone leaving a HIPAA-compliant voicemail.    [name], [place], LCSW, [facility], [place] 08/31/2022

## 2022-09-02 NOTE — Telephone Encounter (Signed)
Pt intolerant

## 2022-09-03 ENCOUNTER — Other Ambulatory Visit (HOSPITAL_COMMUNITY): Payer: Commercial Managed Care - HMO | Attending: Psychiatry | Admitting: Licensed Clinical Social Worker

## 2022-09-03 DIAGNOSIS — F1020 Alcohol dependence, uncomplicated: Secondary | ICD-10-CM | POA: Insufficient documentation

## 2022-09-03 DIAGNOSIS — F1520 Other stimulant dependence, uncomplicated: Secondary | ICD-10-CM | POA: Diagnosis not present

## 2022-09-03 NOTE — Progress Notes (Signed)
Daily Group Progress Note  Program: CD IOP   Group Time: 9 a.m. to 12 p.m.  Type of Therapy: Process and Psychoeducational   Topic: The therapist checks in with group members, assesses for SI/HI/psychosis and overall level of functioning. The therapist inquires about sobriety date and number of community support meetings attended since last session.   The therapist discusses the importance of assertiveness in relation to self-care which is critical to recovery. The therapist presents Matrix Module RP 14 Defining Spirituality. As group member struggle to come up with specific versus subjective answers to the questions in the module, the therapist provides them the questions as homework to work on over the weekend. He has them add whether or not they have a spiritual practice that gives them hope and meaning and purpose and if so, how often do they do this practice.   The therapist shows a video on Three Ways Spirituality Supports Addiction Recovery.     Summary: [name] returns to  group rating his depression as a "0" and anxiety as a "5 with no SI or HI.   He describes his mood as being "sleepy" and "withdrawn." He note that things have been "chaotic" since he returned from the beach.  He attended a meeting when at the beach and now has a Sponsor saying that he had coffee with him on Friday and that they are to meet again on Tuesday. He believes this guy will be a good fit for him noting that his Sponsor does not talk a lot with [name] not liking people who talk a lot and also noting that he has never responded well to authority figures.  [name] says that his Sponsor believes [name] has mainly done steps one through three so they are going to "dive into" Step Four. [name] says that his boss booked him to work all week and text messaged him to come in today. [name] is tired and does not want to come in but feels guilty if he says no. The therapist models how [name] can assertively say "no" to  being asked to come in when he does not want to come in. He also notes that [name] can decline to work on his day off and does not have to be doing something "productive" on his day off to justify this decision. The therapist reminds [name] of the AA acronym, "HALT" with the "T" standing for "tired."   In discussing the spirituality exercise, [name] notes that he has likely never had "self-worth" so needs to build it.   Progress Towards Goals: [name] reports no drug or alcohol use.   UDS collected: Yes Results: No  AA/NA attended?: Yes  Sponsor?: Yes  [name], [place], [place], [facility], [place] 09/03/2022

## 2022-09-06 ENCOUNTER — Encounter (HOSPITAL_COMMUNITY): Payer: Commercial Managed Care - HMO | Admitting: Medical

## 2022-09-06 ENCOUNTER — Telehealth (HOSPITAL_COMMUNITY): Payer: Self-pay | Admitting: Licensed Clinical Social Worker

## 2022-09-06 NOTE — Telephone Encounter (Signed)
As [name] did not show for group today, the therapist attempts to reach him by phone leaving a HIPAA-compliant voicemail.  [name], [place], LCSW, [facility], [place] 09/06/2022

## 2022-09-08 ENCOUNTER — Other Ambulatory Visit (HOSPITAL_COMMUNITY): Payer: Self-pay | Admitting: Medical

## 2022-09-08 ENCOUNTER — Other Ambulatory Visit (HOSPITAL_COMMUNITY): Payer: Commercial Managed Care - HMO | Attending: Psychiatry | Admitting: Licensed Clinical Social Worker

## 2022-09-08 DIAGNOSIS — F1020 Alcohol dependence, uncomplicated: Secondary | ICD-10-CM | POA: Diagnosis present

## 2022-09-08 DIAGNOSIS — F32A Depression, unspecified: Secondary | ICD-10-CM

## 2022-09-08 DIAGNOSIS — F1520 Other stimulant dependence, uncomplicated: Secondary | ICD-10-CM | POA: Insufficient documentation

## 2022-09-08 MED ORDER — MIRTAZAPINE 30 MG PO TABS: 30.0000 mg | ORAL_TABLET | Freq: Every day | ORAL | 0 refills | Status: DC

## 2022-09-08 NOTE — Progress Notes (Signed)
Daily Group Progress Note  Program: CD IOP   Group Time: 9 a.m. to 12 p.m.  Type of Therapy: Process and Psychoeducational   Topic: The therapist checks in with group members, assesses for SI/HI/psychosis and overall level of functioning. The therapist inquires about sobriety date and number of community support meetings attended since last session.   The therapist has group members complete and discuss the questions in RP 14 Defining Spirituality as the major of group members did not complete this homework.     Summary: [name] returns to  group rating his depression as a "2" and anxiety as a "5 with no SI or HI.   He describes his mood as "relieved" and a "little anxious." He attended a meeting on Friday and met with his Sponsor on Tuesday morning.  [name] says that Sunday evening and Monday were "not great" as he was exhausted and got into an argument with his mother when [name] asked his mother to keep his daughter until Monday due to his exhaustion. [name]'s mother took this as a sign that [name] might be using again. He says that he was upset about this which was difficult as his mother is his "go to" person; however, he was able to call his Sponsor to talk about this. The therapist uses this as an example of the benefits of attending meetings and developing non-using supports.  [name] says that he took Monday, today, and tomorrow off from work. He says that his mother wonders if [name] might not have bipolar disorder. The therapist educates [name] and the group about the different types of bipolar disorder and informs [name] that he could have him complete some screening tools to rule out this diagnosis. He answers [name]'s questions about substance-induced mood disorders.  The therapist also suggests that as a result of [name]'s having used substances since he was young that he has not been able to recognize his limits causing him to use drugs and alcohol to go beyond these limits while  neglecting self-care. The therapist suggests that it is not surprising that [name] was so exhausted as he came directly back from vacation and worked an excessive number of hours in part due to not being able to say "no" to his boss who kept calling him in.  Another group member shares his experience talking to [name] about the [place] saying, "HALT." [name] talks about his social anxiety especially in large meetings with other group members having the same issue. He shares his number with another group member with this problem agreeing to attend a meeting with him to be his "wing man."   Progress Towards Goals: [name] reports no drug or alcohol use.   UDS collected: Yes Results: No  AA/NA attended?: Yes  Sponsor?: Yes  [name], [place], [place], [facility], [place] 09/08/2022

## 2022-09-10 ENCOUNTER — Encounter (HOSPITAL_COMMUNITY): Payer: Self-pay | Admitting: Medical

## 2022-09-10 ENCOUNTER — Other Ambulatory Visit (HOSPITAL_COMMUNITY): Payer: Commercial Managed Care - HMO | Attending: Psychiatry | Admitting: Licensed Clinical Social Worker

## 2022-09-10 DIAGNOSIS — F341 Dysthymic disorder: Secondary | ICD-10-CM | POA: Insufficient documentation

## 2022-09-10 DIAGNOSIS — Z811 Family history of alcohol abuse and dependence: Secondary | ICD-10-CM | POA: Diagnosis not present

## 2022-09-10 DIAGNOSIS — Z79899 Other long term (current) drug therapy: Secondary | ICD-10-CM | POA: Diagnosis not present

## 2022-09-10 DIAGNOSIS — E782 Mixed hyperlipidemia: Secondary | ICD-10-CM | POA: Insufficient documentation

## 2022-09-10 DIAGNOSIS — F410 Panic disorder [episodic paroxysmal anxiety] without agoraphobia: Secondary | ICD-10-CM | POA: Insufficient documentation

## 2022-09-10 DIAGNOSIS — K7010 Alcoholic hepatitis without ascites: Secondary | ICD-10-CM | POA: Diagnosis not present

## 2022-09-10 DIAGNOSIS — F1520 Other stimulant dependence, uncomplicated: Secondary | ICD-10-CM | POA: Insufficient documentation

## 2022-09-10 DIAGNOSIS — F1121 Opioid dependence, in remission: Secondary | ICD-10-CM | POA: Insufficient documentation

## 2022-09-10 DIAGNOSIS — I10 Essential (primary) hypertension: Secondary | ICD-10-CM | POA: Diagnosis not present

## 2022-09-10 DIAGNOSIS — F17200 Nicotine dependence, unspecified, uncomplicated: Secondary | ICD-10-CM | POA: Diagnosis not present

## 2022-09-10 DIAGNOSIS — J301 Allergic rhinitis due to pollen: Secondary | ICD-10-CM | POA: Diagnosis not present

## 2022-09-10 DIAGNOSIS — E781 Pure hyperglyceridemia: Secondary | ICD-10-CM

## 2022-09-10 DIAGNOSIS — Z818 Family history of other mental and behavioral disorders: Secondary | ICD-10-CM | POA: Insufficient documentation

## 2022-09-10 DIAGNOSIS — Z9151 Personal history of suicidal behavior: Secondary | ICD-10-CM

## 2022-09-10 DIAGNOSIS — F17290 Nicotine dependence, other tobacco product, uncomplicated: Secondary | ICD-10-CM

## 2022-09-10 DIAGNOSIS — F1020 Alcohol dependence, uncomplicated: Secondary | ICD-10-CM | POA: Diagnosis present

## 2022-09-10 MED ORDER — BUPROPION HCL ER (SR) 100 MG PO TB12: 100.0000 mg | ORAL_TABLET | Freq: Two times a day (BID) | ORAL | 2 refills | Status: DC

## 2022-09-10 NOTE — Progress Notes (Signed)
Daily Group Progress Note  Program: CD IOP   Group Time: 9 a.m. to 11:50 a.m.  Type of Therapy: Process and Psychoeducational   Topic: The therapist checks in with group members, assesses for SI/HI/psychosis and overall level of functioning. The therapist inquires about sobriety date and number of community support meetings attended since last session.   The therapist discusses the cross tolerance between benzodiazepines and alcohol and the reason that benzodiazepines are contraindicated for persons with a history of an alcohol use disorder. The therapist also discusses the limited uses for opioid pain medication such as for very short-term use for post surgical pain.  The therapist has group members complete and discuss a Electronics engineer for Establishing a Support System."     Summary: [name] returns to  group rating his depression as a "0" and anxiety as a "5 with no SI or HI.   He describes his mood as "sleepy" and "optimistic." He has an eye appointment today so has to leave group a few minutes early to make it. After this, he has nothing to do for the rest of the day so says that he is going to spend time in bed finishing the TV series, [place], before going to a 7 p.m. AA Meeting tonight.  He shares about taking a Clonazepam that he got from his friend's girlfriend before driving home from the beach questioning if he needs to change his sobriety date. The therapist informs [name] that per the SA IOP criteria what he will need to change his sobriety date as a result of taking a controlled substance that was not prescribed for him. The therapist explains the risks involved in doing so from both a recovery and a legal standpoint especially as he has an upcoming Court date for the pending DWI and hit-and-run charges.   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   Progress Towards Goals: [name] reports  taking a Clonazepam before driving back from the beach.   UDS collected: No Results: Yes; positive for a benzodiazepine  AA/NA attended?: No  Sponsor?: Yes  [name], [place], [place], [facility], [place] 09/10/2022

## 2022-09-10 NOTE — Progress Notes (Signed)
   [facility] Follow-up Outpatient CDIOP Date: 09/10/2022  Admission Date:07/28/2022  Sobriety date:07/16/2022  Subjective: "My mom thinks I may be Bipolar"  HPI:CD IOP Provider FU 55 days after last use/32 days in IOP  Pt continues to have intense disabling attacks of anxious depression that leave him paralyzed to do his daily functions especially with work. He notes triggers of extreme stress seem to induce these attacks-most recently driving back from family beach vacation thru [facility]) . There is a strong family history of what sounds like Bipolar II . Both mother and sister have been diagnosed and have responded to Wellbutrin. He would like to try this . He stopped his Effexor prior to admission here as he felt no benefit but admits he probably didnt really know about benefits as he was using when taking it No complaints of cravings. MDQ +  Review of Systems: Psychiatric: Agitation: Anxious depression/panic per HPI Hallucination: No Depressed Mood: 10/4 [name] returns to  group rating his depression as a "2" and anxiety as a "5 with no SI or HI.  Insomnia: Rx Remeron Hypersomnia: No Altered Concentration: No Feels Worthless: No Grandiose Ideas: No Belief In Special Powers: No New/Increased Substance Abuse: No Compulsions: Early withdrawal  Neurologic: Headache: No Seizure: No Paresthesias: No  Current Medications: baclofen 10 MG tablet Commonly known as: LIORESAL Take 1 tablet (10 mg total) by mouth 3 (three) times daily for 270 doses.  buPROPion ER 100 MG 12 hr tablet Commonly known as: Wellbutrin SR Take 1 tablet (100 mg total) by mouth 2 (two) times daily.  fluticasone 50 MCG/ACT nasal spray Commonly known as: FLONASE Place 1 spray into both nostrils 2 (two) times daily as needed for allergies.  hydrOXYzine 25 MG capsule Commonly known as: Vistaril Take 1 capsule (25 mg total) by mouth 3 (three) times daily as needed for up to 270 doses  for anxiety.  lisinopril 20 MG tablet Commonly known as: ZESTRIL Take 1 tablet (20 mg total) by mouth daily.  mirtazapine 30 MG tablet Commonly known as: Remeron Take 1 tablet (30 mg total) by mouth at bedtime.    Mental Status Examination  Appearance:Casual,Neat Alert: Yes Attention: good  Cooperative: Yes Eye Contact: Good Speech: Clear and coherent Psychomotor Activity: Normal Memory/Concentration: Normal/intact Oriented: person, place, time/date and situation Mood: Some anxiety Affect: Appropriate and Congruent Thought Processes and Associations: Coherent and Intact Fund of Knowledge: Good Thought Content: WDL No SI/HI Insight: Seems to be improving Judgement: like insight  UDS:9/9;9/11;09/03/2022 Nicotene andv Rx meds  PDMP:No change No illicit rx activity  Diagnosis:  Alcohol use disorder, severe, dependence (HCC) 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) Opioid use disorder, severe, in sustained remission, dependence ([place]) Family history of alcoholism in maternal grandfather Family history of anxiety disorder Early onset dysthymia Family history of bipolar disorder Panic anxiety syndrome Personal history of suicidal behavior Hypercholesterolemia with hypertriglyceridemia Secondary 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   MRN: [id number]

## 2022-09-13 ENCOUNTER — Telehealth (HOSPITAL_COMMUNITY): Payer: Self-pay | Admitting: Licensed Clinical Social Worker

## 2022-09-13 ENCOUNTER — Encounter (HOSPITAL_COMMUNITY): Payer: Commercial Managed Care - HMO

## 2022-09-13 NOTE — Telephone Encounter (Signed)
The therapist receives the following email from [place] on Friday, 09/10/22:  "[name],  I was emailing you to see if there was a possibility you could write me a note of proof that I was in and active in the IOP program and the date I started. I'm going to miss Monday due to a meeting with my lawyer but if by chance I could get that letter or paper before then that would be great. If not I understand. Thank you!  Best Regards! Alger Kerstein [phone] [email or web address]"   [name], [place], LCSW, [facility], [place] 09/13/2022

## 2022-09-14 ENCOUNTER — Emergency Department (HOSPITAL_COMMUNITY)
Admission: EM | Admit: 2022-09-14 | Discharge: 2022-09-15 | Disposition: A | Discharge status: Home / Self Care | Payer: Self-pay | Attending: Emergency Medicine | Admitting: Emergency Medicine

## 2022-09-14 ENCOUNTER — Ambulatory Visit (HOSPITAL_COMMUNITY)
Admission: RE | Admit: 2022-09-14 | Discharge: 2022-09-14 | Disposition: A | Discharge status: Home / Self Care | Payer: Commercial Managed Care - HMO | Attending: Psychiatry | Admitting: Psychiatry

## 2022-09-14 DIAGNOSIS — I10 Essential (primary) hypertension: Secondary | ICD-10-CM | POA: Insufficient documentation

## 2022-09-14 DIAGNOSIS — F10239 Alcohol dependence with withdrawal, unspecified: Secondary | ICD-10-CM | POA: Diagnosis present

## 2022-09-14 DIAGNOSIS — F10930 Alcohol use, unspecified with withdrawal, uncomplicated: Secondary | ICD-10-CM

## 2022-09-14 DIAGNOSIS — Y909 Presence of alcohol in blood, level not specified: Secondary | ICD-10-CM | POA: Diagnosis not present

## 2022-09-14 DIAGNOSIS — Z79899 Other long term (current) drug therapy: Secondary | ICD-10-CM | POA: Diagnosis not present

## 2022-09-14 MED ORDER — CHLORDIAZEPOXIDE HCL 25 MG PO CAPS: ORAL_CAPSULE | ORAL | 0 refills | Status: DC

## 2022-09-14 MED ORDER — LORAZEPAM 2 MG/ML IJ SOLN: 0.0000 mg | Freq: Two times a day (BID) | INTRAMUSCULAR | Status: DC

## 2022-09-14 MED ORDER — THIAMINE MONONITRATE 100 MG PO TABS: 100.0000 mg | ORAL_TABLET | Freq: Every day | ORAL | Status: DC

## 2022-09-14 MED ORDER — LORAZEPAM 1 MG PO TABS: 0.0000 mg | ORAL_TABLET | Freq: Two times a day (BID) | ORAL | Status: DC

## 2022-09-14 MED ORDER — LORAZEPAM 1 MG PO TABS
0.0000 mg | ORAL_TABLET | Freq: Four times a day (QID) | ORAL | Status: DC
  Administered 2022-09-14: 1 mg via ORAL
  Filled 2022-09-14: qty 1

## 2022-09-14 MED ORDER — THIAMINE HCL 100 MG/ML IJ SOLN: 100.0000 mg | Freq: Every day | INTRAMUSCULAR | Status: DC

## 2022-09-14 MED ORDER — LORAZEPAM 2 MG/ML IJ SOLN: 0.0000 mg | Freq: Four times a day (QID) | INTRAMUSCULAR | Status: DC

## 2022-09-14 MED ORDER — CHLORDIAZEPOXIDE HCL 25 MG PO CAPS
100.0000 mg | ORAL_CAPSULE | Freq: Once | ORAL | Status: AC
  Administered 2022-09-14: 100 mg via ORAL
  Filled 2022-09-14: qty 4

## 2022-09-14 NOTE — ED Triage Notes (Signed)
Pt arrived via POV states normally drink 10-12 beers daily. Last drink approx 11am today.

## 2022-09-14 NOTE — ED Provider Notes (Signed)
[facility] DEPT Provider Note   CSN: [id number] Arrival date & time: 09/14/22  1807     History {Add pertinent medical, surgical, social history, OB history to HPI:1} Chief Complaint  Patient presents with   Alcohol Problem    [name] is a 31 y.o. male with a hx of alcohol dependence & HTN who presents to the ED with complaints of EtOH withdrawal. Patient states he was EtOH free for around 50 days then 5-6 days ago he relapsed and has been binge drinking large unknown volumes daily. Has been drinking beer & malt liquor. Last drink @ 11:30AM. Reports feeling anxious, sweaty, shaky, agitated, and has had mild headaches. States he has had tapers that have helped in the past, he reached out to his sponsor and is in intensive outpatient therapy. He denies hallucinations, seizures, vomiting, abdominal pain, or syncope. Denies SI or HI.   HPI     Home Medications Prior to Admission medications   Medication Sig Start Date End Date Taking? Authorizing Provider  baclofen (LIORESAL) 10 MG tablet Take 1 tablet (10 mg total) by mouth 3 (three) times daily for 270 doses. 08/16/22 11/14/22  Dara Hoyer, PA-C  buPROPion ER (WELLBUTRIN SR) 100 MG 12 hr tablet Take 1 tablet (100 mg total) by mouth 2 (two) times daily. 09/10/22 09/10/23  Dara Hoyer, PA-C  fluticasone (FLONASE) 50 MCG/ACT nasal spray Place 1 spray into both nostrils 2 (two) times daily as needed for allergies.    [provider]  hydrOXYzine (VISTARIL) 25 MG capsule Take 1 capsule (25 mg total) by mouth 3 (three) times daily as needed for up to 270 doses for anxiety. 08/16/22   Dara Hoyer, PA-C  lisinopril (ZESTRIL) 20 MG tablet Take 1 tablet (20 mg total) by mouth daily. 08/13/22   Ladell Pier, MD  mirtazapine (REMERON) 30 MG tablet Take 1 tablet (30 mg total) by mouth at bedtime. 09/08/22 12/07/22  Dara Hoyer, PA-C      Allergies    Benadryl [diphenhydramine] and  Codeine    Review of Systems   Review of Systems  Constitutional:  Negative for chills and fever.  Respiratory:  Negative for shortness of breath.   Gastrointestinal:  Negative for abdominal pain and vomiting.  Neurological:  Positive for tremors. Negative for seizures.  Psychiatric/Behavioral:  Negative for hallucinations and suicidal ideas. The patient is nervous/anxious.   All other systems reviewed and are negative.  Physical Exam Updated Vital Signs BP 138/75 (BP Location: Right Arm)   Pulse 94   Temp 98.1 F (36.7 C) (Oral)   Resp 17   SpO2 99%  Physical Exam Vitals and nursing note reviewed.  Constitutional:      General: He is not in acute distress.    Appearance: He is well-developed. He is not toxic-appearing.  HENT:     Head: Normocephalic and atraumatic.  Eyes:     General:        Right eye: No discharge.        Left eye: No discharge.     Conjunctiva/sclera: Conjunctivae normal.  Cardiovascular:     Rate and Rhythm: Normal rate and regular rhythm.  Pulmonary:     Effort: No respiratory distress.     Breath sounds: Normal breath sounds. No wheezing or rales.  Abdominal:     General: There is no distension.     Palpations: Abdomen is soft.     Tenderness: There is no abdominal  tenderness. There is no guarding or rebound.  Musculoskeletal:     Cervical back: Neck supple.  Skin:    General: Skin is warm and dry.  Neurological:     Mental Status: He is alert.     Comments: Clear speech. Moving all extremities, ambulatory.   Psychiatric:        Behavior: Behavior normal. Behavior is cooperative.     ED Results / Procedures / Treatments   Labs (all labs ordered are listed, but only abnormal results are displayed) Labs Reviewed - No data to display  EKG None  Radiology No results found.  Procedures Procedures  {Document cardiac monitor, telemetry assessment procedure when appropriate:1}  Medications Ordered in ED Medications - No data to  display  ED Course/ Medical Decision Making/ A&P                           Medical Decision Making  ***  {Document critical care time when appropriate:1} {Document review of labs and clinical decision tools ie heart score, Chads2Vasc2 etc:1}  {Document your independent review of radiology images, and any outside records:1} {Document your discussion with family members, caretakers, and with consultants:1} {Document social determinants of health affecting pt's care:1} {Document your decision making why or why not admission, treatments were needed:1} Final Clinical Impression(s) / ED Diagnoses Final diagnoses:  None    Rx / DC Orders ED Discharge Orders     None

## 2022-09-14 NOTE — ED Notes (Signed)
Pt was seen and evaluated at [facility], he was psych cleared then encouraged to come to the ED for evaluation of May withdrawal.

## 2022-09-14 NOTE — H&P (Addendum)
Behavioral Health Medical Screening Exam  HPI: [name] is a 31 y.o. Caucasian male who presented voluntarily as a walk-in to [facility] worsening for worsening alcohol abuse.  Patient reports that he was treated at the intensive outpatient program with [facility] for his alcohol abuse treatment with therapy.  Patient reports that he relapsed this week by drinking up to 10 to 12 cans of beer this morning.  Patient reports his triggers are financial in nature.  Patient has past psychiatric history of alcohol dependence with alcohol induced mood disorder, alcohol withdrawal, chronic alcohol abuse, generalized anxiety, and mild depression.  Past medical history include allergic rhinitis.  Assessment: Patient is seen face-to-face and examined in the screen room.  Personal hygiene is fair, however patient had bad alcohol odor from his body and also his breath.  Chart reviewed and findings shared with the treatment team and Dr. Lucianne Muss.  Alert and oriented x 4, to person, place, time, and situation.  Speech clear but slow with normal pattern and volume.  Maintains fair eye contact with this provider.  Tremors noted to extremities and skin is Lennox Grumbles. Thought process coherent and thought content logical.  Sensorium with judgment, memory, and insight fair.  Patient denies suicidal ideation, homicidal ideation, paranoia, delusions, auditory or visual hallucinations.  Denies attempting suicide, denies self injurious behavior,  denies drug, denies, chronic pain, denies history of abuse or trauma, and denies access to firearms.  Patient reports legal problems of hit and run and has to appear at court on October 07, 2022.  He rates anxiety as 10/10 on a scale of 0-10 with 10 being the worst.  Reports sleeping for 6 hours last night. Reports support system as his family.  Reports being followed by the therapist and psychiatrist from the IOP program from [facility].  Reports family  history of mental illness with mom and sister diagnosed with anxiety and bipolar disorder.  Reports vaping nicotine daily.  Reports he symptoms of depression and characterized as irritability, guilt, and anhedonia.  Disposition: Based on my assessment of the patient, he is psych cleared.  However, patient is sent to [facility] long emergency room due to possible withdrawal symptoms from alcohol.  [facility] emergency room physician called regarding patient's symptoms.  Patient could be discharged after being seen at the emergency room. Left facility without any incident.  Total Time spent with patient: 1 hour  Psychiatric Specialty Exam:  Presentation  General Appearance:  Appropriate for Environment  Eye Contact: Fair  Speech: Clear and Coherent  Speech Volume: Normal  Handedness: Right  Mood and Affect  Mood: Anxious  Affect: Appropriate; Congruent  Thought Process  Thought Processes: Coherent  Descriptions of Associations:Intact  Orientation:Full (Time, Place and Person)  Thought Content:Logical  History of Schizophrenia/Schizoaffective disorder:No  Duration of Psychotic Symptoms:No data recorded Hallucinations:Hallucinations: None  Ideas of Reference:None  Suicidal Thoughts:Suicidal Thoughts: No  Homicidal Thoughts:Homicidal Thoughts: No  Sensorium  Memory: Immediate Fair; Recent Fair; Remote Fair  Judgment: Fair  Insight: Fair  Art therapist  Concentration: Good  Attention Span: Good  Recall: Fair  Fund of Knowledge: Fair  Language: Good  Psychomotor Activity  Psychomotor Activity: Psychomotor Activity: Tremor  Assets  Assets: Communication Skills; Resilience; Physical Health  Sleep  Sleep: Sleep: Good Number of Hours of Sleep: 6  Physical Exam: Physical Exam Vitals and nursing note reviewed.  HENT:     Head: Normocephalic and atraumatic.     Nose: Nose normal.  Mouth/Throat:     Mouth: Mucous membranes are  moist.     Pharynx: Oropharynx is clear.  Eyes:     Extraocular Movements: Extraocular movements intact.     Conjunctiva/sclera: Conjunctivae normal.     Pupils: Pupils are equal, round, and reactive to light.  Cardiovascular:     Rate and Rhythm: Normal rate.     Pulses: Normal pulses.  Pulmonary:     Effort: Pulmonary effort is normal.  Abdominal:     Palpations: Abdomen is soft.  Genitourinary:    Comments: Deferred Musculoskeletal:        General: Normal range of motion.     Cervical back: Normal range of motion.  Skin:    General: Skin is warm.  Neurological:     General: No focal deficit present.     Mental Status: He is oriented to person, place, and time.  Psychiatric:        Mood and Affect: Mood normal.        Behavior: Behavior normal.    Review of Systems  Constitutional: Negative.  Negative for chills and fever.  HENT: Negative.  Negative for hearing loss and tinnitus.   Eyes: Negative.  Negative for blurred vision and double vision.  Respiratory: Negative.  Negative for cough, sputum production, shortness of breath and wheezing.   Cardiovascular: Negative.  Negative for chest pain and palpitations.  Gastrointestinal: Negative.  Negative for heartburn and nausea.  Genitourinary: Negative.  Negative for dysuria and urgency.  Musculoskeletal: Negative.  Negative for myalgias and neck pain.  Skin:  Negative for itching and rash.  Neurological:  Positive for tremors. Negative for dizziness, tingling and headaches.  Endo/Heme/Allergies: Negative.  Negative for environmental allergies and polydipsia. Does not bruise/bleed easily.  Psychiatric/Behavioral:  Positive for substance abuse. The patient is nervous/anxious.    There were no vitals taken for this visit. There is no height or weight on file to calculate BMI. T 98.7, P 104, RR 22, BP 99/70, O2 Sat 98%.  Musculoskeletal: Strength & Muscle Tone: within normal limits Gait & Station: normal Patient leans:  N/A  [place] Scale:  Flowsheet Row OP Visit from 09/14/2022 in [facility] Admission (Discharged) from OP Visit from 07/15/2022 in [facility] ED to Hosp-Admission (Discharged) from 07/01/2022 in [facility] [place]  C-SSRS RISK CATEGORY No Risk No Risk No Risk       Recommendations:  Based on my evaluation the patient appears to have an emergency medical condition for which I recommend the patient be transferred to the emergency department for further evaluation.  Laretta Bolster, FNP 09/14/2022, 5:09 PM

## 2022-09-15 ENCOUNTER — Telehealth (HOSPITAL_COMMUNITY): Payer: Self-pay | Admitting: Licensed Clinical Social Worker

## 2022-09-15 ENCOUNTER — Ambulatory Visit (HOSPITAL_BASED_OUTPATIENT_CLINIC_OR_DEPARTMENT_OTHER): Payer: Self-pay | Admitting: Licensed Clinical Social Worker

## 2022-09-15 ENCOUNTER — Encounter (HOSPITAL_COMMUNITY): Payer: Self-pay | Admitting: Medical

## 2022-09-15 DIAGNOSIS — G909 Disorder of the autonomic nervous system, unspecified: Secondary | ICD-10-CM

## 2022-09-15 DIAGNOSIS — F19988 Other psychoactive substance use, unspecified with other psychoactive substance-induced disorder: Secondary | ICD-10-CM

## 2022-09-15 DIAGNOSIS — Z9151 Personal history of suicidal behavior: Secondary | ICD-10-CM

## 2022-09-15 DIAGNOSIS — F32A Depression, unspecified: Secondary | ICD-10-CM

## 2022-09-15 DIAGNOSIS — Z818 Family history of other mental and behavioral disorders: Secondary | ICD-10-CM

## 2022-09-15 DIAGNOSIS — J301 Allergic rhinitis due to pollen: Secondary | ICD-10-CM

## 2022-09-15 DIAGNOSIS — I10 Essential (primary) hypertension: Secondary | ICD-10-CM

## 2022-09-15 DIAGNOSIS — K7010 Alcoholic hepatitis without ascites: Secondary | ICD-10-CM

## 2022-09-15 DIAGNOSIS — F1520 Other stimulant dependence, uncomplicated: Secondary | ICD-10-CM

## 2022-09-15 DIAGNOSIS — F419 Anxiety disorder, unspecified: Secondary | ICD-10-CM

## 2022-09-15 DIAGNOSIS — F17290 Nicotine dependence, other tobacco product, uncomplicated: Secondary | ICD-10-CM

## 2022-09-15 DIAGNOSIS — F410 Panic disorder [episodic paroxysmal anxiety] without agoraphobia: Secondary | ICD-10-CM

## 2022-09-15 DIAGNOSIS — E782 Mixed hyperlipidemia: Secondary | ICD-10-CM

## 2022-09-15 DIAGNOSIS — E781 Pure hyperglyceridemia: Secondary | ICD-10-CM

## 2022-09-15 DIAGNOSIS — F1020 Alcohol dependence, uncomplicated: Secondary | ICD-10-CM | POA: Diagnosis not present

## 2022-09-15 DIAGNOSIS — Z811 Family history of alcohol abuse and dependence: Secondary | ICD-10-CM

## 2022-09-15 DIAGNOSIS — F1121 Opioid dependence, in remission: Secondary | ICD-10-CM

## 2022-09-15 DIAGNOSIS — F341 Dysthymic disorder: Secondary | ICD-10-CM

## 2022-09-15 NOTE — Telephone Encounter (Signed)
[name] leaves a voicemail saying that he will be back in group on Friday, 09/17/22; however, he had "personal issues" that lead to a relapse such that he was in the ER Tuesday night through Wednesday morning.  [address] MA, LCSW, [place], LCAS 09/15/2022

## 2022-09-15 NOTE — Discharge Instructions (Signed)
Please take Librium as prescribed.  Follow-up with outpatient resources as well as your therapist and sponsor as soon as possible.  Return to the emergency department for any new or worsening symptoms including but not limited to new or worsening withdrawal, vomiting, inability to keep fluids down, hallucinations, seizure activity, worsening tremors, passing out, or any other concerns.

## 2022-09-15 NOTE — Telephone Encounter (Signed)
The therapist attempts to reach [place] by phone about his letter request which the therapist was going to give to him when he came to group today; however, as he did not show, the therapist was unable to do so.  The therapist leaves a HIPAA-compliant voicemail with his callback number.  [address] MA, LCSW, [facility], LCAS 09/15/2022

## 2022-09-15 NOTE — Progress Notes (Signed)
Patient ID: [name], male   DOB: 26-Jan-1991, 31 y.o.   MRN: [id number] Did not come to group for 2nd day in row UDS + for Xanax PDMP  Has no prescription Last visit after beach trip -wanted to try Wellbutrin which his mom and sister have had sucess with-rx sent Prior illicit use was Klonopin See when he returns to review options that are not addictive (Xanax is the "crack"of Benzodiazepenes) and can benefit his struggles with anxiety.

## 2022-09-17 ENCOUNTER — Other Ambulatory Visit (HOSPITAL_COMMUNITY): Payer: Commercial Managed Care - HMO | Attending: Psychiatry | Admitting: Licensed Clinical Social Worker

## 2022-09-17 DIAGNOSIS — F419 Anxiety disorder, unspecified: Secondary | ICD-10-CM | POA: Diagnosis not present

## 2022-09-17 DIAGNOSIS — F1020 Alcohol dependence, uncomplicated: Secondary | ICD-10-CM | POA: Insufficient documentation

## 2022-09-17 DIAGNOSIS — F32A Depression, unspecified: Secondary | ICD-10-CM | POA: Diagnosis present

## 2022-09-17 NOTE — Progress Notes (Signed)
Daily Group Progress Note  Program: [facility] IOP   Group Time: 9 a.m. to 12 p.m.  Type of Therapy: Process and Psychoeducational   Topic: The therapist checks in with group members, assesses for SI/HI/psychosis and overall level of functioning. The therapist inquires about sobriety date and number of community support meetings attended since last session.    The therapist explains what is meant by catastrophizing and has group members complete and discuss a Core Beliefs Worksheet.    Summary: [name] returns to  group rating his depression as a "4" and anxiety as a "8 with no SI or HI.   He describes his mood as "sleepy" and "annoyed." He admits to the group that he relapsed and ended up at the hospital due to withdrawal. Before this, he says that he got "bad news almost every other day."   He apparently started catastrophizing about his upcoming [place] date believing that he will end up in jail for a year. He also found out that his mom's bloodwork had some concerns noting that she has spinal cancer which she has been dealing with for sometime. Lastly, he continues to work 6-7 days per week.   The therapist talks about the role that taking the benzodiapines played in leading up to his recent relapse and suggests that [name] could talk to the PA about something like Seroquel for anxiety which he has been on in the past.   It is recommended that [name] can learn from this relapse and work on stopping his catastrophizing while calling his Sponsor when upset. The therapist notes that [name]'s core belief about people being "scary and annoying" could be an impediment to his seeking support in others.   He reminds [name] of the availability of individual sessions and provides [name] again with this therapist's direct contact.   Progress Towards Goals: [name] reports relapsing on alcohol this week.  UDS collected: No Results: Yes; positive for a alprazolam.   AA/NA attended?: No  Sponsor?:  Yes  [name], [place], [place], [facility], [place] 09/17/2022

## 2022-09-20 ENCOUNTER — Other Ambulatory Visit (HOSPITAL_COMMUNITY): Payer: Commercial Managed Care - HMO | Attending: Psychiatry | Admitting: Licensed Clinical Social Worker

## 2022-09-20 ENCOUNTER — Other Ambulatory Visit (HOSPITAL_COMMUNITY): Payer: Self-pay | Admitting: Medical

## 2022-09-20 DIAGNOSIS — F1520 Other stimulant dependence, uncomplicated: Secondary | ICD-10-CM

## 2022-09-20 DIAGNOSIS — F419 Anxiety disorder, unspecified: Secondary | ICD-10-CM | POA: Insufficient documentation

## 2022-09-20 DIAGNOSIS — F1121 Opioid dependence, in remission: Secondary | ICD-10-CM

## 2022-09-20 DIAGNOSIS — Z811 Family history of alcohol abuse and dependence: Secondary | ICD-10-CM

## 2022-09-20 DIAGNOSIS — F1020 Alcohol dependence, uncomplicated: Secondary | ICD-10-CM

## 2022-09-20 DIAGNOSIS — Z818 Family history of other mental and behavioral disorders: Secondary | ICD-10-CM

## 2022-09-20 DIAGNOSIS — Z79899 Other long term (current) drug therapy: Secondary | ICD-10-CM

## 2022-09-20 MED ORDER — QUETIAPINE FUMARATE 25 MG PO TABS: 25.0000 mg | ORAL_TABLET | Freq: Every evening | ORAL | 2 refills | Status: DC | PRN

## 2022-09-20 MED ORDER — BACLOFEN 10 MG PO TABS: 10.0000 mg | ORAL_TABLET | Freq: Three times a day (TID) | ORAL | 2 refills | Status: AC

## 2022-09-20 NOTE — Progress Notes (Signed)
Refills of Baclofen and Seroquel as requested FU visit Weds 10/18

## 2022-09-20 NOTE — Progress Notes (Unsigned)
Daily Group Progress Note  Program: [facility] IOP   Group Time: 9 a.m. to 12 p.m.  Type of Therapy: Process and Psychoeducational   Topic: The therapist checks in with group members, assesses for SI/HI/psychosis and overall level of functioning. The therapist inquires about sobriety date and number of community support meetings attended since last session.   As there is a new group member in attendance today, the therapist has group members share their stories of how they came to be in [facility] IOP.     Summary: [name] returns to  group rating his depression as a "2" and anxiety as a "8 with no SI or HI.   He describes his mood as "sleepy." He attended a meeting on Friday and went out to eat with his Sponsor on Friday as well. He says that he has been "sleeping like crap" and that he is "nervous." He notes that he has been reading It will Never Happen to Me which was loaned to him by the P.A.  In sharing his story, she says that he has been to inpatient treatment for substance use six to seven times and detoxed in hospital ERs numerous times.  He admits that when he first encountered treatment that his goal was to have a "break" of "time off" from using with the plan to resume using. He says that he was only two days from picking up his 60 day chip saying that his Sponsor made him feel better that he was not completely starting over.  He is out of Baclofen and wants to see about starting Seroquel for his sleep so the therapist messages the P.A. about this and informs [place] that the P.A. will see him on 09/22/22 for a face-to-face meeting.  Progress Towards Goals: Tharun reports no drug or alcohol use  UDS collected: Yes Results: No  AA/NA attended?: Yes  Sponsor?: Yes  [name], [place], [place], [facility], [place] 09/20/2022

## 2022-09-22 ENCOUNTER — Other Ambulatory Visit (HOSPITAL_COMMUNITY): Payer: Commercial Managed Care - HMO | Attending: Psychiatry | Admitting: Medical

## 2022-09-22 DIAGNOSIS — F32A Depression, unspecified: Secondary | ICD-10-CM

## 2022-09-22 DIAGNOSIS — Z811 Family history of alcohol abuse and dependence: Secondary | ICD-10-CM

## 2022-09-22 DIAGNOSIS — F332 Major depressive disorder, recurrent severe without psychotic features: Secondary | ICD-10-CM

## 2022-09-22 DIAGNOSIS — F17200 Nicotine dependence, unspecified, uncomplicated: Secondary | ICD-10-CM | POA: Diagnosis not present

## 2022-09-22 DIAGNOSIS — F1020 Alcohol dependence, uncomplicated: Secondary | ICD-10-CM | POA: Diagnosis not present

## 2022-09-22 DIAGNOSIS — F341 Dysthymic disorder: Secondary | ICD-10-CM | POA: Diagnosis not present

## 2022-09-22 DIAGNOSIS — K7010 Alcoholic hepatitis without ascites: Secondary | ICD-10-CM

## 2022-09-22 DIAGNOSIS — F1520 Other stimulant dependence, uncomplicated: Secondary | ICD-10-CM | POA: Diagnosis not present

## 2022-09-22 DIAGNOSIS — J301 Allergic rhinitis due to pollen: Secondary | ICD-10-CM | POA: Diagnosis not present

## 2022-09-22 DIAGNOSIS — F410 Panic disorder [episodic paroxysmal anxiety] without agoraphobia: Secondary | ICD-10-CM | POA: Diagnosis not present

## 2022-09-22 DIAGNOSIS — F1990 Other psychoactive substance use, unspecified, uncomplicated: Secondary | ICD-10-CM

## 2022-09-22 DIAGNOSIS — I10 Essential (primary) hypertension: Secondary | ICD-10-CM

## 2022-09-22 DIAGNOSIS — F1121 Opioid dependence, in remission: Secondary | ICD-10-CM

## 2022-09-22 DIAGNOSIS — E782 Mixed hyperlipidemia: Secondary | ICD-10-CM

## 2022-09-22 DIAGNOSIS — F17290 Nicotine dependence, other tobacco product, uncomplicated: Secondary | ICD-10-CM

## 2022-09-22 DIAGNOSIS — E781 Pure hyperglyceridemia: Secondary | ICD-10-CM

## 2022-09-22 DIAGNOSIS — Z9151 Personal history of suicidal behavior: Secondary | ICD-10-CM

## 2022-09-22 DIAGNOSIS — Z818 Family history of other mental and behavioral disorders: Secondary | ICD-10-CM

## 2022-09-22 DIAGNOSIS — F19988 Other psychoactive substance use, unspecified with other psychoactive substance-induced disorder: Secondary | ICD-10-CM

## 2022-09-22 DIAGNOSIS — F419 Anxiety disorder, unspecified: Secondary | ICD-10-CM

## 2022-09-22 MED ORDER — TRAZODONE HCL 50 MG PO TABS: 50.0000 mg | ORAL_TABLET | ORAL | 2 refills | Status: AC

## 2022-09-22 NOTE — Progress Notes (Signed)
[name] Follow-up Outpatient CDIOP  Date: 09/22/2022  Admission Date:07/28/2022  Sobriety date:09/17/2022  Subjective: " I had a couple of pills (Xanax)"  HPI : [facility] IOP Provider FU Pt seen for FU having been absent for scheduled FU 10/11. His UDS had been + for Xanax at that time. Today he relates letting things buils up/worrying about Mothers cancer/health and his own Court issues after Lawyer gave him "worst case scenario" Which basically meant finding him guilty despite evidence to the contrary. He admitted to having Xanax in his possession.  He reports since starting Wellbutrin 10/06 that he is feeling better.   Review of Systems: Psychiatric: Agitation: Continues to self sabotage with anxiety as the explanation Hallucination: No Depressed Mood:  [name] returns to  group rating his depression as a "2" and anxiety as a "8 with no SI or HI.  Insomnia:Says Seroquel helpful Hypersomnia: No Altered Concentration: No Feels Worthless: No Grandiose Ideas: No Belief In Special Powers: No New/Increased Substance Abuse: No Compulsions: On going substance abuse  Neurologic: Headache: No Seizure: No Paresthesias: No  Current Medications: baclofen 10 MG tablet Commonly known as: LIORESAL Take 1 tablet (10 mg total) by mouth 3 (three) times daily for 270 doses.  buPROPion ER 100 MG 12 hr tablet Commonly known as: Wellbutrin SR Take 1 tablet (100 mg total) by mouth 2 (two) times daily.  fluticasone 50 MCG/ACT nasal spray Commonly known as: FLONASE Place 1 spray into both nostrils 2 (two) times daily as needed for allergies.  hydrOXYzine 25 MG capsule Commonly known as: Vistaril Take 1 capsule (25 mg total) by mouth 3 (three) times daily as needed for up to 270 doses for anxiety.  lisinopril 20 MG tablet Commonly known as: ZESTRIL Take 1 tablet (20 mg total) by mouth daily.  mirtazapine 30 MG tablet Commonly known as: Remeron Take 1 tablet (30 mg total) by mouth  at bedtime.  QUEtiapine 25 MG tablet Commonly known as: SEROQUEL Take 1 tablet (25 mg total) by mouth at bedtime and may repeat dose one time if needed.  traZODone 50 MG tablet Commonly known as: DESYREL Take 1 tablet (50 mg total) by mouth 60 (sixty) minutes before procedure for 1 dose.    Mental Status Examination  Appearance:Casual Alert: Yes Attention: good  Cooperative: Yes Eye Contact: Good Speech: Clear and coherent Psychomotor Activity: Normal Memory/Concentration: Normal/intact Oriented: person, place, time/date and situation Mood: Anxious Affect: Appropriate and Congruent Thought Processes and Associations: Coherent and Intact Fund of Knowledge: WDL Thought Content: No HI/SI Associations intact Insight: Lacking Judgement: Poor  UDS:09/08/22 Xanax 10/13 Wellbutrin  PDMP:Obtained Rx for Librium 09/15/2022 Medical Decision Making Risk OTC drugs. Prescription drug management.     Patient presents to the ED with complaints of EtOH withdrawal.  Patient is nontoxic, vitals on arrival WNL.  CIWA 4.  Given ativan & librium in the ED.  Observed with reassuring re-assessments. Does not appear to be in Dts or require admission for withdrawal @ this time. Will tx w/ librium taper with outpatient resources. We discussed strict ED return precautions given his history of requiring admission in the past.    I discussed treatment plan, need for follow-up, and return precautions with the patient. Provided opportunity for questions, patient confirmed understanding and is in agreement with plan.    Diagnosis:  Alcohol use disorder, severe, dependence (HCC) Severe stimulant use disorder (HCC) Opioid use disorder, severe, in sustained remission, dependence ([place]) Family history of alcoholism in maternal grandfather Family history  of anxiety disorder Acute alcoholic liver disease Early onset dysthymia Family history of bipolar disorder Panic anxiety syndrome Personal history of  suicidal behavior Hypercholesterolemia with hypertriglyceridemia Secondary hypertriglyceridemia Other tobacco product nicotine dependence, uncomplicated Allergic rhinitis due to pollen, unspecified seasonality Accelerated hypertension Mild depression Chronic anxiety Substance induced disorder of autonomic nervous system (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   MRN: [id number]

## 2022-09-22 NOTE — Progress Notes (Signed)
Daily Group Progress Note  Program: [facility] IOP   Group Time: 9 a.m. to 12 p.m.  Type of Therapy: Process and Psychoeducational   Topic: The therapist checks in with group members, assesses for SI/HI/psychosis and overall level of functioning. The therapist inquires about sobriety date and number of community support meetings attended since last session.   The therapist has group members complete the "Understanding Ambivalence To Change-Worksheet" and the "Building Discrepancy" Worksheet and discuss them with the group.     Summary: [name] returns to  group rating his depression as a "2" and anxiety as a "8 with no SI or HI.   He has attended no meetings. He is "thankful" as today is his mother's birthday. He is "resentful" and "betrayed" as a friend of his is now seeing his ex. He complains that his ex has a new man every few months and introduces them to their daughter which he does not believe that she should do. He says that she apparently has drunk beer in front of their daughter which neither are supposed to do per their agreement. He questions why she is doing what she is doing and does not see it. The therapist responds by noting that her behavior is not surprising as she is in active addiction per [place].  The therapist notes that all he can do is monitor the situation and call Child Protective Services as needed or have his lawyer take the case back to Court to request a revision in the custody agreement based on what his ex is doing.   [name] shares his worksheets with the group admitting that he can seen no valid reason for returning to using. The therapist notes that [name]'s being sober gives his daughter at least one sober parent versus two in active addiction.   Progress Towards Goals: [name] reports no drug or alcohol use  UDS collected: No Results: No  AA/NA attended?: No  Sponsor?: Yes  [name], [place], LCSW, [facility], [place] 09/22/2022

## 2022-09-24 ENCOUNTER — Other Ambulatory Visit (HOSPITAL_COMMUNITY): Payer: Commercial Managed Care - HMO | Attending: Psychiatry | Admitting: Licensed Clinical Social Worker

## 2022-09-24 DIAGNOSIS — F419 Anxiety disorder, unspecified: Secondary | ICD-10-CM | POA: Insufficient documentation

## 2022-09-24 DIAGNOSIS — F1520 Other stimulant dependence, uncomplicated: Secondary | ICD-10-CM

## 2022-09-24 DIAGNOSIS — F1020 Alcohol dependence, uncomplicated: Secondary | ICD-10-CM | POA: Diagnosis not present

## 2022-09-24 DIAGNOSIS — F32A Depression, unspecified: Secondary | ICD-10-CM | POA: Diagnosis present

## 2022-09-24 NOTE — Progress Notes (Signed)
Daily Group Progress Note  Program: CD IOP   Group Time: 9 a.m. to 12 p.m.  Type of Therapy: Process and Psychoeducational   Topic: The therapist checks in with group members, assesses for SI/HI/psychosis and overall level of functioning. The therapist inquires about sobriety date and number of community support meetings attended since last session.   The therapist covers Matrix Modules RP 15 on Managing Life Managing Money and RP 16 on Relapse Justification.    Summary: Shoua returns to  group rating his depression as a "2" and anxiety as a "8 with no SI or HI.   He says that his mood is "sleepy" and "worried" and talks about possibly having issues with Wellbutrin making him feel like when he took Adderall and negatively impacting his ability to sleep. The therapist contacts the P.A. who will see Vickie on Monday, 09/27/22, and who advises that Jhace decrease from two to one pill in the interim.  Reshard says that he is going to a meeting tonight. He is likely the most active participate in the discussion of the Matrix Modules sharing about how he would take alcohol when at [place] and walk around the store before going in the bathroom to drink it. He says that he had his mother manage his finances when he got out of treatment.   He is planning on going to the [place] this weekend.   Progress Towards Goals: Noeh reports no drug or alcohol use  UDS collected: No Results: Yes; negative for drugs or alcohol  AA/NA attended?: No  Sponsor?: Yes  [name], [place], LCSW, [facility], LCAS 09/24/2022

## 2022-09-27 ENCOUNTER — Other Ambulatory Visit (HOSPITAL_COMMUNITY): Payer: Commercial Managed Care - HMO | Attending: Medical | Admitting: Licensed Clinical Social Worker

## 2022-09-27 DIAGNOSIS — E781 Pure hyperglyceridemia: Secondary | ICD-10-CM

## 2022-09-27 DIAGNOSIS — K7010 Alcoholic hepatitis without ascites: Secondary | ICD-10-CM

## 2022-09-27 DIAGNOSIS — F1020 Alcohol dependence, uncomplicated: Secondary | ICD-10-CM | POA: Diagnosis not present

## 2022-09-27 DIAGNOSIS — Z818 Family history of other mental and behavioral disorders: Secondary | ICD-10-CM | POA: Diagnosis not present

## 2022-09-27 DIAGNOSIS — F1320 Sedative, hypnotic or anxiolytic dependence, uncomplicated: Secondary | ICD-10-CM | POA: Diagnosis not present

## 2022-09-27 DIAGNOSIS — E782 Mixed hyperlipidemia: Secondary | ICD-10-CM

## 2022-09-27 DIAGNOSIS — F341 Dysthymic disorder: Secondary | ICD-10-CM

## 2022-09-27 DIAGNOSIS — F410 Panic disorder [episodic paroxysmal anxiety] without agoraphobia: Secondary | ICD-10-CM | POA: Insufficient documentation

## 2022-09-27 DIAGNOSIS — F17200 Nicotine dependence, unspecified, uncomplicated: Secondary | ICD-10-CM | POA: Diagnosis not present

## 2022-09-27 DIAGNOSIS — Z811 Family history of alcohol abuse and dependence: Secondary | ICD-10-CM

## 2022-09-27 DIAGNOSIS — F17290 Nicotine dependence, other tobacco product, uncomplicated: Secondary | ICD-10-CM

## 2022-09-27 DIAGNOSIS — I10 Essential (primary) hypertension: Secondary | ICD-10-CM | POA: Insufficient documentation

## 2022-09-27 DIAGNOSIS — F1520 Other stimulant dependence, uncomplicated: Secondary | ICD-10-CM | POA: Insufficient documentation

## 2022-09-27 DIAGNOSIS — F1121 Opioid dependence, in remission: Secondary | ICD-10-CM | POA: Insufficient documentation

## 2022-09-27 DIAGNOSIS — J301 Allergic rhinitis due to pollen: Secondary | ICD-10-CM | POA: Insufficient documentation

## 2022-09-27 DIAGNOSIS — Z9151 Personal history of suicidal behavior: Secondary | ICD-10-CM

## 2022-09-27 NOTE — Progress Notes (Signed)
Daily Group Progress Note  Program: CD IOP   Group Time: 9 a.m. to 12 p.m.  Type of Therapy: Process and Psychoeducational   Topic: The therapist checks in with group members, assesses for SI/HI/psychosis and overall level of functioning. The therapist inquires about sobriety date and number of community support meetings attended since last session.   The therapist shows videos on "10 Effects of Growing Up with an Alcoholic or Addict Parent" and "Why Do People with Addictions Choose Drugs/Alcohol Over Their Families" and facilitates a discussion concerning the material presented.       Summary: [name] returns to  group rating his depression as a "0" and anxiety as a "7 with no SI or HI.   He says that he did not take the Wellbutrin yesterday. He describes his mood as "sleepy' and says that the older his daughter gets that the harder if feels for him to do things himself.   He attended a meeting Friday night. He talks to the [employer] about getting the [place] testing; however, another group member who has the insurance complains about it not being covered saying that he will have to pay $5,500 such that this therapist has to look into this further.   In discussing the videos presented in group today, [name] says that they describe many of his mom's traits as her father was an alcoholic. The therapist connects this information to [name]'s ex who is the mother of their daughter and actively in addiction.   The therapist asks about [name]'s work schedule, and he has continued to work the excessive 6-7 day a week work schedule but has two days off next week. The therapist suggests that if his employer calls on his days off that [name] simply not answer so as to not be called in.   Progress Towards Goals: [name] reports no drug or alcohol use  UDS collected: Yes Results: No  AA/NA attended?: Yes  Sponsor?: Yes  [name], [place], LCSW, [facility], [place] 09/27/2022

## 2022-09-27 NOTE — Progress Notes (Signed)
[facility] Follow-up Outpatient CDIOP Date: 09/27/2022  Admission Date:07/28/2022  Sobriety date:09/08/2022  Subjective: ''I stopped taking Wellbutrin.I read about the side effects and its making me more nervous"  HPI : [name] is seen today after telling counselor about stopping his Wellbutrin-this despite his initial belief in it because Mom's successs. He initially reported success but apparently after reading side effects he no longer felt comfotrable with the medication.  Review of Systems: Psychiatric: Agitation: On going/self stimulating Hallucination: No Depressed Mood:  [name] returns to  group rating his depression as a "2" and anxiety as a "8 with no SI or HI.  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  buPROPion ER 100 MG 12 hr tablet Commonly known as: Wellbutrin SR Take 1 tablet (100 mg total) by mouth 2 (two) times daily.  chlordiazePOXIDE 25 MG capsule Commonly known as: LIBRIUM Take 25 mg by mouth 3 (three) times daily as needed for withdrawal (prescribed from ED).  fluticasone 50 MCG/ACT nasal spray Commonly known as: FLONASE Place 1 spray into both nostrils 2 (two) times daily as needed for allergies.  hydrOXYzine 25 MG capsule Commonly known as: Vistaril Take 1 capsule (25 mg total) by mouth 3 (three) times daily as needed for up to 270 doses for anxiety.  lisinopril 20 MG tablet Commonly known as: ZESTRIL Take 1 tablet (20 mg total) by mouth daily.  mirtazapine 30 MG tablet Commonly known as: Remeron Take 1 tablet (30  mg total) by mouth at bedtime.  QUEtiapine 25 MG tablet Commonly known as: SEROQUEL Take 1 tablet (25 mg total) by mouth at bedtime and may repeat dose one time if needed.  traZODone 50 MG tablet Commonly known as: DESYREL Take 1 tablet (50 mg total) by mouth 60 (sixty) minutes before procedure for 1 dose.   Mental Status Examination  Appearance: Alert: Yes Attention: good  Cooperative: Yes Eye Contact: Good Speech: Clear and coherent Psychomotor Activity: Normal Memory/Concentration: Normal/intact Oriented: person, place, time/date and situation Mood: Euthymic Affect: Appropriate and Congruent Thought Processes and Associations: Coherent and Intact Fund of Knowledge: Good Thought Content: WDL Insight: Good Judgement: Good  UDS:+ for Xanax 09/08/2022  PDMP: 09/15/2022 Librium 25 mg # 10 (from ED for Alcohol W/D ? 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 Sought Benzos thru ED (UDS does not shoe he took any of the Librium Rx he filled).  Adult Grandchild of Alcoholic syndrome  Treatment Plan:Per admission  Stop Wellbutrin rx and note in Allergy Genesight study Notify Counselor of ED visit 09/14/22 and Rx for Librium  Jeff Morn, PA-C Patient ID: LADELL LEA,  male   DOB: September 05, 1991, 31 y.o.  MRN: [id number]

## 2022-09-28 ENCOUNTER — Telehealth (HOSPITAL_COMMUNITY): Payer: Self-pay | Admitting: Licensed Clinical Social Worker

## 2022-09-28 NOTE — Telephone Encounter (Signed)
Nabor leaves a voicemail saying that he may likely not be in group tomorrow as he is in the middle of a big move but will definitely be in group on Friday.  [address], [place], LCSW, [facility], LCAS 09/28/2022

## 2022-09-29 ENCOUNTER — Encounter (HOSPITAL_COMMUNITY): Payer: Self-pay | Admitting: Medical

## 2022-09-29 ENCOUNTER — Other Ambulatory Visit (HOSPITAL_COMMUNITY): Payer: Commercial Managed Care - HMO | Attending: Psychiatry | Admitting: Licensed Clinical Social Worker

## 2022-09-29 DIAGNOSIS — F1020 Alcohol dependence, uncomplicated: Secondary | ICD-10-CM | POA: Diagnosis present

## 2022-09-29 DIAGNOSIS — F1520 Other stimulant dependence, uncomplicated: Secondary | ICD-10-CM | POA: Insufficient documentation

## 2022-09-29 NOTE — Progress Notes (Signed)
Daily Group Progress Note  Program: CD IOP   Group Time: 9 a.m. to 12 p.m.  Type of Therapy: Process and Psychoeducational   Topic: The therapist checks in with group members, assesses for SI/HI/psychosis and overall level of functioning. The therapist inquires about sobriety date and number of community support meetings attended since last session.   The therapist shares the Just for Today reading from NA on the concept of playing principles before personalities and discusses what is meant by this and why it is important in regard to a person's being able to stay connected to a Twelve Step Program.  The therapist covers Matrix module RP 17 on Taking Care of Yourself and module RP 18 on Emotional Triggers.       Summary: [name] returns to  group rating his depression as a "0" and anxiety as a "5 with no SI or HI.   [name]'s presenting at group today is a bit of a surprise as he left a voicemail for this therapist yesterday noting that he likely will not make group as he was involved in a big move at work.   He describes his mood as "optimistic." He also says that he is "sleepy' and "sore" having hurt himself a little at work. 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   The therapist makes the observation that [name] seems to be always working with [name] admitting that he does not really know what to do with off days when he has them. The therapist suggests that [name] may need to explore developing some hobbies especially as [name] notes that one of the emotions that can lead to using for him is "burnout."  He talks about working with a guy who drinks constantly at work and who smells like alcohol realizing that this used to be him which he finds somewhat embarrassing. [name] denies any cravings to use but does talk about having vivid using dreams lately.  As for taking care of himself, he says that he  now has a PCP for the first time in 8-9 years. He talks about loneliness as being a trigger to use and admits that he is fearful of trying to date sober due to the anxiety saying that he would normally drink about three beers to quell this.  The therapist introduces [name] and group to the concepts put forth in acceptance and commitment therapy noting that it is normal to be nervous before a date and thus there is no need to medicate this feeling away.    Progress Towards Goals: [name] reports no drug or alcohol use  UDS collected: No Results: No  AA/NA attended?: No  Sponsor?: Yes  [name], [place], LCSW, [facility], LCAS 09/29/2022

## 2022-10-01 ENCOUNTER — Other Ambulatory Visit (HOSPITAL_COMMUNITY): Payer: Commercial Managed Care - HMO | Attending: Psychiatry | Admitting: Licensed Clinical Social Worker

## 2022-10-01 DIAGNOSIS — F1020 Alcohol dependence, uncomplicated: Secondary | ICD-10-CM

## 2022-10-01 DIAGNOSIS — F419 Anxiety disorder, unspecified: Secondary | ICD-10-CM | POA: Insufficient documentation

## 2022-10-01 DIAGNOSIS — F1520 Other stimulant dependence, uncomplicated: Secondary | ICD-10-CM

## 2022-10-01 DIAGNOSIS — F32A Depression, unspecified: Secondary | ICD-10-CM | POA: Diagnosis present

## 2022-10-01 NOTE — Progress Notes (Signed)
Daily Group Progress Note  Program: CD IOP   Group Time: 9 a.m. to 12 p.m.  Type of Therapy: Process and Psychoeducational  Topic: The therapist checks in with group members, assesses for SI/HI/psychosis and overall level of functioning. The therapist inquires about sobriety date and number of community support meetings attended since last session.   The therapist introduces concepts from Acceptance and Commitment therapy and has group members begin working on and discussing ACT handouts on clarifying their values, Dissecting the Problem, The Life Compass, etcetera.  The therapist discusses how self-compassion involves identifying and changing critical self-talk.       Summary: [name] presents rating his depression as a "2" and anxiety as a "5 with no SI or HI.   He has attended no meetings but plans on attending tonight. He describes his mood as being "surprised" and "disgusted" talking at length about how his ex tried to let her have their daughter on Sunday as he has their daughter this weekend and on trick or treat.  She then asked if she could go trick or treating with them on Monday and messaged Peter's mom to see if she would intercede with his mother ignoring the message.  Brentyn questions how she could ask this as she tried to take custody from him twice and the restraining order just expired. Additionally, she will not even allow Malekai to pick up or drop their daughter off at her house.   The therapist and other group members validate [name]'s feelings; however, the therapist makes the observation that this could be an opportunity to start figuring out a way to work with his daughter's mother for the benefit of their daughter. The therapist models how [name] could assert himself in discussing his feelings in light of her request but then question what his ex is willing to give up in exchange for [name]'s being willing to work with her.  [name] does not feel able to do this at  this point in his recovery; however, he is willing to file this away for another time. He notes that he likely will need to see a doctor due to the pain in his ribs from the injury at work.  Progress Towards Goals: [name] reports no drug or alcohol use  UDS collected: No Results: Yes; negative for drugs and alcohol  AA/NA attended?: No  Sponsor?: Yes  [name], [place], LCSW, [facility], LCAS 10/01/2022

## 2022-10-04 ENCOUNTER — Encounter (HOSPITAL_COMMUNITY): Payer: Self-pay | Admitting: Licensed Clinical Social Worker

## 2022-10-04 ENCOUNTER — Other Ambulatory Visit (HOSPITAL_COMMUNITY): Payer: Commercial Managed Care - HMO | Attending: Psychiatry | Admitting: Licensed Clinical Social Worker

## 2022-10-04 DIAGNOSIS — F1520 Other stimulant dependence, uncomplicated: Secondary | ICD-10-CM

## 2022-10-04 DIAGNOSIS — F419 Anxiety disorder, unspecified: Secondary | ICD-10-CM | POA: Insufficient documentation

## 2022-10-04 DIAGNOSIS — F1020 Alcohol dependence, uncomplicated: Secondary | ICD-10-CM

## 2022-10-04 DIAGNOSIS — F32A Depression, unspecified: Secondary | ICD-10-CM | POA: Diagnosis present

## 2022-10-04 DIAGNOSIS — R450 Nervousness: Secondary | ICD-10-CM | POA: Insufficient documentation

## 2022-10-04 NOTE — Progress Notes (Signed)
Daily Group Progress Note  Program: CD IOP   Group Time: 9 a.m. to 12 p.m.  Type of Therapy: Process and Psychoeducational   Topic: The therapist checks in with group members, assesses for SI/HI/psychosis and overall level of functioning. The therapist inquires about sobriety date and number of community support meetings attended since last session.   The therapist has group members share what they recorded on their  ACT handouts; Dissecting the Problem, The Life Compass, etcetera and provides feedback.       Summary: [name] presents rating his depression as a "2" and anxiety as a "5 or 6" with no SI or HI.   He attended a meeting on Friday. When asked where he is in relation to steps, he says that he has been talking with his Sponsor about starting Step 4. The therapist observes that [name] has not been attending many meetings; however, his Sponsor has not commented on this or had him redo Step 1 after his recent relapse. [name] admits that his Sponsor may have too many Sponsees to manage.  [name] describes his mood as "sleepy" and "thankful." He says that he is going to be off today and plans on attending a movie recognizing that he needs to work less. The therapist talks with [name] about his confusion about what to do with his leisure time and discusses [name]'s belief that he is "boring" when not using eliciting feedback on the group with almost all the group feeling the same way.  The therapist observes that what the group members describe as being less boring is in reality being less socially anxious and self-conscious during social interactions when under the influence of drugs and alcohol.  The therapist normalizes being a little nervous and self-conscious during different types of social interactions pointing out the positives of having this awareness.   [name] says that he will not be in group on Wednesday as he will have his daughter but will attend on Friday.  Progress Towards  Goals: [name] reports no drug or alcohol use  UDS collected: Yes Results: No  AA/NA attended?: Yes  Sponsor?: Yes  [name], [place], [place], [facility], [place] 10/04/2022

## 2022-10-06 ENCOUNTER — Encounter (HOSPITAL_COMMUNITY): Payer: Commercial Managed Care - HMO

## 2022-10-08 ENCOUNTER — Encounter (HOSPITAL_COMMUNITY): Payer: Commercial Managed Care - HMO

## 2022-10-11 ENCOUNTER — Telehealth (HOSPITAL_COMMUNITY): Payer: Self-pay | Admitting: Licensed Clinical Social Worker

## 2022-10-11 ENCOUNTER — Ambulatory Visit (HOSPITAL_COMMUNITY): Payer: Commercial Managed Care - HMO | Admitting: Medical

## 2022-10-11 NOTE — Telephone Encounter (Signed)
As [name] was not in [place] IOP on 10/08/22 and today, the therapist attempts to reach him leaving a HIPAA-compliant voicemail.  [address], [place], LCSW, [facility], LCAS 10/11/2022

## 2022-10-13 ENCOUNTER — Encounter (HOSPITAL_COMMUNITY): Payer: Self-pay

## 2022-10-13 ENCOUNTER — Encounter (HOSPITAL_COMMUNITY): Payer: Commercial Managed Care - HMO

## 2022-10-13 NOTE — Progress Notes (Addendum)
[facility] CD IOP                                                                                Discharge Summary   Date of Admission: 07/28/2022 [facility] Source:  [facility]                                                                         Date of Discharge: 10/13/2022 Sobriety Date:Unknown Admission Diagnosis: 1. Alcohol use disorder, severe, dependence (HCC)  F10.20       2. Severe stimulant use disorder (HCC)  F15.20       3. Opioid use disorder, severe, in sustained remission, dependence (HCC)  F11.21       4. Early onset dysthymia  F34.1       5. Chronic anxiety  F41.9       6. Panic anxiety syndrome  F41.0       7. 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 Family history of alcoholism in maternal grandfather  Z27.1      Died of alcoholic liver disease     9. Personal history of suicidal behavior  Z91.51       10. Family history of anxiety disorder  Z81.8      Maternal-Mother is Adult child of Alcohol father     56. Accelerated hypertension  I10       12. Acute alcoholic liver disease  K70.10      Elevated LFTs     13. Hypercholesterolemia with hypertriglyceridemia  E78.2       14. Secondary hypertriglyceridemia  E78.1      Alcohol related     15. Other tobacco product nicotine dependence, uncomplicated  F17.290       16. Allergic rhinitis due to pollen, unspecified seasonality  J30.1        Course of Treatment:  Pt course was erratic in group attendance with work,legal issues and continued use of illicit Benzodiazapenes.On 09/15/2022 he presented to ED c/o of ETOH withdrawal despite having no alcohol use. He was given a prescription for Librium despite no alcohol and CIWA score of 4.  He remained focused on his anxiety and multiple efforts to treat with non addictive meds did not satisfy. After about 1 week of trying them he would report  he had read about the side effects and he was having them.  Genesight was recommended but patient never went for swab. He failed to attend the last 3 scheduled groups and failed to respond to [employer] . Counselor recommended discharge today.   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 Continue alcohol and drug refusal skills and call on support system  Attend AA/NA meetings AT LEAST as often as you use  Obtain a sponsor and a home group in [place]. Return to PCP  Referrals:  Aftercare:NA Medication management:PCP Other:NA  Next appointment: NA  Prognosis:Poor/Guarded at best  ADDENDUM: Counselor researched [place] records as patient had scheduled appearance 10/07/22 for DWI.Court records show Level 2 conviction which would result on loss of DL.  Client has NOT  participated in the development of this discharge plan and may receive a copy of this completed plan  Patient ID: [name], male   DOB: 11/23/1991, 31 y.o.   MRN: [id number]

## 2022-10-16 ENCOUNTER — Other Ambulatory Visit (HOSPITAL_COMMUNITY): Payer: Self-pay | Admitting: Medical

## 2022-10-18 NOTE — Telephone Encounter (Signed)
Patient no longer under care here

## 2022-11-12 ENCOUNTER — Other Ambulatory Visit: Payer: Self-pay | Admitting: Internal Medicine

## 2022-11-12 ENCOUNTER — Other Ambulatory Visit (HOSPITAL_COMMUNITY): Payer: Self-pay | Admitting: Medical

## 2022-11-12 DIAGNOSIS — I10 Essential (primary) hypertension: Secondary | ICD-10-CM

## 2022-11-12 NOTE — Telephone Encounter (Signed)
Requested Prescriptions  Pending Prescriptions Disp Refills   lisinopril (ZESTRIL) 20 MG tablet [Pharmacy Med Name: LISINOPRIL 20MG  TABLETS] 90 tablet 0    Sig: TAKE 1 TABLET(20 MG) BY MOUTH DAILY     Cardiovascular:  ACE Inhibitors Failed - 11/12/2022  3:46 AM      Failed - Last BP in normal range    BP Readings from Last 1 Encounters:  09/15/22 (!) 140/68         Passed - Cr in normal range and within 180 days    Creatinine, Ser  Date Value Ref Range Status  07/16/2022 1.07 0.61 - 1.24 mg/dL Final         Passed - K in normal range and within 180 days    Potassium  Date Value Ref Range Status  07/16/2022 4.1 3.5 - 5.1 mmol/L Final         Passed - Patient is not pregnant      Passed - Valid encounter within last 6 months    Recent Outpatient Visits           3 months ago Establishing care with new doctor, encounter for   [facility], MD       Future Appointments             In 3 days Marcine Matar, MD [facility]

## 2022-11-14 ENCOUNTER — Other Ambulatory Visit (HOSPITAL_COMMUNITY): Payer: Self-pay | Admitting: Medical

## 2022-11-15 ENCOUNTER — Ambulatory Visit: Payer: Commercial Managed Care - HMO | Attending: Internal Medicine | Admitting: Internal Medicine

## 2022-11-15 VITALS — BP 121/80 | HR 77 | Temp 98.5°F | Ht 73.0 in | Wt 229.0 lb

## 2022-11-15 DIAGNOSIS — F1021 Alcohol dependence, in remission: Secondary | ICD-10-CM

## 2022-11-15 DIAGNOSIS — F331 Major depressive disorder, recurrent, moderate: Secondary | ICD-10-CM

## 2022-11-15 DIAGNOSIS — I10 Essential (primary) hypertension: Secondary | ICD-10-CM

## 2022-11-15 DIAGNOSIS — F411 Generalized anxiety disorder: Secondary | ICD-10-CM | POA: Diagnosis not present

## 2022-11-15 MED ORDER — HYDROXYZINE PAMOATE 25 MG PO CAPS: 25.0000 mg | ORAL_CAPSULE | Freq: Three times a day (TID) | ORAL | 0 refills | Status: DC | PRN

## 2022-11-15 MED ORDER — LISINOPRIL 20 MG PO TABS: 20.0000 mg | ORAL_TABLET | Freq: Every day | ORAL | 1 refills | Status: AC

## 2022-11-15 MED ORDER — QUETIAPINE FUMARATE 25 MG PO TABS: 25.0000 mg | ORAL_TABLET | Freq: Every evening | ORAL | 0 refills | Status: DC | PRN

## 2022-11-15 MED ORDER — MIRTAZAPINE 30 MG PO TABS: 30.0000 mg | ORAL_TABLET | Freq: Every day | ORAL | 0 refills | Status: DC

## 2022-11-15 NOTE — Progress Notes (Signed)
Patient ID: [name], male    DOB: 1991-09-14  MRN: [id number]  CC: Follow-up (Med refill. Tawanna Cooler gene testing to determine best medication at [facility], would like to discuss results./Already received flu vax this season )   Subjective: [name] is a 31 y.o. male who presents for f/u visit His concerns today include:  Patient with history of anxiety/depression, EtOH use disorder, EtOH withdrawal seizure at the age of 50, Recent DWI, HTN, HL  Hx of ETOH UD:  He was in intensive out-pt program through [facility] [place].  He was seeing a Veterinary surgeon, going to group therapy and mental health provider for medication management. Stopped going because he loss his driver's license for 1 yr due to DWI ruling last mth for incident that occur 12/2021.  He also just moved to [place].  He was able to come to this visit today because his mother drove him.  He is requesting to go over genetic test that was done by his behavioral health provider PA Maryjean Morn to try to find a medication that would work best for him for depression and anxiety.  States he was tried with BuSpar and Wellbutrin in the past but neither worked well. He is requesting refill on some of his psychiatric medications including Seroquel, Remeron, hydroxyzine.  He is also requesting refill on baclofen which she states was being prescribed to help decrease his cravings for alcohol. Does not feel he is adequately controlled on current meds -Reports having a relapse and alcohol use 09/15/2022 for 2 days but clean since then.  HTN:  taking and tolerating Lisinopril 20 mg daily.  Needs RF    Patient Active Problem List   Diagnosis Date Noted   Allergic rhinitis 07/02/2022   Chronic alcohol abuse 07/02/2022   Alcohol withdrawal (HCC) 07/01/2022   Alcohol dependence with alcohol-induced mood disorder (HCC)    Generalized anxiety disorder 09/07/2021   Mild depression 09/07/2021     Current Outpatient  Medications on File Prior to Visit  Medication Sig Dispense Refill   baclofen (LIORESAL) 10 MG tablet Take 1 tablet (10 mg total) by mouth 3 (three) times daily for 270 doses. 90 tablet 2   fluticasone (FLONASE) 50 MCG/ACT nasal spray Place 1 spray into both nostrils 2 (two) times daily as needed for allergies.     hydrOXYzine (VISTARIL) 25 MG capsule Take 1 capsule (25 mg total) by mouth 3 (three) times daily as needed for up to 270 doses for anxiety. 90 capsule 2   lisinopril (ZESTRIL) 20 MG tablet TAKE 1 TABLET(20 MG) BY MOUTH DAILY 90 tablet 0   mirtazapine (REMERON) 30 MG tablet Take 1 tablet (30 mg total) by mouth at bedtime. 90 tablet 0   QUEtiapine (SEROQUEL) 25 MG tablet Take 1 tablet (25 mg total) by mouth at bedtime and may repeat dose one time if needed. 60 tablet 2   traZODone (DESYREL) 50 MG tablet Take 1 tablet (50 mg total) by mouth 60 (sixty) minutes before procedure for 1 dose. 60 tablet 2   No current facility-administered medications on file prior to visit.    Allergies  Allergen Reactions   Wellbutrin [Bupropion] Anxiety   Benadryl [Diphenhydramine] Anxiety and Other (See Comments)    Increased anxiety   Codeine Nausea And Vomiting and Rash    Social History   Socioeconomic History   Marital status: Single    Spouse name: Not on file   Number of children: 1   Years  of education: Not on file   Highest education level: Not on file  Occupational History   Not on file  Tobacco Use   Smoking status: Never   Smokeless tobacco: Never  Vaping Use   Vaping Use: Every day   Substances: Nicotine, Flavoring  Substance and Sexual Activity   Alcohol use: Yes    Alcohol/week: 70.0 standard drinks of alcohol    Types: 70 Standard drinks or equivalent per week   Drug use: Not Currently    Types: Benzodiazepines, Cocaine   Sexual activity: Yes  Other Topics Concern   Not on file  Social History Narrative   Not on file   Social Determinants of Health   Financial  Resource Strain: Not on file  Food Insecurity: Not on file  Transportation Needs: Not on file  Physical Activity: Not on file  Stress: Not on file  Social Connections: Not on file  Intimate Partner Violence: Not on file    Family History  Problem Relation Age of Onset   Lupus Mother    Hypertension Father     No past surgical history on file.  ROS: Review of Systems Negative except as stated above  PHYSICAL EXAM: BP 121/80 (BP Location: Left Arm, Patient Position: Sitting, Cuff Size: Normal)   Pulse 77   Temp 98.5 F (36.9 C) (Oral)   Ht 6\' 1"  (1.854 m)   Wt 229 lb (103.9 kg)   SpO2 100%   BMI 30.21 kg/m   Wt Readings from Last 3 Encounters:  11/15/22 229 lb (103.9 kg)  08/13/22 215 lb 9.6 oz (97.8 kg)  07/15/22 214 lb (97.1 kg)    Physical Exam General appearance - alert, well appearing, young Caucasian male and in no distress Mental status -flat affect.  He answers questions appropriately. Chest - clear to auscultation, no wheezes, rales or rhonchi, symmetric air entry Heart - normal rate, regular rhythm, normal S1, S2, no murmurs, rubs, clicks or gallops Extremities - peripheral pulses normal, no pedal edema, no clubbing or cyanosis     11/15/2022    2:37 PM 08/13/2022    2:33 PM 05/05/2022   11:25 AM  Depression screen PHQ 2/9  Decreased Interest 1 1 1   Down, Depressed, Hopeless 1 1 1   PHQ - 2 Score 2 2 2   Altered sleeping 2 0 0  Tired, decreased energy 2 1 1   Change in appetite 2 1 2   Feeling bad or failure about yourself  2 0 0  Trouble concentrating 2 0 2  Moving slowly or fidgety/restless 3 1 2   Suicidal thoughts 0 0 0  PHQ-9 Score 15 5 9   Difficult doing work/chores   Not difficult at all      11/15/2022    2:38 PM 08/13/2022    2:33 PM 05/05/2022   11:26 AM 10/28/2021   10:36 AM  GAD 7 : Generalized Anxiety Score  Nervous, Anxious, on Edge 3 2 2 3   Control/stop worrying 3 1 2 3   Worry too much - different things 2 1 2 3   Trouble relaxing 3  2 1 1   Restless 3 1 2 2   Easily annoyed or irritable 3 2 1 2   Afraid - awful might happen 2 0 1 0  Total GAD 7 Score 19 9 11 14   Anxiety Difficulty   Somewhat difficult Very difficult        Latest Ref Rng & Units 07/16/2022    6:59 AM 07/04/2022    4:48 AM 07/03/2022  5:28 AM  CMP  Glucose 70 - 99 mg/dL 86  103  98   BUN 6 - 20 mg/dL 16  9  14    Creatinine 0.61 - 1.24 mg/dL 1.07  0.97  1.17   Sodium 135 - 145 mmol/L 143  139  144   Potassium 3.5 - 5.1 mmol/L 4.1  3.6  3.6   Chloride 98 - 111 mmol/L 108  104  112   CO2 22 - 32 mmol/L 29  25  25    Calcium 8.9 - 10.3 mg/dL 9.0  8.8  8.8   Total Protein 6.5 - 8.1 g/dL 6.5 - 8.1 g/dL 7.3    7.4  6.3  6.3   Total Bilirubin 0.3 - 1.2 mg/dL 0.3 - 1.2 mg/dL 0.6    0.9  1.0  1.0   Alkaline Phos 38 - 126 U/L 38 - 126 U/L 48    49  48  55   AST 15 - 41 U/L 15 - 41 U/L 49    49  36  58   ALT 0 - 44 U/L 0 - 44 U/L 39    39  32  40    Lipid Panel     Component Value Date/Time   CHOL 209 (H) 07/16/2022 0659   TRIG 385 (H) 07/16/2022 0659   HDL 59 07/16/2022 0659   CHOLHDL 3.5 07/16/2022 0659   VLDL 77 (H) 07/16/2022 0659   LDLCALC 73 07/16/2022 0659    CBC    Component Value Date/Time   WBC 5.3 07/16/2022 0659   RBC 4.97 07/16/2022 0659   HGB 14.8 07/16/2022 0659   HCT 44.8 07/16/2022 0659   PLT 220 07/16/2022 0659   MCV 90.1 07/16/2022 0659   MCH 29.8 07/16/2022 0659   MCHC 33.0 07/16/2022 0659   RDW 12.6 07/16/2022 0659   LYMPHSABS 2.7 07/02/2022 0330   MONOABS 0.5 07/02/2022 0330   EOSABS 0.1 07/02/2022 0330   BASOSABS 0.0 07/02/2022 0330    ASSESSMENT AND PLAN: 1. Essential hypertension At goal Continue Lisinopril - lisinopril (ZESTRIL) 20 MG tablet; Take 1 tablet (20 mg total) by mouth daily.  Dispense: 90 tablet; Refill: 1  2. Moderate recurrent major depression ([place]) 3. GAD (generalized anxiety disorder) I was unable to locate the genetic test that he states was done by his behavioral health  provider.  I recommend that he get in contact with Darlyne Russian, PA to inquire about his results. I also recommend that he pursue appointments with his counselor and behavioral health provider through telehealth since he is unable to drive.  I have given 1 month refill on requested medications except the baclofen to give him time to set up appointments with a counselor and [place].  I did not give her refill on baclofen because I am not aware of it being indicated as a medication to decrease alcohol cravings Message sent to his [place] and mental health provider. - mirtazapine (REMERON) 30 MG tablet; Take 1 tablet (30 mg total) by mouth at bedtime.  Dispense: 90 tablet; Refill: 0  - hydrOXYzine (VISTARIL) 25 MG capsule; Take 1 capsule (25 mg total) by mouth 3 (three) times daily as needed for anxiety.  Dispense: 90 capsule; Refill: 0 - QUEtiapine (SEROQUEL) 25 MG tablet; Take 1 tablet (25 mg total) by mouth at bedtime and may repeat dose one time if needed.  Dispense: 60 tablet; Refill: 0  4. Alcohol use disorder, severe, in early remission [facility]) Encouraged him to  remain free of alcohol.     Patient was given the opportunity to ask questions.  Patient verbalized understanding of the plan and was able to repeat key elements of the plan.   This documentation was completed using Radio producer.  Any transcriptional errors are unintentional.  No orders of the defined types were placed in this encounter.    Requested Prescriptions    No prescriptions requested or ordered in this encounter    No follow-ups on file.  Karle Plumber, MD, FACP

## 2022-11-15 NOTE — Patient Instructions (Signed)
I have given a one month refill on several of your psychiatric medications.  I strongly advised that you get in contact with your behavioral health provider to request video visits since you are unable to go to in person visits.  I also recommend that you touch base with them to get the results of the genetic testing that they did.

## 2022-11-17 ENCOUNTER — Telehealth: Payer: Self-pay | Admitting: Internal Medicine

## 2022-11-17 NOTE — Telephone Encounter (Signed)
-----   Message from Court Joy, PA-C sent at 11/17/2022  2:49 PM EST ----- Regarding: Medication Beulah Gandy Thank you for FU on [place]. He has not contascted the Counselor who reached out to him nor myself. I am happy to see him virtually if he will call and schedule an appointment. ----- Message ----- From: Marcine Matar, MD Sent: 11/15/2022   6:06 PM EST To: Court Joy, PA-C; Nolon Stalls, LCSW  I saw this patient today in follow-up visit.  He was requesting refill on his psychiatric medications.  He tells me that he has moved to [place] and recently lost his license in a court ruling last month for DWI event that occurred earlier this year.  His driver's license was suspended for 1 year.  He states that he was not able to follow-up with you all because of this. I gave him 1 month refill on most of his medications except baclofen and encouraged him to touch base with both of you to consider telehealth appointments.  He was also inquiring about the result of genetic test  that was done to figure out which medication would work best for his depression/anxiety.  I told him to touch base with you about that.

## 2022-11-18 NOTE — Telephone Encounter (Signed)
Pt needs to be seen

## 2022-11-18 NOTE — Telephone Encounter (Signed)
Pt needs to make appointment

## 2022-12-04 ENCOUNTER — Other Ambulatory Visit (HOSPITAL_COMMUNITY): Payer: Self-pay | Admitting: Medical

## 2022-12-04 DIAGNOSIS — F331 Major depressive disorder, recurrent, moderate: Secondary | ICD-10-CM

## 2022-12-09 NOTE — Telephone Encounter (Signed)
This patient has been discharged from our service Am happy to help if he makes an appt with me at [facility] on [address] or in person

## 2022-12-19 ENCOUNTER — Other Ambulatory Visit (HOSPITAL_COMMUNITY): Payer: Self-pay | Admitting: Medical

## 2022-12-20 ENCOUNTER — Other Ambulatory Visit: Payer: Self-pay | Admitting: Internal Medicine

## 2022-12-20 DIAGNOSIS — F411 Generalized anxiety disorder: Secondary | ICD-10-CM

## 2022-12-23 NOTE — Telephone Encounter (Signed)
Pt needs to make an appt

## 2023-01-10 IMAGING — CR DG HAND COMPLETE 3+V*R*
3 series · 3 of 3 positions shown · non-contrast
Comparison: None.

CLINICAL DATA: Laceration with glass.  Foreign body.

EXAM:
RIGHT HAND - COMPLETE 3+ VIEW

[x hand pa right]
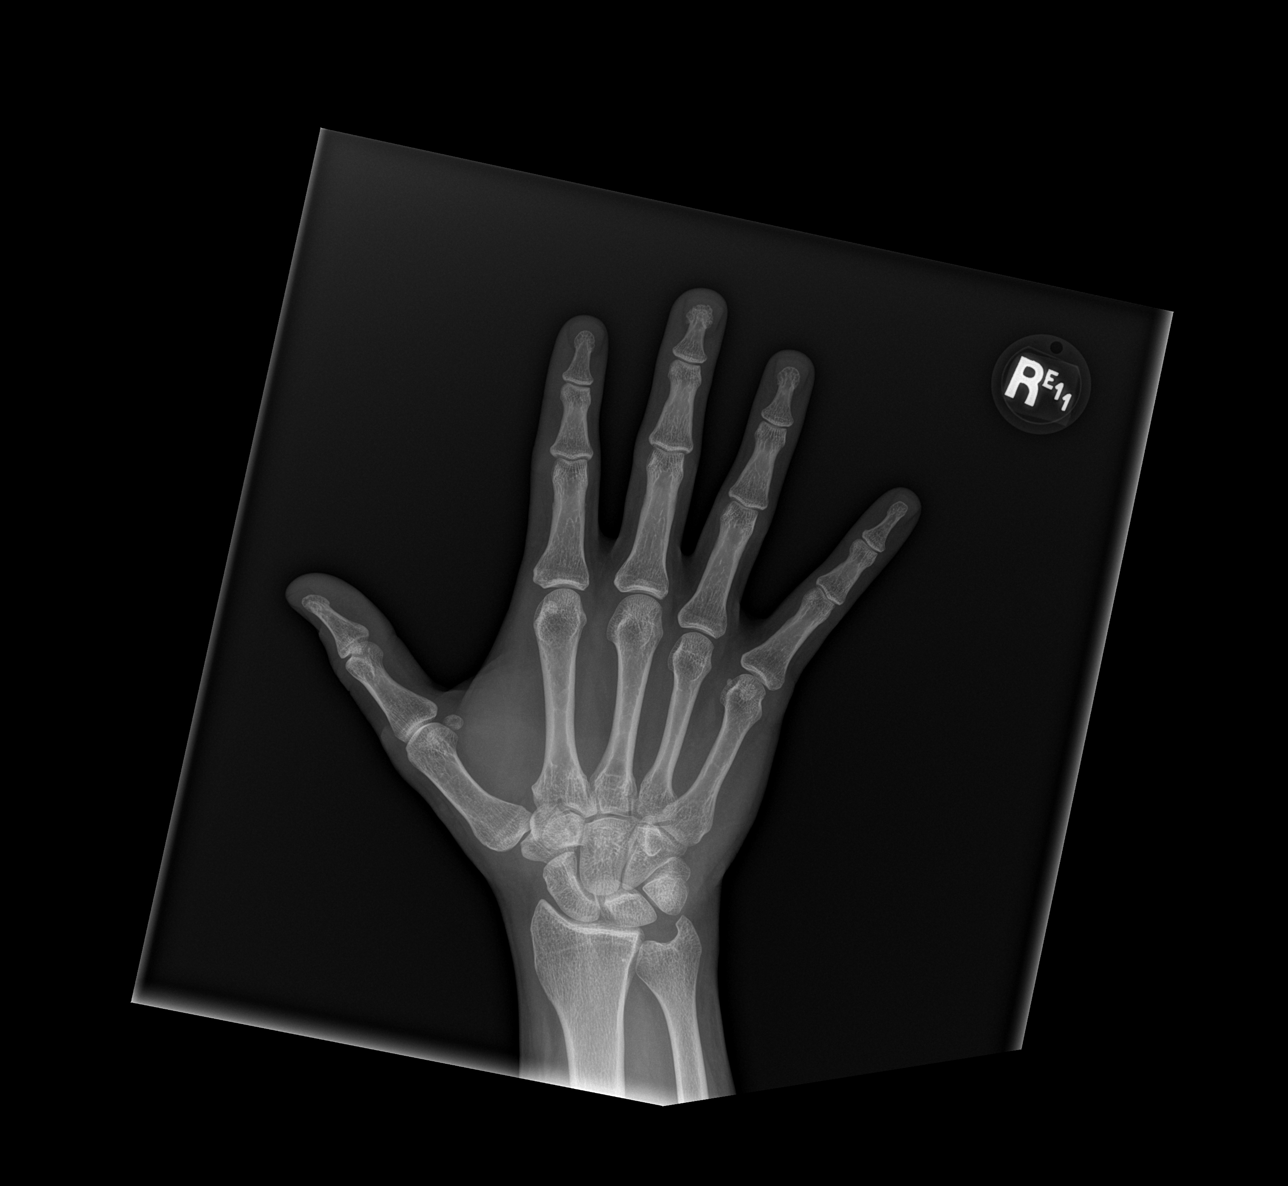

[x hand obl right]
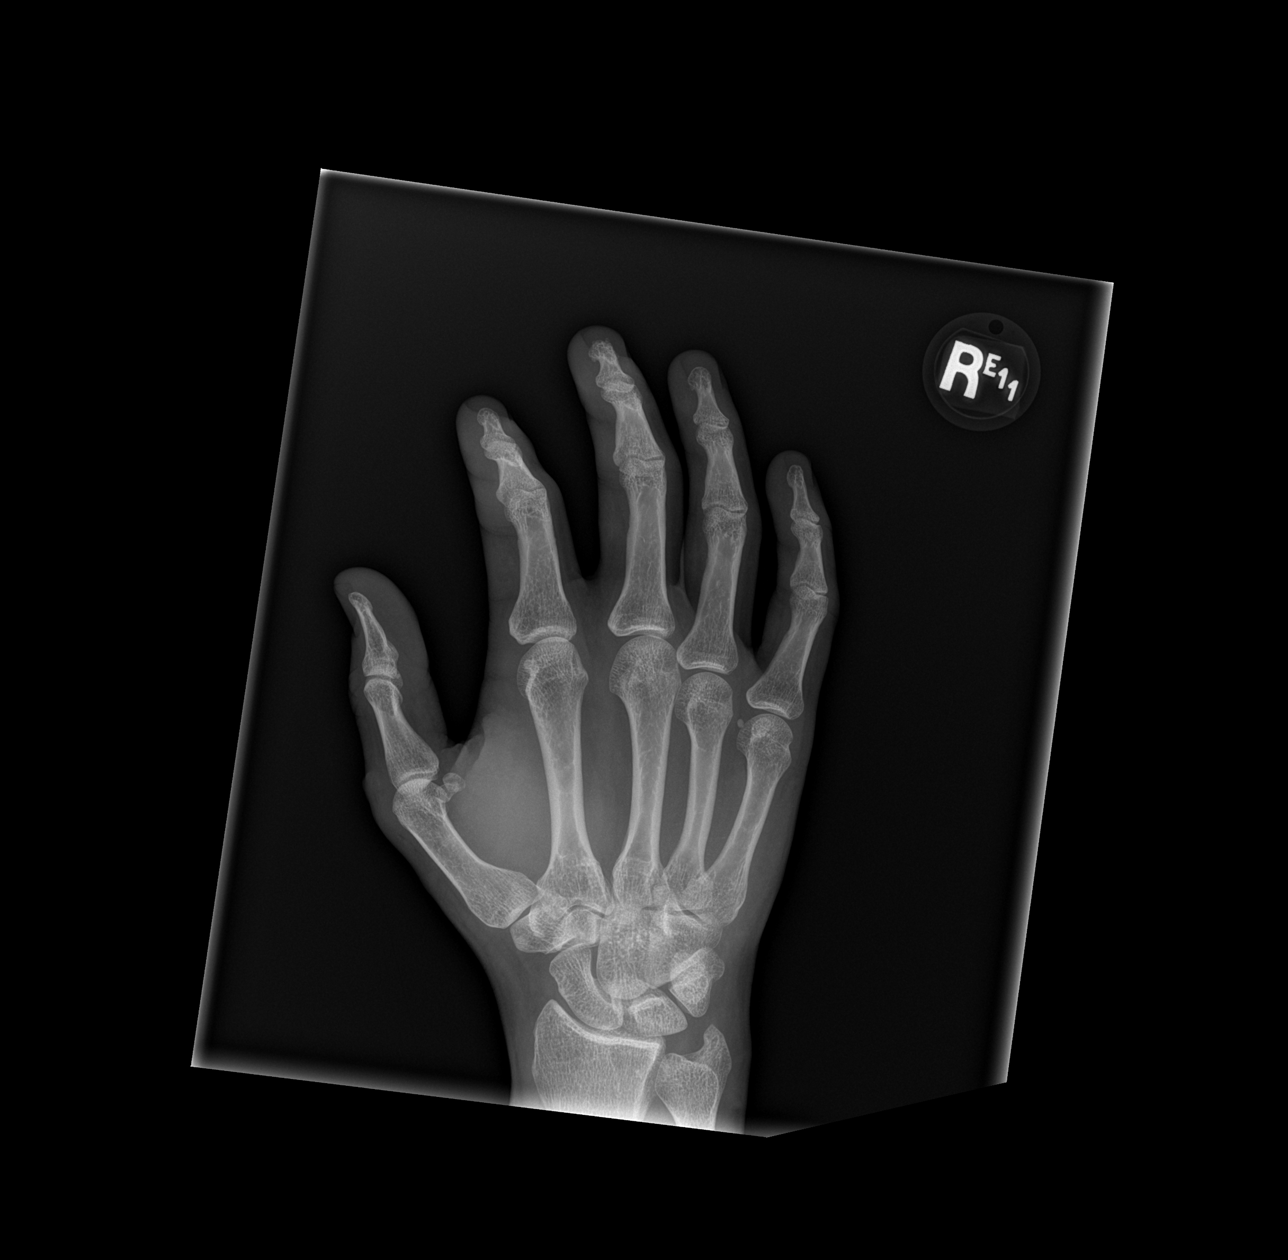

[x hand lat right]
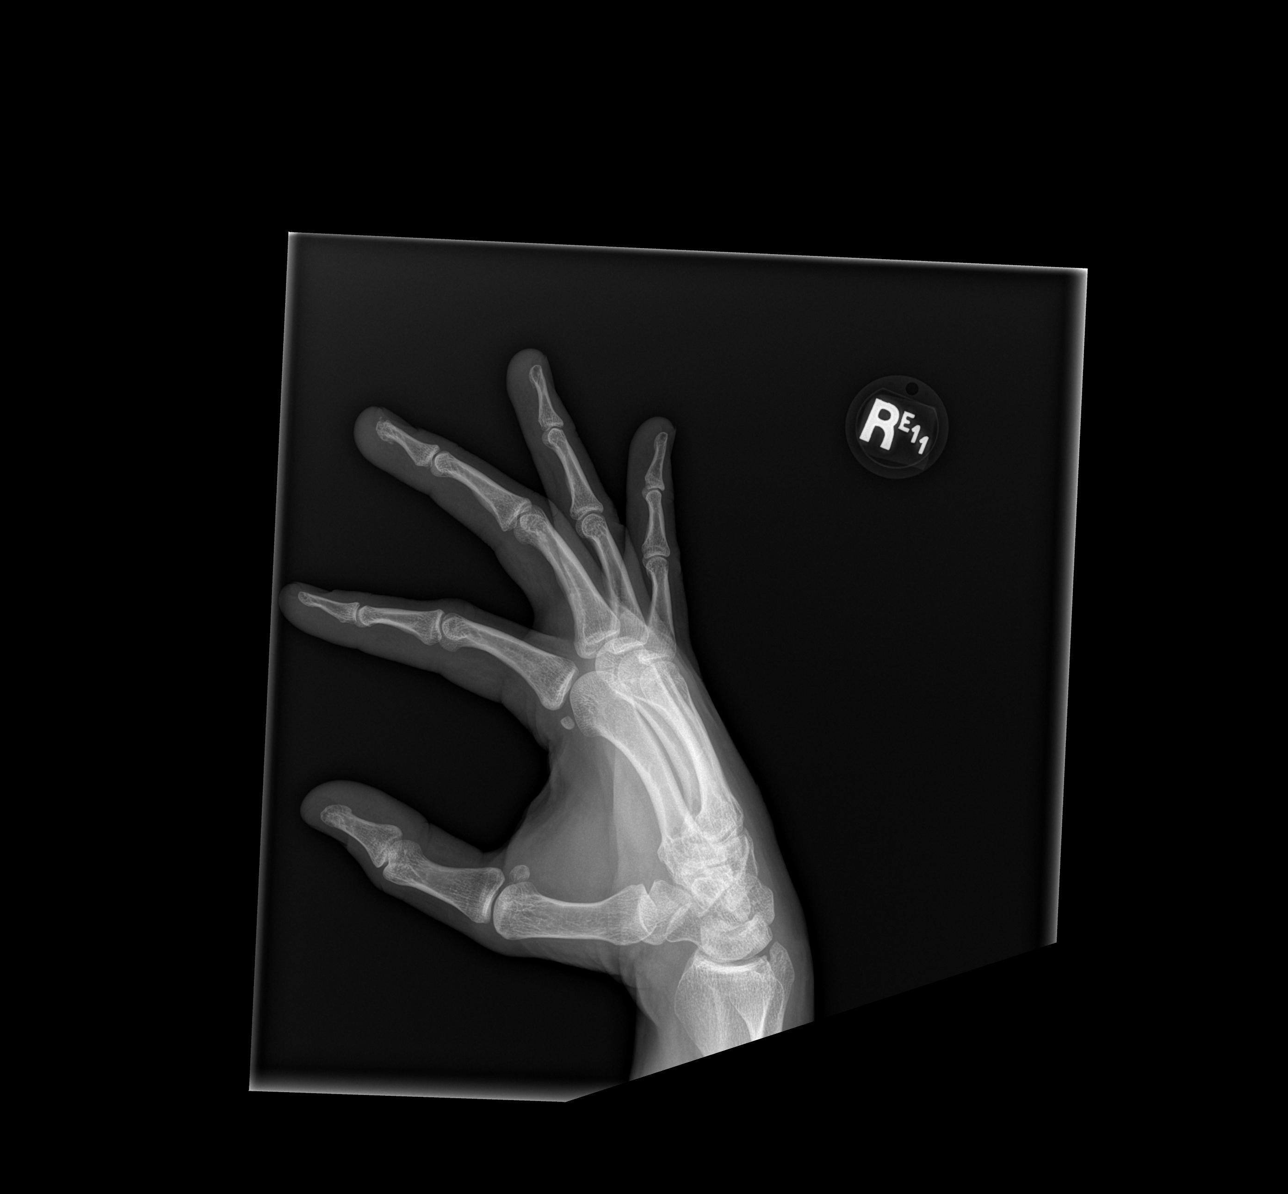

[3 of 3 positions shown; findings below may reference images not displayed]

FINDINGS: There is no evidence of fracture or dislocation. There is no
evidence of arthropathy or other focal bone abnormality. Soft
tissues are unremarkable.
IMPRESSION: Negative.

## 2023-02-18 ENCOUNTER — Other Ambulatory Visit: Payer: Self-pay | Admitting: Internal Medicine

## 2023-02-18 DIAGNOSIS — F331 Major depressive disorder, recurrent, moderate: Secondary | ICD-10-CM

## 2023-02-20 ENCOUNTER — Ambulatory Visit (HOSPITAL_COMMUNITY)
Admission: EM | Admit: 2023-02-20 | Discharge: 2023-02-21 | Disposition: A | Discharge status: Home / Self Care | Payer: 59 | Attending: Family | Admitting: Family

## 2023-02-20 DIAGNOSIS — F32A Depression, unspecified: Secondary | ICD-10-CM | POA: Insufficient documentation

## 2023-02-20 DIAGNOSIS — F411 Generalized anxiety disorder: Secondary | ICD-10-CM | POA: Insufficient documentation

## 2023-02-20 DIAGNOSIS — F1994 Other psychoactive substance use, unspecified with psychoactive substance-induced mood disorder: Secondary | ICD-10-CM | POA: Diagnosis not present

## 2023-02-20 DIAGNOSIS — F1024 Alcohol dependence with alcohol-induced mood disorder: Secondary | ICD-10-CM | POA: Diagnosis not present

## 2023-02-20 DIAGNOSIS — Z79899 Other long term (current) drug therapy: Secondary | ICD-10-CM | POA: Insufficient documentation

## 2023-02-20 DIAGNOSIS — Z1152 Encounter for screening for COVID-19: Secondary | ICD-10-CM | POA: Insufficient documentation

## 2023-02-20 LAB — CBC WITH DIFFERENTIAL/PLATELET
Abs Immature Granulocytes: 0.03 10*3/uL (ref 0.00–0.07)
Basophils Absolute: 0.1 10*3/uL (ref 0.0–0.1)
Basophils Relative: 1 %
Eosinophils Absolute: 0 10*3/uL (ref 0.0–0.5)
Eosinophils Relative: 1 %
HCT: 38.5 % — ABNORMAL LOW (ref 39.0–52.0)
Hemoglobin: 13.6 g/dL (ref 13.0–17.0)
Immature Granulocytes: 1 %
Lymphocytes Relative: 36 %
Lymphs Abs: 2.3 10*3/uL (ref 0.7–4.0)
MCH: 29.6 pg (ref 26.0–34.0)
MCHC: 35.3 g/dL (ref 30.0–36.0)
MCV: 83.7 fL (ref 80.0–100.0)
Monocytes Absolute: 0.5 10*3/uL (ref 0.1–1.0)
Monocytes Relative: 8 %
Neutro Abs: 3.4 10*3/uL (ref 1.7–7.7)
Neutrophils Relative %: 53 %
Platelets: 158 10*3/uL (ref 150–400)
RBC: 4.6 MIL/uL (ref 4.22–5.81)
RDW: 12.4 % (ref 11.5–15.5)
WBC: 6.3 10*3/uL (ref 4.0–10.5)
nRBC: 0 % (ref 0.0–0.2)

## 2023-02-20 LAB — COMPREHENSIVE METABOLIC PANEL
ALT: 17 U/L (ref 0–44)
AST: 20 U/L (ref 15–41)
Albumin: 4 g/dL (ref 3.5–5.0)
Alkaline Phosphatase: 53 U/L (ref 38–126)
Anion gap: 10 (ref 5–15)
BUN: 10 mg/dL (ref 6–20)
CO2: 23 mmol/L (ref 22–32)
Calcium: 9.1 mg/dL (ref 8.9–10.3)
Chloride: 106 mmol/L (ref 98–111)
Creatinine, Ser: 1.16 mg/dL (ref 0.61–1.24)
GFR, Estimated: >60 mL/min (ref >60)
Glucose, Bld: 80 mg/dL (ref 70–99)
Potassium: 3.9 mmol/L (ref 3.5–5.1)
Sodium: 139 mmol/L (ref 135–145)
Total Bilirubin: 0.8 mg/dL (ref 0.3–1.2)
Total Protein: 6.4 g/dL — ABNORMAL LOW (ref 6.5–8.1)

## 2023-02-20 LAB — RESP PANEL BY RT-PCR (RSV, FLU A&B, COVID)  RVPGX2
Influenza A by PCR: NEGATIVE
Influenza B by PCR: NEGATIVE
Resp Syncytial Virus by PCR: NEGATIVE
SARS Coronavirus 2 by RT PCR: NEGATIVE

## 2023-02-20 LAB — POCT URINE DRUG SCREEN - MANUAL ENTRY (I-SCREEN)
POC Amphetamine UR: NOT DETECTED
POC Buprenorphine (BUP): NOT DETECTED
POC Cocaine UR: NOT DETECTED
POC Marijuana UR: NOT DETECTED
POC Methadone UR: NOT DETECTED
POC Methamphetamine UR: NOT DETECTED
POC Morphine: NOT DETECTED
POC Oxazepam (BZO): POSITIVE — AB
POC Oxycodone UR: NOT DETECTED
POC Secobarbital (BAR): NOT DETECTED

## 2023-02-20 LAB — ETHANOL: Alcohol, Ethyl (B): 187 mg/dL — ABNORMAL HIGH (ref <10)

## 2023-02-20 LAB — POC SARS CORONAVIRUS 2 AG: SARSCOV2ONAVIRUS 2 AG: NEGATIVE

## 2023-02-20 MED ORDER — LORAZEPAM 1 MG PO TABS
1.0000 mg | ORAL_TABLET | Freq: Four times a day (QID) | ORAL | Status: DC | PRN
  Administered 2023-02-20: 1 mg via ORAL
  Filled 2023-02-20: qty 1

## 2023-02-20 MED ORDER — LOPERAMIDE HCL 2 MG PO CAPS: 2.0000 mg | ORAL_CAPSULE | ORAL | Status: DC | PRN

## 2023-02-20 MED ORDER — MAGNESIUM HYDROXIDE 400 MG/5ML PO SUSP: 30.0000 mL | Freq: Every day | ORAL | Status: DC | PRN

## 2023-02-20 MED ORDER — ALUM & MAG HYDROXIDE-SIMETH 200-200-20 MG/5ML PO SUSP: 30.0000 mL | ORAL | Status: DC | PRN

## 2023-02-20 MED ORDER — ACETAMINOPHEN 325 MG PO TABS: 650.0000 mg | ORAL_TABLET | Freq: Four times a day (QID) | ORAL | Status: DC | PRN

## 2023-02-20 MED ORDER — TRAZODONE HCL 50 MG PO TABS
50.0000 mg | ORAL_TABLET | Freq: Every evening | ORAL | Status: DC | PRN
  Administered 2023-02-20: 50 mg via ORAL
  Filled 2023-02-20: qty 1

## 2023-02-20 MED ORDER — QUETIAPINE FUMARATE 25 MG PO TABS
25.0000 mg | ORAL_TABLET | Freq: Every day | ORAL | Status: DC
  Administered 2023-02-20: 25 mg via ORAL
  Filled 2023-02-20: qty 1

## 2023-02-20 MED ORDER — CHLORDIAZEPOXIDE HCL 25 MG PO CAPS: 25.0000 mg | ORAL_CAPSULE | Freq: Four times a day (QID) | ORAL | Status: DC | PRN

## 2023-02-20 MED ORDER — THIAMINE MONONITRATE 100 MG PO TABS: 100.0000 mg | ORAL_TABLET | Freq: Every day | ORAL | Status: DC

## 2023-02-20 MED ORDER — HYDROXYZINE HCL 25 MG PO TABS
25.0000 mg | ORAL_TABLET | Freq: Four times a day (QID) | ORAL | Status: DC | PRN
  Administered 2023-02-21: 25 mg via ORAL
  Filled 2023-02-20: qty 1

## 2023-02-20 MED ORDER — MIRTAZAPINE 30 MG PO TABS
30.0000 mg | ORAL_TABLET | Freq: Every day | ORAL | Status: DC
  Administered 2023-02-20: 30 mg via ORAL
  Filled 2023-02-20: qty 1

## 2023-02-20 MED ORDER — ADULT MULTIVITAMIN W/MINERALS CH
1.0000 | ORAL_TABLET | Freq: Every day | ORAL | Status: DC
  Administered 2023-02-20 – 2023-02-21 (×2): 1 via ORAL
  Filled 2023-02-20 (×2): qty 1

## 2023-02-20 MED ORDER — QUETIAPINE FUMARATE 25 MG PO TABS: 25.0000 mg | ORAL_TABLET | Freq: Every evening | ORAL | Status: DC | PRN

## 2023-02-20 MED ORDER — THIAMINE MONONITRATE 100 MG PO TABS
100.0000 mg | ORAL_TABLET | Freq: Every day | ORAL | Status: DC
  Administered 2023-02-21: 100 mg via ORAL
  Filled 2023-02-20: qty 1

## 2023-02-20 MED ORDER — ONDANSETRON 4 MG PO TBDP: 4.0000 mg | ORAL_TABLET | Freq: Four times a day (QID) | ORAL | Status: DC | PRN

## 2023-02-20 MED ORDER — THIAMINE HCL 100 MG/ML IJ SOLN
100.0000 mg | Freq: Once | INTRAMUSCULAR | Status: AC
  Administered 2023-02-20: 100 mg via INTRAMUSCULAR
  Filled 2023-02-20: qty 2

## 2023-02-20 NOTE — ED Notes (Signed)
Patient observed/assessed at bedside. Patient's eye contact is minimal, he is seemingly disoriented/intoxicated, eyes drifting, and speech is low/slow.  Patient alert and oriented x 4. Affect is Flat. Patient denies pain and anxiety. He denies A/V/H. He denies having any thoughts/plan of self harm and harm towards others. Fluid and snack offered. Patient states that appetite has been good throughout the day.  Verbalizes no further complaints at this time. Will continue to monitor and support.

## 2023-02-20 NOTE — ED Provider Notes (Signed)
[facility] Urgent Care Continuous Assessment Admission H&P  Date: 02/20/23 Patient Name: [name] MRN: [id number] Chief Complaint: " I need help detox from benzo's"  Diagnoses:  Final diagnoses:  Alcohol dependence with alcohol-induced mood disorder [facility])    HPI: [name] is a 35 Caucasian male presents to [facility] urgent care requesting detox from benzodiazepines.  He reports he does have active prescriptions for Ativan however, has been using Xanax for the past 4 to 5 months that he reports he has been buying "xanax bars" off the streets.  Coupled with drinking's 5-6 beers daily.  He denied any other illicit drug use to include cocaine heroin methamphetamine or marijuana.  He denied suicidal or homicidal ideations.  Denied auditory or visual hallucinations. "  I detox, on about a month ago that did not want to experience the side effects."   [name] reports he is followed by therapy and psychiatry and is followed by Pierce Crane at [facility] states he has an upcoming appointment with his therapist this week Wednesday.  Reports previous inpatient admission for his alcohol abuse. Charted history with alcohol withdraw seizures. Denied that he has experienced delirium tremors.  Orders placed for CIWA protocol.  Will consider changing to Librium detox protocol after lab results.  Recommend overnight observation.  Patient to consider Facility Based Crisis admission.  [name] is sitting in no acute distress. Patient appears inebriated as he reports drinking 6 beers today.  Slightly slurred speech, malodorous alcohol with unsteady gait he is alert/oriented x 4; calm/cooperative; and mood congruent with affect. he is speaking in a clear tone at moderate volume, and normal pace; with good eye contact.his  thought process is coherent and relevant; There is no indication that he is currently responding to internal/external stimuli or experiencing delusional thought content; and he has denied  suicidal/self-harm/homicidal ideation, psychosis, and paranoia.  Patient has remained calm throughout assessment and has answered questions appropriately.     Total Time spent with patient: 15 minutes  Musculoskeletal  Strength & Muscle Tone: within normal limits Gait & Station: normal Patient leans: N/A  Psychiatric Specialty Exam  Presentation General Appearance:  Appropriate for Environment  Eye Contact: Fair  Speech: Clear and Coherent  Speech Volume: Normal  Handedness: Right   Mood and Affect  Mood: Anxious  Affect: Appropriate; Congruent   Thought Process  Thought Processes: Coherent  Descriptions of Associations:Intact  Orientation:Full (Time, Place and Person)  Thought Content:Logical  Diagnosis of Schizophrenia or Schizoaffective disorder in past: No data recorded  Hallucinations:No data recorded Ideas of Reference:None  Suicidal Thoughts:No data recorded Homicidal Thoughts:No data recorded  Sensorium  Memory: Immediate Fair; Recent Fair; Remote Fair  Judgment: Fair  Insight: Fair   Community education officer  Concentration: Good  Attention Span: Good  Recall: [place] of Knowledge: Fair  Language: Good   Psychomotor Activity  Psychomotor Activity:No data recorded  Assets  Assets: Communication Skills; Resilience; Physical Health   Sleep  Sleep:No data recorded  No data recorded  Physical Exam Cardiovascular:     Rate and Rhythm: Normal rate and regular rhythm.  Pulmonary:     Effort: Pulmonary effort is normal.     Breath sounds: Normal breath sounds.  Neurological:     Mental Status: He is alert and oriented to person, place, and time.  Psychiatric:        Mood and Affect: Mood normal.        Behavior: Behavior normal.  Thought Content: Thought content normal.    Review of Systems  Respiratory: Negative.    Cardiovascular: Negative.   Psychiatric/Behavioral:  Positive for depression. Negative  for suicidal ideas. The patient is not nervous/anxious.   All other systems reviewed and are negative.   Blood pressure 113/71, pulse 88, temperature 98.6 F (37 C), temperature source Oral, resp. rate 17, SpO2 97 %. There is no height or weight on file to calculate BMI.  Past Psychiatric History: with generalized  anxiety disorder.   Is the patient at risk to self? No  Has the patient been a risk to self in the past 6 months? No .    Has the patient been a risk to self within the distant past? No   Is the patient a risk to others? No   Has the patient been a risk to others in the past 6 months? No   Has the patient been a risk to others within the distant past? 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  Latest known visit with results is:  Admission on 07/15/2022, Discharged on 07/21/2022  Component Date Value Ref Range Status   SARS Coronavirus 2 by RT PCR 07/15/2022 NEGATIVE  NEGATIVE Final   Comment: (NOTE) SARS-CoV-2 target nucleic acids are NOT DETECTED.  The SARS-CoV-2 RNA is generally detectable in upper and lower respiratory specimens during the acute phase of infection. The lowest concentration of SARS-CoV-2 viral copies this assay can detect is 250 copies / mL. A negative result does not preclude SARS-CoV-2 infection and should not be used as the sole basis for treatment or other patient management decisions.  A negative result may occur with improper specimen collection / handling, submission of specimen other than nasopharyngeal swab, presence of viral mutation(s) within the areas targeted by this assay, and inadequate number of viral copies (<250 copies / mL). A negative result must be combined with clinical observations, patient history, and epidemiological information.  Fact Sheet for Patients:    https://www.patel.info/  Fact Sheet for Healthcare Providers: https://hall.com/  This test is not yet approved or                           cleared by the [place] FDA and has been authorized for detection and/or diagnosis of SARS-CoV-2 by FDA under an Emergency Use Authorization (EUA).  This EUA will remain in effect (meaning this test can be used) for the duration of the COVID-19 declaration under Section 564(b)(1) of the Act, 21 U.S.C. section 360bbb-3(b)(1), unless the authorization is terminated or revoked sooner.  Performed at [facility], [place] [address], [place], [place] [address]    WBC 07/16/2022 5.3  4.0 - 10.5 K/uL Final   RBC 07/16/2022 4.97  4.22 - 5.81 MIL/uL Final   Hemoglobin 07/16/2022 14.8  13.0 - 17.0 g/dL Final   HCT 07/16/2022 44.8  39.0 - 52.0 % Final   MCV 07/16/2022 90.1  80.0 - 100.0 fL Final   MCH 07/16/2022 29.8  26.0 - 34.0 pg Final   MCHC 07/16/2022 33.0  30.0 - 36.0 g/dL Final   RDW 07/16/2022 12.6  11.5 - 15.5 % Final   Platelets 07/16/2022 220  150 - 400 K/uL Final   nRBC 07/16/2022 0.0  0.0 - 0.2 % Final   Performed  at [facility], [place] [address] [place], [place] [address]   Sodium 07/16/2022 143  135 - 145 mmol/L Final   Potassium 07/16/2022 4.1  3.5 - 5.1 mmol/L Final   Chloride 07/16/2022 108  98 - 111 mmol/L Final   CO2 07/16/2022 29  22 - 32 mmol/L Final   Glucose, Bld 07/16/2022 86  70 - 99 mg/dL Final   Glucose reference range applies only to samples taken after fasting for at least 8 hours.   BUN 07/16/2022 16  6 - 20 mg/dL Final   Creatinine, Ser 07/16/2022 1.07  0.61 - 1.24 mg/dL Final   Calcium 07/16/2022 9.0  8.9 - 10.3 mg/dL Final   Total Protein 07/16/2022 7.3  6.5 - 8.1 g/dL Final   Albumin 07/16/2022 3.9  3.5 - 5.0 g/dL Final   AST 07/16/2022 49 (H)  15 - 41 U/L Final   ALT 07/16/2022 39  0 - 44 U/L Final   Alkaline Phosphatase 07/16/2022  48  38 - 126 U/L Final   Total Bilirubin 07/16/2022 0.6  0.3 - 1.2 mg/dL Final   GFR, Estimated 07/16/2022 >60  >60 mL/min Final   Comment: (NOTE) Calculated using the CKD-EPI Creatinine Equation (2021)    Anion gap 07/16/2022 6  5 - 15 Final   Performed at [facility], [place] [address], [place], [place] [address]   Hgb A1c MFr Bld 07/16/2022 4.7 (L)  4.8 - 5.6 % Final   Comment: (NOTE) Pre diabetes:          5.7%-6.4%  Diabetes:              >6.4%  Glycemic control for   <7.0% adults with diabetes    Mean Plasma Glucose 07/16/2022 88.19  mg/dL Final   Performed at [facility], [place] [address] [place], [place] [address]   Cholesterol 07/16/2022 209 (H)  0 - 200 mg/dL Final   Triglycerides 07/16/2022 385 (H)  <150 mg/dL Final   HDL 07/16/2022 59  >40 mg/dL Final   Total CHOL/HDL Ratio 07/16/2022 3.5  RATIO Final   VLDL 07/16/2022 77 (H)  0 - 40 mg/dL Final   LDL Cholesterol 07/16/2022 73  0 - 99 mg/dL Final   Comment:        Total Cholesterol/HDL:CHD Risk Coronary Heart Disease Risk Table                     Men   Women  1/2 Average Risk   3.4   3.3  Average Risk       5.0   4.4  2 X Average Risk   9.6   7.1  3 X Average Risk  23.4   11.0        Use the calculated Patient Ratio above and the CHD Risk Table to determine the patient's CHD Risk.        ATP III CLASSIFICATION (LDL):  <100     mg/dL   Optimal  100-129  mg/dL   Near or Above                    Optimal  130-159  mg/dL   Borderline  160-189  mg/dL   High  >190     mg/dL   Very High Performed at [facility] [address], [place], [place] [address]    TSH 07/16/2022 1.953  0.350 - 4.500 uIU/mL Final   Comment: Performed  by a 3rd Generation assay with a functional sensitivity of <=0.01 uIU/mL. Performed at [facility], [place] [address], [place], [place] [address]    Total Protein 07/16/2022 7.4  6.5 - 8.1 g/dL Final   Albumin 07/16/2022 4.2  3.5  - 5.0 g/dL Final   AST 07/16/2022 49 (H)  15 - 41 U/L Final   ALT 07/16/2022 39  0 - 44 U/L Final   Alkaline Phosphatase 07/16/2022 49  38 - 126 U/L Final   Total Bilirubin 07/16/2022 0.9  0.3 - 1.2 mg/dL Final   Bilirubin, Direct 07/16/2022 0.1  0.0 - 0.2 mg/dL Final   Indirect Bilirubin 07/16/2022 0.8  0.3 - 0.9 mg/dL Final   Performed at [facility], [place] [address], [place], [place] [address]   Color, Urine 07/15/2022 YELLOW  YELLOW Final   APPearance 07/15/2022 CLEAR  CLEAR Final   Specific Gravity, Urine 07/15/2022 1.019  1.005 - 1.030 Final   pH 07/15/2022 6.0  5.0 - 8.0 Final   Glucose, UA 07/15/2022 NEGATIVE  NEGATIVE mg/dL Final   Hgb urine dipstick 07/15/2022 NEGATIVE  NEGATIVE Final   Bilirubin Urine 07/15/2022 NEGATIVE  NEGATIVE Final   Ketones, ur 07/15/2022 NEGATIVE  NEGATIVE mg/dL Final   Protein, ur 07/15/2022 NEGATIVE  NEGATIVE mg/dL Final   Nitrite 07/15/2022 NEGATIVE  NEGATIVE Final   Leukocytes,Ua 07/15/2022 NEGATIVE  NEGATIVE Final   RBC / HPF 07/15/2022 0-5  0 - 5 RBC/hpf Final   WBC, UA 07/15/2022 0-5  0 - 5 WBC/hpf Final   Bacteria, UA 07/15/2022 RARE (A)  NONE SEEN Final   Mucus 07/15/2022 PRESENT   Final   Performed at [facility], [place] [address] [place], [place] [address]   Opiates 07/15/2022 NONE DETECTED  NONE DETECTED Final   Cocaine 07/15/2022 NONE DETECTED  NONE DETECTED Final   Benzodiazepines 07/15/2022 POSITIVE (A)  NONE DETECTED Final   Amphetamines 07/15/2022 NONE DETECTED  NONE DETECTED Final   Tetrahydrocannabinol 07/15/2022 NONE DETECTED  NONE DETECTED Final   Barbiturates 07/15/2022 NONE DETECTED  NONE DETECTED Final   Comment: (NOTE) DRUG SCREEN FOR MEDICAL PURPOSES ONLY.  IF CONFIRMATION IS NEEDED FOR ANY PURPOSE, NOTIFY LAB WITHIN 5 DAYS.  LOWEST DETECTABLE LIMITS FOR URINE DRUG SCREEN Drug Class                     Cutoff (ng/mL) Amphetamine and metabolites    1000 Barbiturate and metabolites     200 Benzodiazepine                 [id number] Tricyclics and metabolites     300 Opiates and metabolites        300 Cocaine and metabolites        300 THC                            50 Performed at [facility], [place] [address] [place], [place] [address]     Allergies: Wellbutrin [bupropion], Benadryl [diphenhydramine], and Codeine  Medications:  PTA Medications  Medication Sig   fluticasone (FLONASE) 50 MCG/ACT nasal spray Place 1 spray into both nostrils 2 (two) times daily as needed for allergies.   traZODone (DESYREL) 50 MG tablet Take 1 tablet (50 mg total) by mouth 60 (sixty) minutes before procedure for 1 dose.   lisinopril (ZESTRIL) 20 MG tablet Take 1 tablet (20 mg total) by mouth daily.  mirtazapine (REMERON) 30 MG tablet Take 1 tablet (30 mg total) by mouth at bedtime.   hydrOXYzine (VISTARIL) 25 MG capsule Take 1 capsule (25 mg total) by mouth 3 (three) times daily as needed for anxiety.   QUEtiapine (SEROQUEL) 25 MG tablet Take 1 tablet (25 mg total) by mouth at bedtime and may repeat dose one time if needed.   Facility Ordered Medications  Medication   acetaminophen (TYLENOL) tablet 650 mg   alum & mag hydroxide-simeth (MAALOX/MYLANTA) 200-200-20 MG/5ML suspension 30 mL   magnesium hydroxide (MILK OF MAGNESIA) suspension 30 mL   traZODone (DESYREL) tablet 50 mg   thiamine (VITAMIN B1) injection 100 mg   [START ON 02/21/2023] thiamine (VITAMIN B1) tablet 100 mg   multivitamin with minerals tablet 1 tablet   LORazepam (ATIVAN) tablet 1 mg   hydrOXYzine (ATARAX) tablet 25 mg   loperamide (IMODIUM) capsule 2-4 mg   ondansetron (ZOFRAN-ODT) disintegrating tablet 4 mg    Medical Decision Making  Will consider Facility Based Crisis -Pending lab results and BAL level -Consider initiating Librium protocol after CMP results.    Recommendations  Based on my evaluation the patient does not appear to have an emergency medical condition.  Derrill Center,  NP 02/20/23  3:15 PM

## 2023-02-20 NOTE — ED Notes (Signed)
Patient observed/assessed in bed/chair resting quietly appearing in no distress and verbalizing no complaints at this time. Will continue to monitor.  

## 2023-02-20 NOTE — ED Notes (Signed)
Patients blood pressure was 99/54 pulse 79 then it was rechecked 110/57 pulse 76. Patient denies any s/s of hypotension. States that he takes bp medication daily. Rn notified provider. Providers states to get the patient to drink fluids. Provider also states to give the patient Gatorade.Gatorade was given.

## 2023-02-20 NOTE — ED Notes (Signed)
Pt  admitted to obs for detox from benzo.Denies SI/HI/AVH. Calm, cooperative throughout interview process. Skin assessment completed. Oriented to unit. Meal and drink offered. At [place], pt continue to deniesSI/HI/AVH. Pt verbally contract for safety. Will monitor for safety.

## 2023-02-20 NOTE — BH Assessment (Signed)
Comprehensive Clinical Assessment (CCA) Note  02/20/2023 [name] [id number]  Chief Complaint:  Chief Complaint  Patient presents with   Addiction Problem    Disposition: Per Glenetta Hew,, NP patient needs inpatient for observation and continuous assessment for detox.  The patient demonstrates the following risk factors for suicide: Chronic risk factors for suicide include: substance use disorder. Acute risk factors for suicide include: family or marital conflict. Protective factors for this patient include: positive social support and responsibility to others (children, family). Considering these factors, the overall suicide risk at this point appears to be low. Patient is appropriate for outpatient follow up.   Patient presents voluntarily to [facility] due to mixing Xanax with alcohol. Patient has a history of alcohol use disorder, along with mild depression and anxiety.  Patient reports medication of  trazodone, Remeron, vistaril and Seroquel. Patient denies,  suicidal/homicidal ideation of any auditory/ visual hallucinations.  Patient currently lives with his parents and 67 year old daughter. Patient denies a history of abuse and trauma. Patient works as a Actor.   His insight and judgment are fair .  Patient denies hving any legal issues.   Protective factors against suicide include good family support, no current suicidal ideation, future orientation, therapeutic relationship, no access to firearms, no current psychotic symptoms, and no prior attempts.   Patient's outpatient history includes seeing Amy Emogene Morgan for medication and Marlowe Kays at an agency in [place] for therapy   Patient reports/denies alcohol/substance abuse.   MSE: patient is casually dressed, alert,  oriented x4, gait is staggered. Speech is slow and slurred. Eye contact is good. Patient's mood and affect are congruent. Thought process is coherent and relevant. There is no indication patient is currently responding to  internal stimuli or experiencing delusional thought content. Patient was cooperative throughout assessment.     Visit Diagnosis:  Alcohol dependence with alcohol-induced mood disorder (HCC)     CCA Screening, Triage and Referral (STR)  Patient Reported Information How did you hear about [place]? Self  What Is the Reason for Your Visit/Call Today? Addiction Problem  How Long Has This Been Causing You Problems? 1 wk - 1 month  What Do You Feel Would Help You the Most Today? Alcohol or Drug Use Treatment; Treatment for Depression or other mood problem   Have You Recently Had Any Thoughts About Hurting Yourself? No  Are You Planning to Commit Suicide/Harm Yourself At This time? No   Flowsheet Row ED from 02/20/2023 in [facility] Most recent reading at 02/20/2023  4:16 PM ED from 09/14/2022 in [facility] Emergency Department at [facility] Most recent reading at 09/14/2022  6:47 PM OP Visit from 09/14/2022 in [place] Most recent reading at 09/14/2022  5:04 PM  C-SSRS RISK CATEGORY No Risk No Risk No Risk       Have you Recently Had Thoughts About [place]? No  Are You Planning to Harm Someone at This Time? No  Explanation: NA   Have You Used Any Alcohol or Drugs in the Past 24 Hours? Yes  What Did You Use and How Much? Xanax, liqour, 2 or 3oz   Do You Currently Have a Therapist/Psychiatrist? No data recorded Name of Therapist/Psychiatrist: Name of Therapist/Psychiatrist: [place] at agency in [place] Pt. was unable to remember   Have You Been Recently Discharged From Any Office Practice or Programs? No  Explanation of Discharge From Practice/Program: NA     CCA Screening Triage Referral  Assessment Type of Contact: Face-to-Face  Telemedicine Service Delivery:   Is this Initial or Reassessment?   Date Telepsych consult ordered in [facility]:    Time Telepsych consult ordered in [facility]:     Location of Assessment: [facility]  Provider Location: [facility] Assessment Services   Collateral Involvement: None   Does Patient Have a Stage manager Guardian? No  Legal Guardian Contact Information: NA (no legal guardian)  Copy of Legal Guardianship Form: -- (NA)  Legal Guardian Notified of Arrival: -- (NA)  Legal Guardian Notified of Pending Discharge: -- (NA)  If Minor and Not Living with Parent(s), Who has Custody? NA  Is CPS involved or ever been involved? Never  Is APS involved or ever been involved? Never   Patient Determined To Be At Risk for Harm To Self or Others Based on Review of Patient Reported Information or Presenting Complaint? No  Method: No Plan  Availability of Means: No access or NA  Intent: Vague intent or NA  Notification Required: No need or identified person  Additional Information for Danger to Others Potential: -- (NA)  Additional Comments for Danger to Others Potential: Not a danger to others  Are There Guns or Other Weapons in Your Home? No  Types of Guns/Weapons: Pt denies having guns in the home  Are These Weapons Safely Secured?                            -- (NA)  Who Could Verify You Are Able To Have These Secured: NA  Do You Have any Outstanding Charges, Pending Court Dates, Parole/Probation? Pt. denies  Contacted To Inform of Risk of Harm To Self or Others: -- (NA)    Does Patient Present under Involuntary Commitment? No    [place] of Residence: [place]   Patient Currently Receiving the Following Services: Individual Therapy; Medication Management   Determination of Need: Urgent (48 hours)   Options For Referral: Facility-Based Crisis     CCA Biopsychosocial Patient Reported Schizophrenia/Schizoaffective Diagnosis in Past: No   Strengths: Strong-willed, determined   Mental Health Symptoms Depression:   None   Duration of Depressive symptoms:  Duration of Depressive Symptoms:  N/A   Mania:   None   Anxiety:    Difficulty concentrating; Restlessness; Sleep; Tension; Worrying   Psychosis:   None   Duration of Psychotic symptoms:    Trauma:   None   Obsessions:   None   Compulsions:   None   Inattention:   None   Hyperactivity/Impulsivity:   None   Oppositional/Defiant Behaviors:   N/A   Emotional Irregularity:   Potentially harmful impulsivity   Other Mood/Personality Symptoms:   Alcohol abuse    Mental Status Exam Appearance and self-care  Stature:   Tall   Weight:   Average weight   Clothing:   Casual   Grooming:   Normal   Cosmetic use:   None   Posture/gait:   Other (Comment) (Staggering)   Motor activity:   Slowed   Sensorium  Attention:   Normal   Concentration:   Normal   Orientation:   X5   Recall/memory:   Normal   Affect and Mood  Affect:   Congruent; Appropriate   Mood:   Anxious   Relating  Eye contact:   Normal   Facial expression:   Responsive   Attitude toward examiner:   Cooperative   Thought and Language  Speech  flow:  Slow; Slurred   Thought content:   Appropriate to Mood and Circumstances   Preoccupation:   None   Hallucinations:   None   Organization:   Coherent; Medical laboratory scientific officer of Knowledge:   Average   Intelligence:   Average   Abstraction:   Normal   Judgement:   Poor   Reality Testing:   Adequate   Insight:   Fair   Decision Making:   Impulsive   Social Functioning  Social Maturity:   Irresponsible   Social Judgement:   Normal   Stress  Stressors:   Work; Relationship (Pt is curently separated from his wife)   Coping Ability:   Advice worker Deficits:   Decision making   Supports:   Family; Friends/Service system     Religion: Religion/Spirituality Are You A Religious Person?: No  Leisure/Recreation: Leisure / Recreation Do You Have Hobbies?: Yes Leisure and Hobbies: "I like to play  the guitar, draw and skateboard.'  Exercise/Diet: Exercise/Diet Do You Exercise?: No Have You Gained or Lost A Significant Amount of Weight in the Past Six Months?: No Do You Follow a Special Diet?: No Do You Have Any Trouble Sleeping?: No   CCA Employment/Education Employment/Work Situation: Employment / Work Copywriter, advertising Work Stressors: Inconsistent do to the nature of my job Patient's Job has Been Impacted by Current Illness: No Has Patient ever Been in Passenger transport manager?: No  Education: Education Is Patient Currently Attending School?: No Last Grade Completed: 12 Did You Nutritional therapist?: Yes What Type of College Degree Do you Have?: some college but did not graduate Did You Have An Individualized Education Program (IIEP): No Did You Have Any Difficulty At School?: No Patient's Education Has Been Impacted by Current Illness: No   CCA Family/Childhood History Family and Relationship History: Family history Marital status: Separated Additional relationship information: NA Does patient have children?: Yes How many children?: 1 How is patient's relationship with their children?: Pt  reports a good withleationhip with daughter  Childhood History:  Childhood History By whom was/is the patient raised?: Both parents, Grandparents Did patient suffer any verbal/emotional/physical/sexual abuse as a child?: No Did patient suffer from severe childhood neglect?: No Has patient ever been sexually abused/assaulted/raped as an adolescent or adult?: No Was the patient ever a victim of a crime or a disaster?: No Witnessed domestic violence?: No Has patient been affected by domestic violence as an adult?: No       CCA Substance Use Alcohol/Drug Use: Alcohol / Drug Use Pain Medications: See MAR Prescriptions: See MAR Over the Counter: See MAR History of alcohol / drug use?: Yes Longest period of sobriety (when/how long): pt reports several months but is vague as to what the time frame  is Negative Consequences of Use: Personal relationships, Work / Youth worker Withdrawal Symptoms: Tremors, Sweats Substance #1 Name of Substance 1: xanax 1 - Age of First Use: 21 1 - Amount (size/oz): 1-2  12 mg 1 - Frequency: Daily 1 - Duration: Since age 57 1 - Last Use / Amount: today 1 - Method of Aquiring: purchase on the streets 1- Route of Use: oral Substance #2 Name of Substance 2: methamphetamine 2 - Age of First Use: 30 2 - Amount (size/oz): amount unknown 2 - Frequency: daily 2 - Duration: 1 year 2 - Last Use / Amount: i year ago 2 - Method of Aquiring: purchaed through friends 2 - Route of Substance Use: snort Substance #3 Name of Substance  3: cocaine 3 - Age of First Use: 22 3 - Amount (size/oz): unknown 3 - Frequency: 1 day a week 3 - Duration: several years 3 - Last Use / Amount: lst used approx 3 months age/amount unknowwn 3 - Method of Aquiring: friends 3 - Route of Substance Use: snort                   ASAM's:  Six Dimensions of Multidimensional Assessment  Dimension 1:  Acute Intoxication and/or Withdrawal Potential:   Dimension 1:  Description of individual's past and current experiences of substance use and withdrawal: previous ED visits and has participateed in substance use groups  Dimension 2:  Biomedical Conditions and Complications:   Dimension 2:  Description of patient's biomedical conditions and  complications: Denies any heath concerns  Dimension 3:  Emotional, Behavioral, or Cognitive Conditions and Complications:  Dimension 3:  Description of emotional, behavioral, or cognitive conditions and complications: Mar shows history of anxiety and mild depression  Dimension 4:  Readiness to Change:  Dimension 4:  Description of Readiness to Change criteria: Wliing to enter treatment  Dimension 5:  Relapse, Continued use, or Continued Problem Potential:  Dimension 5:  Relapse, continued use, or continued problem potential critiera description: Mixed  Xanax and alcohol today.  Dimension 6:  Recovery/Living Environment:  Dimension 6:  Recovery/Iiving environment criteria description: Pt. hasa psotive supports in his life.  ASAM Severity Score: ASAM's Severity Rating Score: 10  ASAM Recommended Level of Treatment: ASAM Recommended Level of Treatment: Level II Intensive Outpatient Treatment   Substance use Disorder (SUD) Substance Use Disorder (SUD)  Checklist Symptoms of Substance Use: Continued use despite having a persistent/recurrent physical/psychological problem caused/exacerbated by use, Evidence of withdrawal (Comment), Persistent desire or unsuccessful efforts to cut down or control use  Recommendations for Services/Supports/Treatments: Recommendations for Services/Supports/Treatments Recommendations For Services/Supports/Treatments: Detox, Medication Management, SAIOP (Substance Abuse Intensive Outpatient Program), Residential-Level 2  Discharge Disposition:    DSM5 Diagnoses: Patient Active Problem List   Diagnosis Date Noted   Allergic rhinitis 07/02/2022   Chronic alcohol abuse 07/02/2022   Alcohol withdrawal ([place]) 07/01/2022   Alcohol dependence with alcohol-induced mood disorder (HCC)    Generalized anxiety disorder 09/07/2021   Mild depression 09/07/2021     Referrals to Alternative Service(s): Referred to Alternative Service(s):   Place:   Date:   Time:    Referred to Alternative Service(s):   Place:   Date:   Time:    Referred to Alternative Service(s):   Place:   Date:   Time:    Referred to Alternative Service(s):   Place:   Date:   Time:     Anette Riedel, LCSW

## 2023-02-20 NOTE — ED Notes (Signed)
STAT lab courier called to transport labs to [facility] lab 

## 2023-02-20 NOTE — Progress Notes (Signed)
   02/20/23 1518  [place] (Walk-ins at [facility] only)  How Did You Hear About [place]? Self  What Is the Reason for Your Visit/Call Today? Addiction Problem  How Long Has This Been Causing You Problems? 1 wk - 1 month  Have You Recently Had Any Thoughts About Hurting Yourself? No  Are You Planning to Commit Suicide/Harm Yourself At This time? No  Have you Recently Had Thoughts About [place]? No  Are You Planning To Harm Someone At This Time? No  Explanation: NA  Are you currently experiencing any auditory, visual or other hallucinations? No  Have You Used Any Alcohol or Drugs in the Past 24 Hours? Yes  How long ago did you use Drugs or Alcohol? 24 hours, Xanxa, liqour  What Did You Use and How Much? Xanax, liqour, 2 or 3oz  Do you have any current medical co-morbidities that require immediate attention? No  Clinician description of patient physical appearance/behavior: Engaged  What Do You Feel Would Help You the Most Today? Alcohol or Drug Use Treatment;Treatment for Depression or other mood problem  If access to [facility] Ur[facility] was not available, would you have sought care in the Emergency Department? Yes  Determination of Need Urgent (48 hours)  Options For Referral Facility-Based Crisis

## 2023-02-21 DIAGNOSIS — F1024 Alcohol dependence with alcohol-induced mood disorder: Secondary | ICD-10-CM | POA: Diagnosis not present

## 2023-02-21 MED ORDER — CHLORDIAZEPOXIDE HCL 25 MG PO CAPS: 25.0000 mg | ORAL_CAPSULE | ORAL | Status: DC

## 2023-02-21 MED ORDER — HYDROXYZINE PAMOATE 25 MG PO CAPS: 25.0000 mg | ORAL_CAPSULE | Freq: Three times a day (TID) | ORAL | 0 refills | Status: AC | PRN

## 2023-02-21 MED ORDER — CHLORDIAZEPOXIDE HCL 25 MG PO CAPS: 25.0000 mg | ORAL_CAPSULE | Freq: Three times a day (TID) | ORAL | Status: DC

## 2023-02-21 MED ORDER — CHLORDIAZEPOXIDE HCL 25 MG PO CAPS
50.0000 mg | ORAL_CAPSULE | Freq: Once | ORAL | Status: AC
  Administered 2023-02-21: 50 mg via ORAL
  Filled 2023-02-21: qty 2

## 2023-02-21 MED ORDER — HYDROXYZINE PAMOATE 25 MG PO CAPS: 25.0000 mg | ORAL_CAPSULE | Freq: Three times a day (TID) | ORAL | 0 refills | Status: DC | PRN

## 2023-02-21 MED ORDER — CHLORDIAZEPOXIDE HCL 25 MG PO CAPS: 25.0000 mg | ORAL_CAPSULE | Freq: Four times a day (QID) | ORAL | Status: DC

## 2023-02-21 MED ORDER — CHLORDIAZEPOXIDE HCL 25 MG PO CAPS: 25.0000 mg | ORAL_CAPSULE | Freq: Every day | ORAL | Status: DC

## 2023-02-21 NOTE — ED Notes (Signed)
Patient observed/assessed in bed/chair resting quietly appearing in no distress and verbalizing no complaints at this time. Will continue to monitor.  

## 2023-02-21 NOTE — Discharge Instructions (Addendum)
[facility] [address] [place], [place], [address] [phone] phone [phone] fax NOTE: Does have Substance Abuse-Intensive Outpatient Program [facility]) as well as transitional housing if eligible.  [facility] ([facility]) [place], [place] [address] [phone]  [facility] (private insurance) [address] [place], [place] [address] [phone] ((CD-IOP (afternoon program); individual therapy))  [facility] (Medicaid for [employer] and [employer]; some private insurance) [address], [place] [address] [phone] ((CD-IOP))  [facility] ([place] insurance) [phone] [place], [place] [address] [phone] ((CD-IOP (morning & evening programs); individual therapy))  HALFWAY HOUSES:  [facility] [place] [phone]  [employer].oxfordvacancies.com  12 STEP PROGRAMS:  Alcoholics Anonymous of [place] ReportZoo.com.cy  Narcotics Anonymous of [place] GreenScrapbooking.dk  Al-Anon of [employer], [place] www.[place]alanon.org/find-meetings.html  Nar-Anon https://nar-anon.org/find-a-meetin   List of Residential placements:   [facility]: [phone]  Benita Stabile, [place]: Male and male facility; 30-day program: (uninsured and Medicaid such as Deloria Lair, [place], [place], partners)  [place]: [phone]; men and women's facility; 28 days; Can have Medicaid tailored plan Publishing rights manager or Partners)  [place]: [phone] Janace Hoard or Jeani Hawking; 28 day program; must be fully detox; tailored Medicaid or no insurance  [facility] in [place], [place]; [phone]; 28 day all males program; no insurance accepted  [facility] Referral in [place]: Wille Glaser [phone] (no insurance or Medicaid only); 90 days; outpatient services  but provide housing in apartments downtown [place] Admission: [phone]: Patient must complete phone screening for placement: [place], [place]; 6 month program; uninsured, Medicaid, and [employer].   [facility]: no insurance required; Home: [phone]; Intake: Herbie Baltimore; Must fill out application online; Christy Sartorius Delay [phone] x [place] in [place], [place]: [phone]; Admissions Coordinators Mr. Simona Huh or Jene Every; 90 day program.  [facility]: [place], [place] [phone]; Co-Ed 9 month to a year program; Online application; Men entry fee is $500 (6-25months);  [employer]: [address] [place],  [phone]; no fee or insurance required; minimum of 2 years; Highly structured; work based; Intake Coordinator is Gerald Stabs [phone]  [facility] in [place], [place]: [phone]; Fax number is [phone]; website: www.[facility].org; Requires 3-6 page autobiography; 2 year program (18 months and then 64month transitional housing); Admission fee is $300; no insurance needed; work Facilities manager in [place], [place]: [place] Staff: Michel Bickers [phone]: They have a Men's Regenerations Program 6-65months. Free program; There is an initial $300 fee however, they are willing to work with patients regarding that. Application is online.  [facility]: Admissions [place] ext [phone]; Any 7-90 day program is out of pocket; 12 month program is free of charge; there is a $275 entry fee; Patient is responsible for own transportation

## 2023-02-21 NOTE — ED Provider Notes (Signed)
FBC/OBS ASAP Discharge Summary  Date and Time: 02/21/2023 9:48 AM  Name: [name]  MRN:  [id number]   Discharge Diagnoses:  Final diagnoses:  Alcohol dependence with alcohol-induced mood disorder ([place])    Subjective: Melaki Argumedo is a 32 year old male with a psychiatric history of depression, GAD, alcohol use disorder-severe/dependent, and alcohol-induced mood disorder who presented to [facility] urgent care voluntarily requesting detox from benzodiazepines but unsure about [facility] admission so admitted to observation unit.  Stay Summary: On AM reassessment, patient acknowledged that he was intoxicated when coming in, and he feels much better today. He reports that he has a new psychiatrist as well as an LCAS that he is to begin seeing next week; he will follow with them both monthly and bi-monthly, respectively. He would prefer to not be admitted to the [facility] but is open to CDIOP resources. As well, he says that he will return to [place].   He achieved sobriety from [place] Nov/early Dec 2023 before relapsing and again from Dec 2023-1 week ago before this relapse. His 37 year old daughter is a motivating factor for him; his grandfather struggled with alcohol use, and he does not want his daughter to have the same experience his mother did. Patient has good insight into his triggers for relapse, and states that he will work through them with his therapist; work is not a trigger for him.   Today, patient denies SI, HI, and AVH, and does not voice delusions. Of alcohol withdrawal seizures, he states that he's only had one at the age of 32. He is educated that any occurrence makes him more susceptible to recurrence, and that BZD and alcohol use together lower his seizure threshold as well; he voices understanding.   Patient states that mom will pick him up today.     Total Time spent with patient: 30 minutes  Past Psychiatric History: See H&P Past Medical History: See chart Family History: See  H&P Family Psychiatric History: See H&P Social History: See H&P Tobacco Cessation:  N/A, patient does not currently use tobacco products  Current Medications:  Current Facility-Administered Medications  Medication Dose Route Frequency Provider Last Rate Last Admin   acetaminophen (TYLENOL) tablet 650 mg  650 mg Oral Q6H PRN [place]ill [place], NP       alum & mag hydroxide-simeth (MAALOX/MYLANTA) 200-200-20 MG/5ML suspension 30 mL  30 mL Oral Q4H PRN Derrill Center, NP       chlordiazePOXIDE (LIBRIUM) capsule 25 mg  25 mg Oral Q6H PRN D[place]ill Center, NP       hydrOXYzine (ATARAX) tablet 25 mg  25 mg Oral Q6H PRN Derrill Center, NP   25 mg at 02/21/23 [id number]   loperamide (IMODIUM) capsule 2-4 mg  2-4 mg Oral PRN Derrill Center, NP       magnesium hydroxide (MILK OF MAGNESIA) suspension 30 mL  30 mL Oral Daily PRN D[place]ill Center, NP       mirtazapine (REMERON) tablet 30 mg  30 mg Oral QHS Nwoko, Uchenna E, PA   30 mg at 02/20/23 2108   multivitamin with minerals tablet 1 tablet  1 tablet Oral Daily Derrill Center, NP   1 tablet at 02/21/23 0917   ondansetron (ZOFRAN-ODT) disintegrating tablet 4 mg  4 mg Oral Q6H PRN Derrill Center, NP       QUEtiapine (SEROQUEL) tablet 25 mg  25 mg Oral QHS [place], Ene A, NP   25 mg at 02/20/23  2108   thiamine (VITAMIN B1) tablet 100 mg  100 mg Oral Daily Derrill Center, NP   100 mg at 02/21/23 [id number]   traZODone (DESYREL) tablet 50 mg  50 mg Oral QHS PRN Derrill Center, NP   50 mg at 02/20/23 2108   Current Outpatient Medications  Medication Sig Dispense Refill   LORazepam (ATIVAN) 0.5 MG tablet Take 0.5 mg by mouth 2 (two) times daily as needed for anxiety.     mirtazapine (REMERON) 30 MG tablet Take 1 tablet (30 mg total) by mouth at bedtime. 90 tablet 0   Vilazodone HCl 20 MG TABS Take 20 mg by mouth daily.     baclofen (LIORESAL) 10 MG tablet Take 10 mg by mouth 3 (three) times daily.     fluticasone (FLONASE) 50 MCG/ACT nasal spray Place 1 spray  into both nostrils 2 (two) times daily as needed for allergies.     hydrOXYzine (VISTARIL) 25 MG capsule Take 1 capsule (25 mg total) by mouth 3 (three) times daily as needed for anxiety. 90 capsule 0   lisinopril (ZESTRIL) 20 MG tablet Take 1 tablet (20 mg total) by mouth daily. 90 tablet 1   QUEtiapine (SEROQUEL) 25 MG tablet Take 1 tablet (25 mg total) by mouth at bedtime and may repeat dose one time if needed. 60 tablet 0   traZODone (DESYREL) 50 MG tablet Take 1 tablet (50 mg total) by mouth 60 (sixty) minutes before procedure for 1 dose. 60 tablet 2    PTA Medications:  Facility Ordered Medications  Medication   acetaminophen (TYLENOL) tablet 650 mg   alum & mag hydroxide-simeth (MAALOX/MYLANTA) 200-200-20 MG/5ML suspension 30 mL   magnesium hydroxide (MILK OF MAGNESIA) suspension 30 mL   traZODone (DESYREL) tablet 50 mg   [COMPLETED] thiamine (VITAMIN B1) injection 100 mg   multivitamin with minerals tablet 1 tablet   hydrOXYzine (ATARAX) tablet 25 mg   loperamide (IMODIUM) capsule 2-4 mg   ondansetron (ZOFRAN-ODT) disintegrating tablet 4 mg   chlordiazePOXIDE (LIBRIUM) capsule 25 mg   thiamine (VITAMIN B1) tablet 100 mg   mirtazapine (REMERON) tablet 30 mg   QUEtiapine (SEROQUEL) tablet 25 mg   [COMPLETED] chlordiazePOXIDE (LIBRIUM) capsule 50 mg   PTA Medications  Medication Sig   mirtazapine (REMERON) 30 MG tablet Take 1 tablet (30 mg total) by mouth at bedtime.   Vilazodone HCl 20 MG TABS Take 20 mg by mouth daily.   LORazepam (ATIVAN) 0.5 MG tablet Take 0.5 mg by mouth 2 (two) times daily as needed for anxiety.   fluticasone (FLONASE) 50 MCG/ACT nasal spray Place 1 spray into both nostrils 2 (two) times daily as needed for allergies.   traZODone (DESYREL) 50 MG tablet Take 1 tablet (50 mg total) by mouth 60 (sixty) minutes before procedure for 1 dose.   lisinopril (ZESTRIL) 20 MG tablet Take 1 tablet (20 mg total) by mouth daily.   hydrOXYzine (VISTARIL) 25 MG capsule  Take 1 capsule (25 mg total) by mouth 3 (three) times daily as needed for anxiety.   QUEtiapine (SEROQUEL) 25 MG tablet Take 1 tablet (25 mg total) by mouth at bedtime and may repeat dose one time if needed.   baclofen (LIORESAL) 10 MG tablet Take 10 mg by mouth 3 (three) times daily.       11/15/2022    2:37 PM 08/13/2022    2:33 PM 05/05/2022   11:25 AM  Depression screen PHQ 2/9  Decreased Interest 1 1 1   Down,  Depressed, Hopeless 1 1 1   PHQ - 2 Score 2 2 2   Altered sleeping 2 0 0  Tired, decreased energy 2 1 1   Change in appetite 2 1 2   Feeling bad or failure about yourself  2 0 0  Trouble concentrating 2 0 2  Moving slowly or fidgety/restless 3 1 2   Suicidal thoughts 0 0 0  PHQ-9 Score 15 5 9   Difficult doing work/chores   Not difficult at all    [place] [facility] from 02/20/2023 in [facility] Most recent reading at 02/20/2023  4:16 PM ED from 09/14/2022 in [facility] Most recent reading at 09/14/2022  6:47 PM OP Visit from 09/14/2022 in [place] Most recent reading at 09/14/2022  5:04 PM  C-SSRS RISK CATEGORY No Risk No Risk No Risk       Musculoskeletal  Strength & Muscle Tone: within normal limits Gait & Station: normal Patient leans: N/A  Psychiatric Specialty Exam  Presentation  General Appearance:  Appropriate for Environment; Casual  Eye Contact: Good  Speech: Clear and Coherent; Normal Rate  Speech Volume: Normal  Handedness: Right   Mood and Affect  Mood: Euthymic  Affect: Appropriate; Congruent   Thought Process  Thought Processes: Coherent; Linear  Descriptions of Associations:Intact  Orientation:Full (Time, Place and Person)  Thought Content:Logical; WDL  Diagnosis of Schizophrenia or Schizoaffective disorder in past: No    Hallucinations:Hallucinations: None  Ideas of Reference:None  Suicidal Thoughts:Suicidal  Thoughts: No  Homicidal Thoughts:Homicidal Thoughts: No   Sensorium  Memory: Immediate Good; Recent Good  Judgment: Fair  Insight: Good   Executive Functions  Concentration: Good  Attention Span: Good  Recall: Good  Fund of Knowledge: Good  Language: Good   Psychomotor Activity  Psychomotor Activity:Psychomotor Activity: Normal   Assets  Assets: Communication Skills; Desire for Improvement; Financial Resources/Insurance; Housing; Leisure Time; Resilience; Social Support; Vocational/Educational   Sleep  Sleep:Sleep: Good    Physical Exam  Physical Exam Vitals reviewed.  Constitutional:      General: He is not in acute distress.    Appearance: He is not toxic-appearing.  HENT:     Head: Normocephalic and atraumatic.  Pulmonary:     Effort: Pulmonary effort is normal.  Neurological:     General: No focal deficit present.     Mental Status: He is alert and oriented to person, place, and time.     Motor: No weakness.     Gait: Gait normal.    Review of Systems  Constitutional:  Negative for diaphoresis.  Neurological:  Negative for tremors, seizures and headaches.  Psychiatric/Behavioral:  Positive for substance abuse. Negative for hallucinations and suicidal ideas.    Blood pressure 117/78, pulse 67, temperature 97.8 F (36.6 C), temperature source Oral, resp. rate 18, SpO2 100 %. There is no height or weight on file to calculate BMI.  Demographic Factors:  Male and Caucasian  Loss Factors: Legal issues; driving privileges revoked  Historical Factors: Family history of mental illness or substance abuse  Risk Reduction Factors:   Responsible for children under 3 years of age, Sense of responsibility to family, Employed, Living with another person, especially a relative, Positive social support, and Positive therapeutic relationship  Continued Clinical Symptoms:  Alcohol/Substance Abuse/Dependencies  Cognitive Features That Contribute  To Risk:  None    Suicide Risk:  Minimal: No identifiable suicidal ideation.  Patients presenting with no risk factors but with morbid ruminations;  may be classified as minimal risk based on the severity of the depressive symptoms  Plan Of Care/Follow-up recommendations:  Follow-up recommendations:  Activity:  Normal, as tolerated Diet:  Per PCP recommendation  Patient is instructed prior to discharge to: Take all medications as prescribed by his mental healthcare provider. Report any adverse effects and/or reactions from the medicines to his outpatient provider promptly. Patient has been instructed & cautioned: To not engage in alcohol and or illegal drug use while on prescription medicines.  In the event of worsening symptoms, patient is instructed to call the crisis hotline at 988, 911 and or go to the nearest ED for appropriate evaluation and treatment of symptoms. To follow-up with his primary care provider for your other medical issues, concerns and or health care needs.   Disposition: Home; resources for CDIOP placed in AVS.  Rosezetta Schlatter, MD 02/21/2023, 9:48 AM

## 2023-02-21 NOTE — ED Notes (Signed)
Pt is currently sleeping, no distress noted, environmental check complete, will continue to monitor patient for safety. ? ?

## 2023-03-17 ENCOUNTER — Ambulatory Visit: Payer: Commercial Managed Care - HMO | Admitting: Internal Medicine
